# Patient Record
Sex: Female | Born: 1945 | Race: White | Hispanic: No | Smoking: Never smoker
Health system: Southern US, Community
[De-identification: ages and names within clinical notes are randomized; demographics above are authoritative.]

## PROBLEM LIST (undated history)

## (undated) DIAGNOSIS — I340 Nonrheumatic mitral (valve) insufficiency: Secondary | ICD-10-CM

## (undated) DIAGNOSIS — M4800 Spinal stenosis, site unspecified: Secondary | ICD-10-CM

## (undated) DIAGNOSIS — E785 Hyperlipidemia, unspecified: Secondary | ICD-10-CM

## (undated) DIAGNOSIS — I739 Peripheral vascular disease, unspecified: Secondary | ICD-10-CM

## (undated) DIAGNOSIS — L409 Psoriasis, unspecified: Secondary | ICD-10-CM

## (undated) DIAGNOSIS — I10 Essential (primary) hypertension: Secondary | ICD-10-CM

## (undated) DIAGNOSIS — I5189 Other ill-defined heart diseases: Secondary | ICD-10-CM

## (undated) DIAGNOSIS — F419 Anxiety disorder, unspecified: Secondary | ICD-10-CM

## (undated) DIAGNOSIS — I272 Pulmonary hypertension, unspecified: Secondary | ICD-10-CM

## (undated) DIAGNOSIS — S0922XA Traumatic rupture of left ear drum, initial encounter: Secondary | ICD-10-CM

## (undated) DIAGNOSIS — U071 COVID-19: Secondary | ICD-10-CM

## (undated) HISTORY — PX: SHOULDER SURGERY: SHX246

## (undated) HISTORY — DX: Peripheral vascular disease, unspecified: I73.9

## (undated) HISTORY — DX: Pulmonary hypertension, unspecified: I27.20

## (undated) HISTORY — PX: VAGINAL HYSTERECTOMY: SUR661

## (undated) HISTORY — PX: KNEE ARTHROSCOPY: SUR90

## (undated) HISTORY — PX: ROTATOR CUFF REPAIR W/ DISTAL CLAVICLE EXCISION: SHX2365

## (undated) HISTORY — DX: Essential (primary) hypertension: I10

## (undated) HISTORY — DX: Nonrheumatic mitral (valve) insufficiency: I34.0

## (undated) HISTORY — DX: Other ill-defined heart diseases: I51.89

## (undated) HISTORY — DX: COVID-19: U07.1

## (undated) HISTORY — DX: Traumatic rupture of left ear drum, initial encounter: S09.22XA

## (undated) HISTORY — DX: Hyperlipidemia, unspecified: E78.5

## (undated) HISTORY — PX: KNEE SURGERY: SHX244

## (undated) HISTORY — PX: SKIN CANCER EXCISION: SHX779

## (undated) HISTORY — DX: Psoriasis, unspecified: L40.9

## (undated) HISTORY — DX: Anxiety disorder, unspecified: F41.9

## (undated) HISTORY — PX: MOHS SURGERY: SUR867

## (undated) HISTORY — DX: Spinal stenosis, site unspecified: M48.00

## (undated) HISTORY — PX: BUNIONECTOMY: SHX129

## (undated) HISTORY — PX: TOTAL KNEE ARTHROPLASTY: SHX125

## (undated) HISTORY — PX: BASAL CELL CARCINOMA EXCISION: SHX1214

---

## 1978-12-14 HISTORY — PX: ABDOMINAL HYSTERECTOMY: SHX81

## 1990-12-14 HISTORY — PX: BUNIONECTOMY: SHX129

## 2013-09-19 DIAGNOSIS — Z23 Encounter for immunization: Secondary | ICD-10-CM | POA: Diagnosis not present

## 2013-12-18 DIAGNOSIS — D1801 Hemangioma of skin and subcutaneous tissue: Secondary | ICD-10-CM | POA: Diagnosis not present

## 2013-12-18 DIAGNOSIS — Z85828 Personal history of other malignant neoplasm of skin: Secondary | ICD-10-CM | POA: Diagnosis not present

## 2013-12-18 DIAGNOSIS — L708 Other acne: Secondary | ICD-10-CM | POA: Diagnosis not present

## 2013-12-18 DIAGNOSIS — L819 Disorder of pigmentation, unspecified: Secondary | ICD-10-CM | POA: Diagnosis not present

## 2013-12-19 ENCOUNTER — Ambulatory Visit: Payer: Self-pay | Admitting: Internal Medicine

## 2014-01-05 ENCOUNTER — Ambulatory Visit (INDEPENDENT_AMBULATORY_CARE_PROVIDER_SITE_OTHER): Payer: Medicare Other | Admitting: Internal Medicine

## 2014-01-05 ENCOUNTER — Encounter: Payer: Self-pay | Admitting: Internal Medicine

## 2014-01-05 VITALS — BP 128/74 | HR 59 | Temp 97.9°F | Ht 60.5 in | Wt 161.5 lb

## 2014-01-05 DIAGNOSIS — K219 Gastro-esophageal reflux disease without esophagitis: Secondary | ICD-10-CM

## 2014-01-05 DIAGNOSIS — E785 Hyperlipidemia, unspecified: Secondary | ICD-10-CM | POA: Diagnosis not present

## 2014-01-05 DIAGNOSIS — Z23 Encounter for immunization: Secondary | ICD-10-CM

## 2014-01-05 DIAGNOSIS — I10 Essential (primary) hypertension: Secondary | ICD-10-CM | POA: Diagnosis not present

## 2014-01-05 DIAGNOSIS — Z780 Asymptomatic menopausal state: Secondary | ICD-10-CM

## 2014-01-05 DIAGNOSIS — E669 Obesity, unspecified: Secondary | ICD-10-CM | POA: Diagnosis not present

## 2014-01-05 DIAGNOSIS — Z1239 Encounter for other screening for malignant neoplasm of breast: Secondary | ICD-10-CM

## 2014-01-05 LAB — COMPREHENSIVE METABOLIC PANEL
ALT: 19 U/L (ref 0–35)
AST: 21 U/L (ref 0–37)
Albumin: 4.1 g/dL (ref 3.5–5.2)
Alkaline Phosphatase: 61 U/L (ref 39–117)
BUN: 18 mg/dL (ref 6–23)
CALCIUM: 9.2 mg/dL (ref 8.4–10.5)
CHLORIDE: 101 meq/L (ref 96–112)
CO2: 29 meq/L (ref 19–32)
CREATININE: 1 mg/dL (ref 0.4–1.2)
GFR: 57.94 mL/min — AB (ref >60.00)
Glucose, Bld: 89 mg/dL (ref 70–99)
Potassium: 4.5 mEq/L (ref 3.5–5.1)
Sodium: 137 mEq/L (ref 135–145)
Total Bilirubin: 0.6 mg/dL (ref 0.3–1.2)
Total Protein: 7.8 g/dL (ref 6.0–8.3)

## 2014-01-05 LAB — LIPID PANEL
Cholesterol: 212 mg/dL — ABNORMAL HIGH (ref 0–200)
HDL: 46.1 mg/dL (ref >39.00)
TRIGLYCERIDES: 267 mg/dL — AB (ref 0.0–149.0)
Total CHOL/HDL Ratio: 5
VLDL: 53.4 mg/dL — ABNORMAL HIGH (ref 0.0–40.0)

## 2014-01-05 LAB — CBC
HCT: 37.3 % (ref 36.0–46.0)
HEMOGLOBIN: 12.6 g/dL (ref 12.0–15.0)
MCHC: 33.7 g/dL (ref 30.0–36.0)
MCV: 97.5 fl (ref 78.0–100.0)
PLATELETS: 112 10*3/uL — AB (ref 150.0–400.0)
RBC: 3.83 Mil/uL — ABNORMAL LOW (ref 3.87–5.11)
RDW: 12.8 % (ref 11.5–14.6)
WBC: 8.1 10*3/uL (ref 4.5–10.5)

## 2014-01-05 LAB — LDL CHOLESTEROL, DIRECT: Direct LDL: 136.2 mg/dL

## 2014-01-05 NOTE — Assessment & Plan Note (Signed)
Well controlled on current therapy Will check CBC and CMET today 

## 2014-01-05 NOTE — Progress Notes (Signed)
Pre-visit discussion using our clinic review tool. No additional management support is needed unless otherwise documented below in the visit note.  

## 2014-01-05 NOTE — Addendum Note (Signed)
Addended by: Lurlean Nanny on: 01/05/2014 12:34 PM   Modules accepted: Orders

## 2014-01-05 NOTE — Assessment & Plan Note (Signed)
On simvistatin and tolerating it well Will check lipid profile and CMET today Continue to work on diet and exercise

## 2014-01-05 NOTE — Assessment & Plan Note (Signed)
Well controlled on Nexium Medication refilled today

## 2014-01-05 NOTE — Assessment & Plan Note (Signed)
Encouraged her to work on diet and exercise 

## 2014-01-05 NOTE — Progress Notes (Signed)
HPI  Pt presents to the clinic today to establish care. She moved from [place] last march. She is transferring care from [facility], [facility], [place].  Flu: 09/13/2013 Tetanus: unsure of date Pneumovax: 2013 Zostovax: never Pap Smear: 2009-normal Mammogram: 2009- normal Colon Screening: never Bone Density: never Eye Doctor: as needed Dentist: no- dentures  Past Medical History  Diagnosis Date  . Hypertension   . Hyperlipidemia   . Anxiety     Current Outpatient Prescriptions  Medication Sig Dispense Refill  . aspirin 81 MG tablet Take 81 mg by mouth daily.      Marland Kitchen atenolol (TENORMIN) 25 MG tablet Take 25 mg by mouth daily.      Marland Kitchen esomeprazole (NEXIUM) 40 MG capsule Take 40 mg by mouth daily at 12 noon.      Marland Kitchen ibuprofen (ADVIL,MOTRIN) 800 MG tablet Take 800 mg by mouth every 8 (eight) hours as needed.      Marland Kitchen LORazepam (ATIVAN) 0.5 MG tablet Take 0.5 mg by mouth every 8 (eight) hours.      . meloxicam (MOBIC) 7.5 MG tablet Take 7.5 mg by mouth 2 (two) times daily as needed for pain.      . simvastatin (ZOCOR) 20 MG tablet Take 20 mg by mouth daily.      . traMADol (ULTRAM) 50 MG tablet Take 100 mg by mouth every 6 (six) hours as needed.      . triamterene-hydrochlorothiazide (DYAZIDE) 37.5-25 MG per capsule Take 1 capsule by mouth daily.       No current facility-administered medications for this visit.    No Known Allergies  Family History  Problem Relation Age of Onset  . Cancer Mother   . Hypertension Sister     History   Social History  . Marital Status: Widowed    Spouse Name: N/A    Number of Children: N/A  . Years of Education: N/A   Occupational History  . Not on file.   Social History Main Topics  . Smoking status: Never Smoker   . Smokeless tobacco: Never Used  . Alcohol Use: No  . Drug Use: No  . Sexual Activity: Not on file   Other Topics Concern  . Not on file   Social History Narrative  . No narrative on file     ROS:  Constitutional: Denies fever, malaise, fatigue, headache or abrupt weight changes.  HEENT: Denies eye pain, eye redness, ear pain, ringing in the ears, wax buildup, runny nose, nasal congestion, bloody nose, or sore throat. Respiratory: Denies difficulty breathing, shortness of breath, cough or sputum production.   Cardiovascular: Denies chest pain, chest tightness, palpitations or swelling in the hands or feet.  Gastrointestinal: Denies abdominal pain, bloating, constipation, diarrhea or blood in the stool.  GU: Denies frequency, urgency, pain with urination, blood in urine, odor or discharge. Musculoskeletal: Pt reports multiple joint pains. Denies decrease in range of motion, difficulty with gait, muscle pain.  Skin: Denies redness, rashes, lesions or ulcercations.  Neurological: Denies dizziness, difficulty with memory, difficulty with speech or problems with balance and coordination.   No other specific complaints in a complete review of systems (except as listed in HPI above).  PE:  BP 128/74  Pulse 59  Temp(Src) 97.9 F (36.6 C) (Oral)  Ht 5' 0.5" (1.537 m)  Wt 161 lb 8 oz (73.256 kg)  BMI 31.01 kg/m2  SpO2 98% Wt Readings from Last 3 Encounters:  01/05/14 161 lb 8 oz (73.256 kg)  General: Appears her stated age, overweight well developed, well nourished in NAD. Cardiovascular: Normal rate and rhythm. S1,S2 noted.  No murmur, rubs or gallops noted. No JVD or BLE edema. No carotid bruits noted. Pulmonary/Chest: Normal effort and positive vesicular breath sounds. No respiratory distress. No wheezes, rales or ronchi noted.    Assessment and Plan:  Preventative Health Maintenance:  Advised her to work on diet and exercise Td given today Referral place for mammogram and bone density exam She declines setting up her colonoscopy Encouraged her to make an appt with an eye doctor She will call her insurance company to find out about the shingles vaccine  RTC in  6 months or sooner if needed

## 2014-01-05 NOTE — Patient Instructions (Signed)

## 2014-01-08 ENCOUNTER — Telehealth: Payer: Self-pay | Admitting: Internal Medicine

## 2014-01-08 NOTE — Telephone Encounter (Signed)
Relevant patient education assigned to patient using Emmi. ° °

## 2014-01-19 ENCOUNTER — Other Ambulatory Visit: Payer: Self-pay | Admitting: Internal Medicine

## 2014-01-19 ENCOUNTER — Other Ambulatory Visit: Payer: Self-pay

## 2014-01-19 DIAGNOSIS — Z1231 Encounter for screening mammogram for malignant neoplasm of breast: Secondary | ICD-10-CM

## 2014-01-19 NOTE — Telephone Encounter (Signed)
Pt left v/m requesting refill Nexium to [facility]; [name] NP has never prescribed. Pt had gotten from [place] doctor in past. Pt request cb when refilled.

## 2014-01-22 MED ORDER — ESOMEPRAZOLE MAGNESIUM 40 MG PO CPDR: 40.0000 mg | DELAYED_RELEASE_CAPSULE | Freq: Every day | ORAL | Status: DC

## 2014-01-22 NOTE — Telephone Encounter (Signed)
Ok to refill 

## 2014-02-08 ENCOUNTER — Ambulatory Visit: Payer: Medicare Other

## 2014-02-08 ENCOUNTER — Other Ambulatory Visit: Payer: Medicare Other

## 2014-02-28 ENCOUNTER — Ambulatory Visit
Admission: RE | Admit: 2014-02-28 | Discharge: 2014-02-28 | Disposition: A | Discharge status: Home / Self Care | Payer: Medicare Other | Source: Ambulatory Visit | Attending: Internal Medicine | Admitting: Internal Medicine

## 2014-02-28 DIAGNOSIS — Z1231 Encounter for screening mammogram for malignant neoplasm of breast: Secondary | ICD-10-CM | POA: Diagnosis not present

## 2014-02-28 DIAGNOSIS — M899 Disorder of bone, unspecified: Secondary | ICD-10-CM | POA: Diagnosis not present

## 2014-02-28 DIAGNOSIS — Z780 Asymptomatic menopausal state: Secondary | ICD-10-CM

## 2014-03-20 ENCOUNTER — Telehealth: Payer: Self-pay

## 2014-03-20 ENCOUNTER — Other Ambulatory Visit: Payer: Self-pay

## 2014-03-20 MED ORDER — ATENOLOL 25 MG PO TABS: 25.0000 mg | ORAL_TABLET | Freq: Every day | ORAL | Status: DC

## 2014-03-20 MED ORDER — SIMVASTATIN 20 MG PO TABS: 20.0000 mg | ORAL_TABLET | Freq: Every day | ORAL | Status: DC

## 2014-03-20 NOTE — Telephone Encounter (Signed)
Pt called requesting medication refill--you have never prescribed these medications and wanted verification that it is okay to refill--pt had labs and OV with you 12/2013--please advise

## 2014-03-20 NOTE — Telephone Encounter (Signed)
Called to let pt know that her bone density scan showed mild bone loss and she needs to start OTC Vitamin D 2000 units daily and calcium supplement daily

## 2014-04-05 DIAGNOSIS — H2510 Age-related nuclear cataract, unspecified eye: Secondary | ICD-10-CM | POA: Diagnosis not present

## 2014-05-11 ENCOUNTER — Other Ambulatory Visit: Payer: Self-pay | Admitting: Internal Medicine

## 2014-05-14 DIAGNOSIS — M1710 Unilateral primary osteoarthritis, unspecified knee: Secondary | ICD-10-CM | POA: Diagnosis not present

## 2014-05-14 DIAGNOSIS — M179 Osteoarthritis of knee, unspecified: Secondary | ICD-10-CM | POA: Insufficient documentation

## 2014-05-14 DIAGNOSIS — IMO0002 Reserved for concepts with insufficient information to code with codable children: Secondary | ICD-10-CM | POA: Diagnosis not present

## 2014-05-14 DIAGNOSIS — M25569 Pain in unspecified knee: Secondary | ICD-10-CM | POA: Diagnosis not present

## 2014-05-16 DIAGNOSIS — H2510 Age-related nuclear cataract, unspecified eye: Secondary | ICD-10-CM | POA: Diagnosis not present

## 2014-05-16 DIAGNOSIS — Q141 Congenital malformation of retina: Secondary | ICD-10-CM | POA: Diagnosis not present

## 2014-05-16 DIAGNOSIS — H18419 Arcus senilis, unspecified eye: Secondary | ICD-10-CM | POA: Diagnosis not present

## 2014-05-16 DIAGNOSIS — I10 Essential (primary) hypertension: Secondary | ICD-10-CM | POA: Diagnosis not present

## 2014-05-17 ENCOUNTER — Encounter (INDEPENDENT_AMBULATORY_CARE_PROVIDER_SITE_OTHER): Payer: Medicare Other | Admitting: Ophthalmology

## 2014-05-17 DIAGNOSIS — I10 Essential (primary) hypertension: Secondary | ICD-10-CM | POA: Diagnosis not present

## 2014-05-17 DIAGNOSIS — H35039 Hypertensive retinopathy, unspecified eye: Secondary | ICD-10-CM

## 2014-05-17 DIAGNOSIS — H43819 Vitreous degeneration, unspecified eye: Secondary | ICD-10-CM | POA: Diagnosis not present

## 2014-05-17 DIAGNOSIS — H4420 Degenerative myopia, unspecified eye: Secondary | ICD-10-CM

## 2014-05-17 DIAGNOSIS — H2510 Age-related nuclear cataract, unspecified eye: Secondary | ICD-10-CM

## 2014-06-11 DIAGNOSIS — IMO0002 Reserved for concepts with insufficient information to code with codable children: Secondary | ICD-10-CM | POA: Diagnosis not present

## 2014-06-11 DIAGNOSIS — M1710 Unilateral primary osteoarthritis, unspecified knee: Secondary | ICD-10-CM | POA: Diagnosis not present

## 2014-06-13 DIAGNOSIS — H2510 Age-related nuclear cataract, unspecified eye: Secondary | ICD-10-CM | POA: Diagnosis not present

## 2014-06-13 DIAGNOSIS — H269 Unspecified cataract: Secondary | ICD-10-CM | POA: Diagnosis not present

## 2014-06-14 DIAGNOSIS — Z961 Presence of intraocular lens: Secondary | ICD-10-CM | POA: Diagnosis not present

## 2014-06-14 DIAGNOSIS — H25049 Posterior subcapsular polar age-related cataract, unspecified eye: Secondary | ICD-10-CM | POA: Diagnosis not present

## 2014-06-14 DIAGNOSIS — H2510 Age-related nuclear cataract, unspecified eye: Secondary | ICD-10-CM | POA: Diagnosis not present

## 2014-06-14 DIAGNOSIS — H25019 Cortical age-related cataract, unspecified eye: Secondary | ICD-10-CM | POA: Diagnosis not present

## 2014-06-18 DIAGNOSIS — M1710 Unilateral primary osteoarthritis, unspecified knee: Secondary | ICD-10-CM | POA: Diagnosis not present

## 2014-06-18 DIAGNOSIS — IMO0002 Reserved for concepts with insufficient information to code with codable children: Secondary | ICD-10-CM | POA: Diagnosis not present

## 2014-06-20 DIAGNOSIS — H269 Unspecified cataract: Secondary | ICD-10-CM | POA: Diagnosis not present

## 2014-06-20 DIAGNOSIS — H2510 Age-related nuclear cataract, unspecified eye: Secondary | ICD-10-CM | POA: Diagnosis not present

## 2014-06-25 DIAGNOSIS — IMO0002 Reserved for concepts with insufficient information to code with codable children: Secondary | ICD-10-CM | POA: Diagnosis not present

## 2014-06-25 DIAGNOSIS — M1710 Unilateral primary osteoarthritis, unspecified knee: Secondary | ICD-10-CM | POA: Diagnosis not present

## 2014-07-11 DIAGNOSIS — Z23 Encounter for immunization: Secondary | ICD-10-CM | POA: Diagnosis not present

## 2014-08-07 ENCOUNTER — Other Ambulatory Visit: Payer: Self-pay | Admitting: Internal Medicine

## 2014-08-08 NOTE — Telephone Encounter (Signed)
Pt est care with you 12/2013--no upcoming appts as i made a not that pt needs to make appt--you noted to f/u 6 months--this medication has never been prescribed by you please advise

## 2014-08-08 NOTE — Telephone Encounter (Signed)
Agree, needs follow up first

## 2014-08-10 NOTE — Telephone Encounter (Signed)
Pt stated she had surgery on her eyes and other things have come up so she forgot to make appt--pt made f/u appt for 08/13/14--Monday--i told her to make sure she is fasting just in case you wanted blood work as she has been taking the fish oil as you directed

## 2014-08-13 ENCOUNTER — Ambulatory Visit (INDEPENDENT_AMBULATORY_CARE_PROVIDER_SITE_OTHER): Payer: Medicare Other | Admitting: Internal Medicine

## 2014-08-13 ENCOUNTER — Encounter: Payer: Self-pay | Admitting: Internal Medicine

## 2014-08-13 VITALS — BP 140/78 | HR 76 | Temp 98.2°F | Wt 169.0 lb

## 2014-08-13 DIAGNOSIS — K219 Gastro-esophageal reflux disease without esophagitis: Secondary | ICD-10-CM

## 2014-08-13 DIAGNOSIS — F411 Generalized anxiety disorder: Secondary | ICD-10-CM | POA: Diagnosis not present

## 2014-08-13 DIAGNOSIS — Z23 Encounter for immunization: Secondary | ICD-10-CM | POA: Diagnosis not present

## 2014-08-13 DIAGNOSIS — E785 Hyperlipidemia, unspecified: Secondary | ICD-10-CM | POA: Diagnosis not present

## 2014-08-13 DIAGNOSIS — I10 Essential (primary) hypertension: Secondary | ICD-10-CM | POA: Diagnosis not present

## 2014-08-13 DIAGNOSIS — E669 Obesity, unspecified: Secondary | ICD-10-CM

## 2014-08-13 LAB — COMPREHENSIVE METABOLIC PANEL
ALT: 23 U/L (ref 0–35)
AST: 22 U/L (ref 0–37)
Albumin: 3.8 g/dL (ref 3.5–5.2)
Alkaline Phosphatase: 55 U/L (ref 39–117)
BUN: 14 mg/dL (ref 6–23)
CALCIUM: 9.1 mg/dL (ref 8.4–10.5)
CHLORIDE: 100 meq/L (ref 96–112)
CO2: 24 meq/L (ref 19–32)
CREATININE: 0.9 mg/dL (ref 0.4–1.2)
GFR: 66.92 mL/min (ref >60.00)
Glucose, Bld: 90 mg/dL (ref 70–99)
Potassium: 4.4 mEq/L (ref 3.5–5.1)
Sodium: 135 mEq/L (ref 135–145)
Total Bilirubin: 0.5 mg/dL (ref 0.2–1.2)
Total Protein: 7.6 g/dL (ref 6.0–8.3)

## 2014-08-13 LAB — LIPID PANEL
CHOLESTEROL: 199 mg/dL (ref 0–200)
HDL: 43.4 mg/dL (ref >39.00)
LDL Cholesterol: 119 mg/dL — ABNORMAL HIGH (ref 0–99)
NonHDL: 155.6
TRIGLYCERIDES: 184 mg/dL — AB (ref 0.0–149.0)
Total CHOL/HDL Ratio: 5
VLDL: 36.8 mg/dL (ref 0.0–40.0)

## 2014-08-13 LAB — CBC
HEMATOCRIT: 36.2 % (ref 36.0–46.0)
HEMOGLOBIN: 12.2 g/dL (ref 12.0–15.0)
MCHC: 33.7 g/dL (ref 30.0–36.0)
MCV: 98.7 fl (ref 78.0–100.0)
PLATELETS: 191 10*3/uL (ref 150.0–400.0)
RBC: 3.67 Mil/uL — AB (ref 3.87–5.11)
RDW: 12.6 % (ref 11.5–15.5)
WBC: 6.7 10*3/uL (ref 4.0–10.5)

## 2014-08-13 MED ORDER — LORAZEPAM 0.5 MG PO TABS: 0.5000 mg | ORAL_TABLET | Freq: Three times a day (TID) | ORAL | Status: DC

## 2014-08-13 MED ORDER — TRIAMTERENE-HCTZ 37.5-25 MG PO CAPS: 1.0000 | ORAL_CAPSULE | Freq: Every day | ORAL | Status: DC

## 2014-08-13 NOTE — Progress Notes (Signed)
Pre visit review using our clinic review tool, if applicable. No additional management support is needed unless otherwise documented below in the visit note. 

## 2014-08-13 NOTE — Assessment & Plan Note (Signed)
Well controlled on atenolol and dyazide Medications refilled today per request Will repeat CBC and CMET today

## 2014-08-13 NOTE — Assessment & Plan Note (Signed)
Slightly worse but se reports she has started taking the fish oil and been watching her diet Will repeat lipid profile and CMET today If still not at goal, will increase simvastatin to 40 mg daily

## 2014-08-13 NOTE — Assessment & Plan Note (Signed)
Recent increase in weight Encourage her to pay close attention to her diet Also advised her to start exercising at least 3-4 days per week for at least 30 minutes

## 2014-08-13 NOTE — Progress Notes (Signed)
Subjective:    Patient ID: Kristen Powell, female    DOB: 1946/10/17, 68 y.o.   MRN: [id number]  HPI  Pt presents to the clinic today for 6 month followup. She will need a flu shot today.  HTN: Well controlled on atenolol and dyazide. BP slightly elevated today. She reports that she has not taken her blood pressure medication this morning.  HLD: Labs from 12/2013 showed elevated triglycerides and slightly elevated LDL despite simvistatin. She was encouraged to consume a low fat, low cholesterol diet and add fish oil to her regimen.  GERD: No issues on nexium.  Obesity: She has gained 7.5 lbs in the last 7 months.  Anxiety: Takes 1 at night to help her sleeps. May take 1/2 tab during the day if really anxious.  Review of Systems      Past Medical History  Diagnosis Date  . Hypertension   . Hyperlipidemia   . Anxiety     Current Outpatient Prescriptions  Medication Sig Dispense Refill  . aspirin 81 MG tablet Take 81 mg by mouth daily.      Marland Kitchen atenolol (TENORMIN) 25 MG tablet Take 1 tablet (25 mg total) by mouth daily.  30 tablet  5  . ibuprofen (ADVIL,MOTRIN) 800 MG tablet Take 800 mg by mouth every 8 (eight) hours as needed.      Marland Kitchen LORazepam (ATIVAN) 0.5 MG tablet Take 0.5 mg by mouth every 8 (eight) hours.      . meloxicam (MOBIC) 7.5 MG tablet Take 7.5 mg by mouth 2 (two) times daily as needed for pain.      Marland Kitchen NEXIUM 40 MG capsule TAKE 1 CAPSULE (40 MG TOTAL) BY MOUTH DAILY AT 12 NOON.  30 capsule  3  . simvastatin (ZOCOR) 20 MG tablet Take 1 tablet (20 mg total) by mouth daily.  30 tablet  5  . traMADol (ULTRAM) 50 MG tablet Take 100 mg by mouth every 6 (six) hours as needed.      . triamterene-hydrochlorothiazide (DYAZIDE) 37.5-25 MG per capsule Take 1 capsule by mouth daily.       No current facility-administered medications for this visit.    No Known Allergies  Family History  Problem Relation Age of Onset  . Cancer Mother     lung  . Hypertension Sister      History   Social History  . Marital Status: Widowed    Spouse Name: N/A    Number of Children: N/A  . Years of Education: N/A   Occupational History  . Not on file.   Social History Main Topics  . Smoking status: Never Smoker   . Smokeless tobacco: Never Used  . Alcohol Use: No  . Drug Use: No  . Sexual Activity: Not on file   Other Topics Concern  . Not on file   Social History Narrative  . No narrative on file     Constitutional: Denies fever, malaise, fatigue, headache or abrupt weight changes.  Respiratory: Denies difficulty breathing, shortness of breath, cough or sputum production.   Cardiovascular: Denies chest pain, chest tightness, palpitations or swelling in the hands or feet.  Gastrointestinal: Denies abdominal pain, bloating, constipation, diarrhea or blood in the stool.  Neurological: Denies dizziness, difficulty with memory, difficulty with speech or problems with balance and coordination.   No other specific complaints in a complete review of systems (except as listed in HPI above).  Objective:   Physical Exam   BP 140/78  Pulse  76  Temp(Src) 98.2 F (36.8 C) (Oral)  Wt 169 lb (76.658 kg)  SpO2 97% Wt Readings from Last 3 Encounters:  08/13/14 169 lb (76.658 kg)  01/05/14 161 lb 8 oz (73.256 kg)    General: Appears her stated age, well developed, well nourished in NAD. Cardiovascular: Normal rate and rhythm. S1,S2 noted.  No murmur, rubs or gallops noted.  Pulmonary/Chest: Normal effort and positive vesicular breath sounds. No respiratory distress. No wheezes, rales or ronchi noted.  Abdomen: Soft and nontender. Normal bowel sounds, no bruits noted. No distention or masses noted. Liver, spleen and kidneys non palpable. Neurological: Alert and oriented.  Psychiatric: Mood and affect normal. Behavior is normal. Judgment and thought content normal.    BMET    Component Value Date/Time   NA 137 01/05/2014 1030   K 4.5 01/05/2014 1030   CL  101 01/05/2014 1030   CO2 29 01/05/2014 1030   GLUCOSE 89 01/05/2014 1030   BUN 18 01/05/2014 1030   CREATININE 1.0 01/05/2014 1030   CALCIUM 9.2 01/05/2014 1030    Lipid Panel     Component Value Date/Time   CHOL 212* 01/05/2014 1030   TRIG 267.0* 01/05/2014 1030   HDL 46.10 01/05/2014 1030   CHOLHDL 5 01/05/2014 1030   VLDL 53.4* 01/05/2014 1030    CBC    Component Value Date/Time   WBC 8.1 01/05/2014 1030   RBC 3.83* 01/05/2014 1030   HGB 12.6 01/05/2014 1030   HCT 37.3 01/05/2014 1030   PLT 112.0* 01/05/2014 1030   MCV 97.5 01/05/2014 1030   MCHC 33.7 01/05/2014 1030   RDW 12.8 01/05/2014 1030    Hgb A1C No results found for this basename: HGBA1C        Assessment & Plan:   Flu shot today  RTC in 6 months for follow up

## 2014-08-13 NOTE — Assessment & Plan Note (Signed)
Ativan refilled today She will complete UDS today

## 2014-08-13 NOTE — Addendum Note (Signed)
Addended by: Lurlean Nanny on: 08/13/2014 10:03 AM   Modules accepted: Orders

## 2014-08-13 NOTE — Patient Instructions (Signed)
Fat and Cholesterol Control Diet Fat and cholesterol levels in your blood and organs are influenced by your diet. High levels of fat and cholesterol may lead to diseases of the heart, small and large blood vessels, gallbladder, liver, and pancreas. CONTROLLING FAT AND CHOLESTEROL WITH DIET Although exercise and lifestyle factors are important, your diet is key. That is because certain foods are known to raise cholesterol and others to lower it. The goal is to balance foods for their effect on cholesterol and more importantly, to replace saturated and trans fat with other types of fat, such as monounsaturated fat, polyunsaturated fat, and omega-3 fatty acids. On average, a person should consume no more than 15 to 17 g of saturated fat daily. Saturated and trans fats are considered "bad" fats, and they will raise LDL cholesterol. Saturated fats are primarily found in animal products such as meats, butter, and cream. However, that does not mean you need to give up all your favorite foods. Today, there are good tasting, low-fat, low-cholesterol substitutes for most of the things you like to eat. Choose low-fat or nonfat alternatives. Choose round or loin cuts of red meat. These types of cuts are lowest in fat and cholesterol. Chicken (without the skin), fish, veal, and ground turkey breast are great choices. Eliminate fatty meats, such as hot dogs and salami. Even shellfish have little or no saturated fat. Have a 3 oz (85 g) portion when you eat lean meat, poultry, or fish. Trans fats are also called "partially hydrogenated oils." They are oils that have been scientifically manipulated so that they are solid at room temperature resulting in a longer shelf life and improved taste and texture of foods in which they are added. Trans fats are found in stick margarine, some tub margarines, cookies, crackers, and baked goods.  When baking and cooking, oils are a great substitute for butter. The monounsaturated oils are  especially beneficial since it is believed they lower LDL and raise HDL. The oils you should avoid entirely are saturated tropical oils, such as coconut and palm.  Remember to eat a lot from food groups that are naturally free of saturated and trans fat, including fish, fruit, vegetables, beans, grains (barley, rice, couscous, bulgur wheat), and pasta (without cream sauces).  IDENTIFYING FOODS THAT LOWER FAT AND CHOLESTEROL  Soluble fiber may lower your cholesterol. This type of fiber is found in fruits such as apples, vegetables such as broccoli, potatoes, and carrots, legumes such as beans, peas, and lentils, and grains such as barley. Foods fortified with plant sterols (phytosterol) may also lower cholesterol. You should eat at least 2 g per day of these foods for a cholesterol lowering effect.  Read package labels to identify low-saturated fats, trans fat free, and low-fat foods at the supermarket. Select cheeses that have only 2 to 3 g saturated fat per ounce. Use a heart-healthy tub margarine that is free of trans fats or partially hydrogenated oil. When buying baked goods (cookies, crackers), avoid partially hydrogenated oils. Breads and muffins should be made from whole grains (whole-wheat or whole oat flour, instead of "flour" or "enriched flour"). Buy non-creamy canned soups with reduced salt and no added fats.  FOOD PREPARATION TECHNIQUES  Never deep-fry. If you must fry, either stir-fry, which uses very little fat, or use non-stick cooking sprays. When possible, broil, bake, or roast meats, and steam vegetables. Instead of putting butter or margarine on vegetables, use lemon and herbs, applesauce, and cinnamon (for squash and sweet potatoes). Use nonfat   yogurt, salsa, and low-fat dressings for salads.  LOW-SATURATED FAT / LOW-FAT FOOD SUBSTITUTES Meats / Saturated Fat (g)  Avoid: Steak, marbled (3 oz/85 g) / 11 g  Choose: Steak, lean (3 oz/85 g) / 4 g  Avoid: Hamburger (3 oz/85 g) / 7  g  Choose: Hamburger, lean (3 oz/85 g) / 5 g  Avoid: Ham (3 oz/85 g) / 6 g  Choose: Ham, lean cut (3 oz/85 g) / 2.4 g  Avoid: Chicken, with skin, dark meat (3 oz/85 g) / 4 g  Choose: Chicken, skin removed, dark meat (3 oz/85 g) / 2 g  Avoid: Chicken, with skin, light meat (3 oz/85 g) / 2.5 g  Choose: Chicken, skin removed, light meat (3 oz/85 g) / 1 g Dairy / Saturated Fat (g)  Avoid: Whole milk (1 cup) / 5 g  Choose: Low-fat milk, 2% (1 cup) / 3 g  Choose: Low-fat milk, 1% (1 cup) / 1.5 g  Choose: Skim milk (1 cup) / 0.3 g  Avoid: Hard cheese (1 oz/28 g) / 6 g  Choose: Skim milk cheese (1 oz/28 g) / 2 to 3 g  Avoid: Cottage cheese, 4% fat (1 cup) / 6.5 g  Choose: Low-fat cottage cheese, 1% fat (1 cup) / 1.5 g  Avoid: Ice cream (1 cup) / 9 g  Choose: Sherbet (1 cup) / 2.5 g  Choose: Nonfat frozen yogurt (1 cup) / 0.3 g  Choose: Frozen fruit bar / trace  Avoid: Whipped cream (1 tbs) / 3.5 g  Choose: Nondairy whipped topping (1 tbs) / 1 g Condiments / Saturated Fat (g)  Avoid: Mayonnaise (1 tbs) / 2 g  Choose: Low-fat mayonnaise (1 tbs) / 1 g  Avoid: Butter (1 tbs) / 7 g  Choose: Extra light margarine (1 tbs) / 1 g  Avoid: Coconut oil (1 tbs) / 11.8 g  Choose: Olive oil (1 tbs) / 1.8 g  Choose: Corn oil (1 tbs) / 1.7 g  Choose: Safflower oil (1 tbs) / 1.2 g  Choose: Sunflower oil (1 tbs) / 1.4 g  Choose: Soybean oil (1 tbs) / 2.4 g  Choose: Canola oil (1 tbs) / 1 g Document Released: 11/30/2005 Document Revised: 03/27/2013 Document Reviewed: 02/28/2014 Exit[employer] Patient Information 2015 [employer]. This information is not intended to replace advice given to you by your health care provider. Make sure you discuss any questions you have with your health care provider.  

## 2014-08-13 NOTE — Assessment & Plan Note (Signed)
Continue Nexium for now Will check CMET No refills needed

## 2014-09-05 ENCOUNTER — Other Ambulatory Visit: Payer: Self-pay | Admitting: Internal Medicine

## 2014-09-05 NOTE — Telephone Encounter (Signed)
Rx called in to pharmacy. 

## 2014-09-05 NOTE — Telephone Encounter (Signed)
Pt called to see if we could expedite this request b/c she is going to have to leave out of town due to her daughter having emergency c-section and will be out of town for more than a week and will run out of meds. Please advise

## 2014-09-05 NOTE — Telephone Encounter (Signed)
Ok to fill early, this time only

## 2014-09-21 ENCOUNTER — Other Ambulatory Visit: Payer: Self-pay | Admitting: Internal Medicine

## 2014-10-01 ENCOUNTER — Other Ambulatory Visit: Payer: Self-pay | Admitting: *Deleted

## 2014-10-01 MED ORDER — ESOMEPRAZOLE MAGNESIUM 40 MG PO CPDR: DELAYED_RELEASE_CAPSULE | ORAL | Status: DC

## 2014-10-05 ENCOUNTER — Other Ambulatory Visit: Payer: Self-pay | Admitting: Internal Medicine

## 2014-10-05 NOTE — Telephone Encounter (Signed)
Last filled 09/05/14--last OV was 08/13/14 6 mth f/u--please advise if okay to refill

## 2014-10-07 NOTE — Telephone Encounter (Signed)
please call in.  Thanks.   

## 2014-10-08 NOTE — Telephone Encounter (Signed)
Rx called to pharmacy as instructed. 

## 2014-10-09 ENCOUNTER — Telehealth: Payer: Self-pay

## 2014-10-09 DIAGNOSIS — K219 Gastro-esophageal reflux disease without esophagitis: Secondary | ICD-10-CM

## 2014-10-09 NOTE — Telephone Encounter (Signed)
Ok to send in prilosec 20 mg BID # 60 5 refills

## 2014-10-09 NOTE — Telephone Encounter (Signed)
Pt left v/m requesting new 90 day rx for Omeprazole 20 mg taking one cap twice a day to [facility]; pt thinks ins will pay for omeprazole; pt could not afford to pick up Nexium this month; cost to pt was $113.00. Pt is in donut hole.Please advise.

## 2014-10-10 MED ORDER — ESOMEPRAZOLE MAGNESIUM 40 MG PO CPDR
DELAYED_RELEASE_CAPSULE | ORAL | Status: DC
Start: 2014-10-10 — End: 2015-01-22

## 2014-10-10 NOTE — Telephone Encounter (Signed)
Rx sent as instructed.

## 2014-10-10 NOTE — Addendum Note (Signed)
Addended by: Lurlean Nanny on: 10/10/2014 08:12 AM   Modules accepted: Orders

## 2014-10-12 ENCOUNTER — Telehealth: Payer: Self-pay

## 2014-10-12 MED ORDER — OMEPRAZOLE 40 MG PO CPDR: 40.0000 mg | DELAYED_RELEASE_CAPSULE | Freq: Every day | ORAL | Status: DC

## 2014-10-12 NOTE — Addendum Note (Signed)
Addended by: Lurlean Nanny on: 10/12/2014 11:41 AM   Modules accepted: Orders

## 2014-10-12 NOTE — Telephone Encounter (Signed)
Pt states she cannot afford Nexium and wants Prilosec sent instead--please advise if this is okay to send in

## 2014-10-12 NOTE — Telephone Encounter (Signed)
Ok to send in prilosec 40 mg daily

## 2014-10-12 NOTE — Telephone Encounter (Signed)
Rx sent through e-scribe  

## 2014-10-15 MED ORDER — OMEPRAZOLE 20 MG PO CPDR
20.0000 mg | DELAYED_RELEASE_CAPSULE | Freq: Two times a day (BID) | ORAL | Status: DC
Start: 2014-10-15 — End: 2015-01-05

## 2014-10-15 NOTE — Telephone Encounter (Signed)
Ok to send in prilosec 20 mg BID # 60 2 refills

## 2014-10-15 NOTE — Addendum Note (Signed)
Addended by: Lurlean Nanny on: 10/15/2014 11:43 AM   Modules accepted: Orders, Medications

## 2014-10-15 NOTE — Telephone Encounter (Signed)
Pt left v/m; for ins coverage ins is requiring omeprazole 20 mg taking one twice a day to [facility]. Pt request cb when completed.

## 2014-10-17 ENCOUNTER — Telehealth: Payer: Self-pay | Admitting: *Deleted

## 2014-10-17 ENCOUNTER — Other Ambulatory Visit: Payer: Self-pay

## 2014-10-17 MED ORDER — SIMVASTATIN 20 MG PO TABS: 20.0000 mg | ORAL_TABLET | Freq: Every day | ORAL | Status: DC

## 2014-10-17 NOTE — Telephone Encounter (Signed)
Rx has already been filled. 

## 2014-10-17 NOTE — Telephone Encounter (Signed)
Ok to refill zocor

## 2014-10-17 NOTE — Telephone Encounter (Signed)
Left msg on triage pt is needing refill on her simvastatin 20 mg. Pt see [name] forwarding msg to [name]

## 2014-10-31 ENCOUNTER — Other Ambulatory Visit: Payer: Self-pay | Admitting: Family Medicine

## 2014-11-01 NOTE — Telephone Encounter (Signed)
Can we call her and verify how often she is actually having to take it. If she is taking it multiple times daily, she needs an OV to discuss alternative treatment options.

## 2014-11-01 NOTE — Telephone Encounter (Signed)
Last filled #30 1 every 8 hours as needed--please advise

## 2014-11-01 NOTE — Telephone Encounter (Signed)
Pt states she takes 1 pill every night and sometimes she take 1/2 pill during the day--but not everyday--she stated she has some left she just did not know if you were going to be in office to refill for next week--please advise

## 2014-11-02 NOTE — Telephone Encounter (Signed)
Ok to phone in, change directions to 1 every 12 hours prn and give # 45, no refills

## 2014-11-06 ENCOUNTER — Other Ambulatory Visit: Payer: Self-pay | Admitting: Internal Medicine

## 2014-11-06 ENCOUNTER — Telehealth: Payer: Self-pay | Admitting: Internal Medicine

## 2014-11-06 NOTE — Telephone Encounter (Signed)
Pt is calling in ref to her refill for lorazepam. She requests a c/b. Thank youl

## 2014-11-06 NOTE — Telephone Encounter (Signed)
Rx called in to pharmacy. 

## 2014-11-12 NOTE — Telephone Encounter (Signed)
Rx called into pharmacy on 11/06/2014

## 2014-11-28 DIAGNOSIS — M1712 Unilateral primary osteoarthritis, left knee: Secondary | ICD-10-CM | POA: Diagnosis not present

## 2014-12-04 ENCOUNTER — Other Ambulatory Visit: Payer: Self-pay

## 2014-12-04 MED ORDER — LORAZEPAM 0.5 MG PO TABS: 0.5000 mg | ORAL_TABLET | Freq: Two times a day (BID) | ORAL | Status: DC | PRN

## 2014-12-04 NOTE — Telephone Encounter (Signed)
Pt left v/m requesting refill lorazepam to [facility]. Pt request cb when refilled; needs to pick up by 12/06/14.pt last seen 08/13/14.

## 2014-12-04 NOTE — Telephone Encounter (Signed)
ativan

## 2014-12-04 NOTE — Telephone Encounter (Signed)
Rx called in as directed.   

## 2014-12-04 NOTE — Telephone Encounter (Signed)
Ok to phone in xanax, will need CSA and UDS

## 2014-12-04 NOTE — Telephone Encounter (Signed)
Pt is aware and states she will come in tomorrow to sign a controlled substance contract

## 2014-12-04 NOTE — Telephone Encounter (Signed)
Phone in xanax or ativan?

## 2014-12-05 ENCOUNTER — Encounter: Payer: Self-pay | Admitting: *Deleted

## 2014-12-05 DIAGNOSIS — Z79899 Other long term (current) drug therapy: Secondary | ICD-10-CM | POA: Diagnosis not present

## 2014-12-17 DIAGNOSIS — L814 Other melanin hyperpigmentation: Secondary | ICD-10-CM | POA: Diagnosis not present

## 2014-12-17 DIAGNOSIS — Z85828 Personal history of other malignant neoplasm of skin: Secondary | ICD-10-CM | POA: Diagnosis not present

## 2014-12-17 DIAGNOSIS — L821 Other seborrheic keratosis: Secondary | ICD-10-CM | POA: Diagnosis not present

## 2014-12-17 DIAGNOSIS — D1801 Hemangioma of skin and subcutaneous tissue: Secondary | ICD-10-CM | POA: Diagnosis not present

## 2014-12-26 ENCOUNTER — Encounter: Payer: Self-pay | Admitting: Internal Medicine

## 2015-01-03 ENCOUNTER — Other Ambulatory Visit: Payer: Self-pay | Admitting: Internal Medicine

## 2015-01-03 NOTE — Telephone Encounter (Signed)
Last filled 12/04/2014--please advise

## 2015-01-03 NOTE — Telephone Encounter (Signed)
Rx called in to pharmacy. 

## 2015-01-03 NOTE — Telephone Encounter (Signed)
Ok to phone in ativan 

## 2015-01-05 ENCOUNTER — Other Ambulatory Visit: Payer: Self-pay | Admitting: Internal Medicine

## 2015-01-18 ENCOUNTER — Telehealth: Payer: Self-pay | Admitting: Internal Medicine

## 2015-01-18 NOTE — Telephone Encounter (Signed)
Spoke to pt--looks like she saw labs from 08/13/2014--but pt did come in for UDS 11/2014--she was mistaken--pt has 6 mth f/u appt for 01/22/2015

## 2015-01-18 NOTE — Telephone Encounter (Signed)
Pt called stating she received an email saying she needs an appointment  before she could get her med refilled. She stated she had labs done in dec and wanted to know if she still needed to come back for labs

## 2015-01-22 ENCOUNTER — Ambulatory Visit (INDEPENDENT_AMBULATORY_CARE_PROVIDER_SITE_OTHER): Payer: Medicare Other | Admitting: Internal Medicine

## 2015-01-22 ENCOUNTER — Encounter: Payer: Self-pay | Admitting: Internal Medicine

## 2015-01-22 VITALS — BP 148/84 | HR 68 | Temp 97.9°F | Wt 167.0 lb

## 2015-01-22 DIAGNOSIS — M25562 Pain in left knee: Secondary | ICD-10-CM | POA: Diagnosis not present

## 2015-01-22 DIAGNOSIS — F411 Generalized anxiety disorder: Secondary | ICD-10-CM

## 2015-01-22 DIAGNOSIS — M1712 Unilateral primary osteoarthritis, left knee: Secondary | ICD-10-CM

## 2015-01-22 DIAGNOSIS — M129 Arthropathy, unspecified: Secondary | ICD-10-CM

## 2015-01-22 DIAGNOSIS — E785 Hyperlipidemia, unspecified: Secondary | ICD-10-CM

## 2015-01-22 DIAGNOSIS — I10 Essential (primary) hypertension: Secondary | ICD-10-CM

## 2015-01-22 DIAGNOSIS — K219 Gastro-esophageal reflux disease without esophagitis: Secondary | ICD-10-CM

## 2015-01-22 DIAGNOSIS — E669 Obesity, unspecified: Secondary | ICD-10-CM

## 2015-01-22 LAB — COMPREHENSIVE METABOLIC PANEL
ALK PHOS: 62 U/L (ref 39–117)
ALT: 21 U/L (ref 0–35)
AST: 20 U/L (ref 0–37)
Albumin: 4.2 g/dL (ref 3.5–5.2)
BILIRUBIN TOTAL: 0.5 mg/dL (ref 0.2–1.2)
BUN: 15 mg/dL (ref 6–23)
CALCIUM: 9.5 mg/dL (ref 8.4–10.5)
CO2: 28 mEq/L (ref 19–32)
Chloride: 94 mEq/L — ABNORMAL LOW (ref 96–112)
Creatinine, Ser: 0.88 mg/dL (ref 0.40–1.20)
GFR: 67.71 mL/min (ref >60.00)
GLUCOSE: 89 mg/dL (ref 70–99)
POTASSIUM: 4.7 meq/L (ref 3.5–5.1)
Sodium: 128 mEq/L — ABNORMAL LOW (ref 135–145)
Total Protein: 7.4 g/dL (ref 6.0–8.3)

## 2015-01-22 LAB — CBC
HCT: 38 % (ref 36.0–46.0)
Hemoglobin: 13.1 g/dL (ref 12.0–15.0)
MCHC: 34.5 g/dL (ref 30.0–36.0)
MCV: 95.1 fl (ref 78.0–100.0)
PLATELETS: 196 10*3/uL (ref 150.0–400.0)
RBC: 3.99 Mil/uL (ref 3.87–5.11)
RDW: 12.5 % (ref 11.5–15.5)
WBC: 7.6 10*3/uL (ref 4.0–10.5)

## 2015-01-22 LAB — LIPID PANEL
Cholesterol: 190 mg/dL (ref 0–200)
HDL: 51.1 mg/dL (ref >39.00)
LDL CALC: 105 mg/dL — AB (ref 0–99)
NONHDL: 138.9
TRIGLYCERIDES: 170 mg/dL — AB (ref 0.0–149.0)
Total CHOL/HDL Ratio: 4
VLDL: 34 mg/dL (ref 0.0–40.0)

## 2015-01-22 NOTE — Assessment & Plan Note (Signed)
Slightly elevated today but she seems anxious Will continue Trimaterene-HCTZ and Metoprolol at this time Will check CBC and CMET today

## 2015-01-22 NOTE — Progress Notes (Signed)
Subjective:    Patient ID: Kristen Powell, female    DOB: 20-Feb-1946, 69 y.o.   MRN: [id number]  HPI  Pt presents to the clinic today for 6 month follow up of chronic medical conditions.  GAD: Takes a whole at night to help her sleep, but also takes 1/2 tab Ativan a couple of times per week when her nerves get really bad.  GERD: She switched from Nexium to Prilosec BID due to cost of the medication. Symptoms well controlled on Prilosec. Denies breakthrough symptoms.  HLD: LDL 119, Triglycerides 184. Denies myalgias on Zocor. Taking Fish Oil daily as well. Tries to consume a low fat diet.  HTN: BP well controlled on Triamterene-HCTZ and Metoprolol. BP today 148/84. She reports that she did take her blood pressure medication this morning. She reports that she gets very anxious when she comes to the doctor.  Obesity: Has lost 2 lbs in the last 6 months.  Left Knee pain: She reports that she is going to be having a left knee replacement. It has not been scheduled yet. She did have cortisone injection 11/2014 by Dr. Marry Guan.  Review of Systems      Past Medical History  Diagnosis Date  . Hypertension   . Hyperlipidemia   . Anxiety     Current Outpatient Prescriptions  Medication Sig Dispense Refill  . aspirin 81 MG tablet Take 81 mg by mouth daily.    Marland Kitchen atenolol (TENORMIN) 25 MG tablet TAKE 1 TABLET (25 MG TOTAL) BY MOUTH DAILY. 30 tablet 5  . calcium carbonate (OS-CAL) 600 MG TABS tablet Take 600 mg by mouth daily.    . Cholecalciferol (VITAMIN D) 2000 UNITS CAPS Take 1 capsule by mouth daily.    Marland Kitchen CRANBERRY PO Take 1 capsule by mouth daily. 4200mg     . LORazepam (ATIVAN) 0.5 MG tablet TAKE 1 TABLET BY MOUTH TWICE A DAY AS NEEDED 45 tablet 0  . naproxen sodium (ANAPROX) 220 MG tablet Take 220 mg by mouth daily.    . Omega-3 Fatty Acids (FISH OIL) 1000 MG CAPS Take 1 capsule by mouth 2 (two) times daily.    Marland Kitchen omeprazole (PRILOSEC) 20 MG capsule Take 1 capsule (20 mg total) by  mouth 2 (two) times daily before a meal. NEEDS APPOINTMENT FOR ANY FURTHER REFILLS 60 capsule 1  . simvastatin (ZOCOR) 20 MG tablet Take 1 tablet (20 mg total) by mouth daily. 30 tablet 3  . triamterene-hydrochlorothiazide (DYAZIDE) 37.5-25 MG per capsule Take 1 each (1 capsule total) by mouth daily. 90 capsule 1  . vitamin B-12 (CYANOCOBALAMIN) 1000 MCG tablet Take 1,000 mcg by mouth daily.    . vitamin E 400 UNIT capsule Take 400 Units by mouth daily.     No current facility-administered medications for this visit.    Not on File  Family History  Problem Relation Age of Onset  . Cancer Mother     lung  . Hypertension Sister     History   Social History  . Marital Status: Widowed    Spouse Name: N/A    Number of Children: N/A  . Years of Education: N/A   Occupational History  . Not on file.   Social History Main Topics  . Smoking status: Never Smoker   . Smokeless tobacco: Never Used  . Alcohol Use: No  . Drug Use: No  . Sexual Activity: Not on file   Other Topics Concern  . Not on file   Social History  Narrative     Constitutional: Denies fever, malaise, fatigue, headache or abrupt weight changes.  Respiratory: Denies difficulty breathing, shortness of breath, cough or sputum production.   Cardiovascular: Denies chest pain, chest tightness, palpitations or swelling in the hands or feet.  Gastrointestinal: Denies abdominal pain, bloating, constipation, diarrhea or blood in the stool.  Musculoskeletal: Pt reports left knee pain. Denies decrease in range of motion, difficulty with gait, muscle pain.  Skin: Denies redness, rashes, lesions or ulcercations.  Neurological: Denies dizziness, difficulty with memory, difficulty with speech or problems with balance and coordination.  Psych: Pt reports anxiety and stress. Denies depression, SI/HI.  No other specific complaints in a complete review of systems (except as listed in HPI above).  Objective:   Physical  Exam   BP 148/84 mmHg  Pulse 68  Temp(Src) 97.9 F (36.6 C) (Oral)  Wt 167 lb (75.751 kg)  SpO2 99% Wt Readings from Last 3 Encounters:  01/22/15 167 lb (75.751 kg)  08/13/14 169 lb (76.658 kg)  01/05/14 161 lb 8 oz (73.256 kg)    General: Appears her stated age, obese in NAD. Skin: Warm, dry and intact. No rashes, lesions or ulcerations noted. HEENT: Head: normal shape and size; Eyes: sclera white, no icterus, conjunctiva pink, PERRLA and EOMs intact;  Cardiovascular: Normal rate and rhythm. S1,S2 noted.  No murmur, rubs or gallops noted. No JVD or BLE edema. No carotid bruits noted. Pulmonary/Chest: Normal effort and positive vesicular breath sounds. No respiratory distress. No wheezes, rales or ronchi noted.  Abdomen: Soft and nontender. Normal bowel sounds, no bruits noted. No distention or masses noted. Liver, spleen and kidneys non palpable. Musculoskeletal: Decreased flexion of left knee. Normal extension. Crepitus noted with ROM of left knee. No signs of joint swelling. Strength 5/5 BLE. No difficulty with gait.  Neurological: Alert and oriented.  Coordination normal. . Psychiatric: Mood slightly anxious appearing but affect normal. Behavior is normal. Judgment and thought content normal.   EKG:  BMET    Component Value Date/Time   NA 135 08/13/2014 0915   K 4.4 08/13/2014 0915   CL 100 08/13/2014 0915   CO2 24 08/13/2014 0915   GLUCOSE 90 08/13/2014 0915   BUN 14 08/13/2014 0915   CREATININE 0.9 08/13/2014 0915   CALCIUM 9.1 08/13/2014 0915    Lipid Panel     Component Value Date/Time   CHOL 199 08/13/2014 0915   TRIG 184.0* 08/13/2014 0915   HDL 43.40 08/13/2014 0915   CHOLHDL 5 08/13/2014 0915   VLDL 36.8 08/13/2014 0915   LDLCALC 119* 08/13/2014 0915    CBC    Component Value Date/Time   WBC 6.7 08/13/2014 0915   RBC 3.67* 08/13/2014 0915   HGB 12.2 08/13/2014 0915   HCT 36.2 08/13/2014 0915   PLT 191.0 08/13/2014 0915   MCV 98.7 08/13/2014 0915    MCHC 33.7 08/13/2014 0915   RDW 12.6 08/13/2014 0915    Hgb A1C No results found for: HGBA1C      Assessment & Plan:   Left knee pain:  Due to arthritis Continue to follow with Dr. Marry Guan  RTC in 6 months for Medicare Wellness and 6 month follow up

## 2015-01-22 NOTE — Assessment & Plan Note (Signed)
Chronic but stable Advised her to see if there are nights that she could go without the Ativan She will try and update me in a few weeks She does not need a refill of the Ativan today

## 2015-01-22 NOTE — Progress Notes (Signed)
Pre visit review using our clinic review tool, if applicable. No additional management support is needed unless otherwise documented below in the visit note. 

## 2015-01-22 NOTE — Assessment & Plan Note (Signed)
Switched to Prilosec due to cost Continue Prilosec for now Will check CBC and CMET

## 2015-01-22 NOTE — Patient Instructions (Signed)
Fat and Cholesterol Control Diet Fat and cholesterol levels in your blood and organs are influenced by your diet. High levels of fat and cholesterol may lead to diseases of the heart, small and large blood vessels, gallbladder, liver, and pancreas. CONTROLLING FAT AND CHOLESTEROL WITH DIET Although exercise and lifestyle factors are important, your diet is key. That is because certain foods are known to raise cholesterol and others to lower it. The goal is to balance foods for their effect on cholesterol and more importantly, to replace saturated and trans fat with other types of fat, such as monounsaturated fat, polyunsaturated fat, and omega-3 fatty acids. On average, a person should consume no more than 15 to 17 g of saturated fat daily. Saturated and trans fats are considered "bad" fats, and they will raise LDL cholesterol. Saturated fats are primarily found in animal products such as meats, butter, and cream. However, that does not mean you need to give up all your favorite foods. Today, there are good tasting, low-fat, low-cholesterol substitutes for most of the things you like to eat. Choose low-fat or nonfat alternatives. Choose round or loin cuts of red meat. These types of cuts are lowest in fat and cholesterol. Chicken (without the skin), fish, veal, and ground turkey breast are great choices. Eliminate fatty meats, such as hot dogs and salami. Even shellfish have little or no saturated fat. Have a 3 oz (85 g) portion when you eat lean meat, poultry, or fish. Trans fats are also called "partially hydrogenated oils." They are oils that have been scientifically manipulated so that they are solid at room temperature resulting in a longer shelf life and improved taste and texture of foods in which they are added. Trans fats are found in stick margarine, some tub margarines, cookies, crackers, and baked goods.  When baking and cooking, oils are a great substitute for butter. The monounsaturated oils are  especially beneficial since it is believed they lower LDL and raise HDL. The oils you should avoid entirely are saturated tropical oils, such as coconut and palm.  Remember to eat a lot from food groups that are naturally free of saturated and trans fat, including fish, fruit, vegetables, beans, grains (barley, rice, couscous, bulgur wheat), and pasta (without cream sauces).  IDENTIFYING FOODS THAT LOWER FAT AND CHOLESTEROL  Soluble fiber may lower your cholesterol. This type of fiber is found in fruits such as apples, vegetables such as broccoli, potatoes, and carrots, legumes such as beans, peas, and lentils, and grains such as barley. Foods fortified with plant sterols (phytosterol) may also lower cholesterol. You should eat at least 2 g per day of these foods for a cholesterol lowering effect.  Read package labels to identify low-saturated fats, trans fat free, and low-fat foods at the supermarket. Select cheeses that have only 2 to 3 g saturated fat per ounce. Use a heart-healthy tub margarine that is free of trans fats or partially hydrogenated oil. When buying baked goods (cookies, crackers), avoid partially hydrogenated oils. Breads and muffins should be made from whole grains (whole-wheat or whole oat flour, instead of "flour" or "enriched flour"). Buy non-creamy canned soups with reduced salt and no added fats.  FOOD PREPARATION TECHNIQUES  Never deep-fry. If you must fry, either stir-fry, which uses very little fat, or use non-stick cooking sprays. When possible, broil, bake, or roast meats, and steam vegetables. Instead of putting butter or margarine on vegetables, use lemon and herbs, applesauce, and cinnamon (for squash and sweet potatoes). Use nonfat   yogurt, salsa, and low-fat dressings for salads.  LOW-SATURATED FAT / LOW-FAT FOOD SUBSTITUTES Meats / Saturated Fat (g)  Avoid: Steak, marbled (3 oz/85 g) / 11 g  Choose: Steak, lean (3 oz/85 g) / 4 g  Avoid: Hamburger (3 oz/85 g) / 7  g  Choose: Hamburger, lean (3 oz/85 g) / 5 g  Avoid: Ham (3 oz/85 g) / 6 g  Choose: Ham, lean cut (3 oz/85 g) / 2.4 g  Avoid: Chicken, with skin, dark meat (3 oz/85 g) / 4 g  Choose: Chicken, skin removed, dark meat (3 oz/85 g) / 2 g  Avoid: Chicken, with skin, light meat (3 oz/85 g) / 2.5 g  Choose: Chicken, skin removed, light meat (3 oz/85 g) / 1 g Dairy / Saturated Fat (g)  Avoid: Whole milk (1 cup) / 5 g  Choose: Low-fat milk, 2% (1 cup) / 3 g  Choose: Low-fat milk, 1% (1 cup) / 1.5 g  Choose: Skim milk (1 cup) / 0.3 g  Avoid: Hard cheese (1 oz/28 g) / 6 g  Choose: Skim milk cheese (1 oz/28 g) / 2 to 3 g  Avoid: Cottage cheese, 4% fat (1 cup) / 6.5 g  Choose: Low-fat cottage cheese, 1% fat (1 cup) / 1.5 g  Avoid: Ice cream (1 cup) / 9 g  Choose: Sherbet (1 cup) / 2.5 g  Choose: Nonfat frozen yogurt (1 cup) / 0.3 g  Choose: Frozen fruit bar / trace  Avoid: Whipped cream (1 tbs) / 3.5 g  Choose: Nondairy whipped topping (1 tbs) / 1 g Condiments / Saturated Fat (g)  Avoid: Mayonnaise (1 tbs) / 2 g  Choose: Low-fat mayonnaise (1 tbs) / 1 g  Avoid: Butter (1 tbs) / 7 g  Choose: Extra light margarine (1 tbs) / 1 g  Avoid: Coconut oil (1 tbs) / 11.8 g  Choose: Olive oil (1 tbs) / 1.8 g  Choose: Corn oil (1 tbs) / 1.7 g  Choose: Safflower oil (1 tbs) / 1.2 g  Choose: Sunflower oil (1 tbs) / 1.4 g  Choose: Soybean oil (1 tbs) / 2.4 g  Choose: Canola oil (1 tbs) / 1 g Document Released: 11/30/2005 Document Revised: 03/27/2013 Document Reviewed: 02/28/2014 Exit[employer] Patient Information 2015 [employer]. This information is not intended to replace advice given to you by your health care provider. Make sure you discuss any questions you have with your health care provider.  

## 2015-01-22 NOTE — Assessment & Plan Note (Signed)
Congratulated her on a 2 lb weight loss Encouraged her to continue to work on diet and exercise

## 2015-01-22 NOTE — Assessment & Plan Note (Signed)
LDL at goal on Zocor Will check CMET and Lipid profile today Will adjust Zocor if needed and refill per request Handout given on low fat diet

## 2015-02-02 ENCOUNTER — Other Ambulatory Visit: Payer: Self-pay | Admitting: Internal Medicine

## 2015-02-06 ENCOUNTER — Other Ambulatory Visit: Payer: Self-pay | Admitting: Internal Medicine

## 2015-02-06 NOTE — Telephone Encounter (Signed)
Rx called in to pharmacy. 

## 2015-02-06 NOTE — Telephone Encounter (Signed)
Last filled 01/03/2015--please advise

## 2015-02-06 NOTE — Telephone Encounter (Signed)
Ok to phone in ativan 

## 2015-02-07 DIAGNOSIS — M1712 Unilateral primary osteoarthritis, left knee: Secondary | ICD-10-CM | POA: Diagnosis not present

## 2015-02-15 DIAGNOSIS — M25552 Pain in left hip: Secondary | ICD-10-CM | POA: Diagnosis not present

## 2015-02-15 DIAGNOSIS — M1712 Unilateral primary osteoarthritis, left knee: Secondary | ICD-10-CM | POA: Diagnosis not present

## 2015-02-20 ENCOUNTER — Ambulatory Visit: Payer: Self-pay | Admitting: Orthopedic Surgery

## 2015-02-20 DIAGNOSIS — Z01818 Encounter for other preprocedural examination: Secondary | ICD-10-CM | POA: Diagnosis not present

## 2015-02-20 DIAGNOSIS — M1712 Unilateral primary osteoarthritis, left knee: Secondary | ICD-10-CM | POA: Diagnosis not present

## 2015-02-27 ENCOUNTER — Other Ambulatory Visit: Payer: Self-pay

## 2015-02-27 MED ORDER — TRIAMTERENE-HCTZ 37.5-25 MG PO CAPS: 1.0000 | ORAL_CAPSULE | Freq: Every day | ORAL | Status: DC

## 2015-03-11 ENCOUNTER — Other Ambulatory Visit: Payer: Self-pay | Admitting: Internal Medicine

## 2015-03-19 ENCOUNTER — Other Ambulatory Visit: Payer: Self-pay

## 2015-03-19 MED ORDER — ATENOLOL 25 MG PO TABS: 25.0000 mg | ORAL_TABLET | Freq: Every day | ORAL | Status: DC

## 2015-03-20 ENCOUNTER — Other Ambulatory Visit: Payer: Self-pay | Admitting: Internal Medicine

## 2015-03-20 NOTE — Telephone Encounter (Signed)
Ok to phone in ativan 

## 2015-03-20 NOTE — Telephone Encounter (Signed)
Last filled 02/06/15--please advise

## 2015-03-21 NOTE — Telephone Encounter (Signed)
Rx called in to pharmacy. 

## 2015-03-26 ENCOUNTER — Telehealth: Payer: Self-pay

## 2015-03-26 NOTE — Telephone Encounter (Signed)
Pt left v/m; pt scheduled 04/11/15 for Total Knee replacement with [facility]; pt has appt on 03/27/15 and wants 01/22/15 labs. Pt notified lab results at front desk for pick up.

## 2015-03-27 ENCOUNTER — Ambulatory Visit
Admit: 2015-03-27 | Disposition: A | Discharge status: Home / Self Care | Payer: Self-pay | Attending: Orthopedic Surgery | Admitting: Orthopedic Surgery

## 2015-03-27 DIAGNOSIS — I10 Essential (primary) hypertension: Secondary | ICD-10-CM | POA: Diagnosis not present

## 2015-03-27 DIAGNOSIS — M79662 Pain in left lower leg: Secondary | ICD-10-CM | POA: Diagnosis not present

## 2015-03-27 DIAGNOSIS — Z01812 Encounter for preprocedural laboratory examination: Secondary | ICD-10-CM | POA: Diagnosis not present

## 2015-03-27 DIAGNOSIS — Z79899 Other long term (current) drug therapy: Secondary | ICD-10-CM | POA: Diagnosis not present

## 2015-03-27 DIAGNOSIS — Z01810 Encounter for preprocedural cardiovascular examination: Secondary | ICD-10-CM | POA: Diagnosis not present

## 2015-03-27 DIAGNOSIS — M1712 Unilateral primary osteoarthritis, left knee: Secondary | ICD-10-CM | POA: Diagnosis not present

## 2015-03-27 LAB — PROTIME-INR
INR: 1
Prothrombin Time: 13 secs

## 2015-03-27 LAB — URINALYSIS, COMPLETE
BILIRUBIN, UR: NEGATIVE
GLUCOSE, UR: NEGATIVE mg/dL (ref 0–75)
Ketone: NEGATIVE
Nitrite: POSITIVE
Ph: 5 (ref 4.5–8.0)
Protein: NEGATIVE
SQUAMOUS EPITHELIAL: NONE SEEN
Specific Gravity: 1.008 (ref 1.003–1.030)

## 2015-03-27 LAB — CBC
HCT: 37.5 % (ref 35.0–47.0)
HGB: 12.7 g/dL (ref 12.0–16.0)
MCH: 32.8 pg (ref 26.0–34.0)
MCHC: 33.8 g/dL (ref 32.0–36.0)
MCV: 97 fL (ref 80–100)
Platelet: 133 10*3/uL — ABNORMAL LOW (ref 150–440)
RBC: 3.86 10*6/uL (ref 3.80–5.20)
RDW: 12.7 % (ref 11.5–14.5)
WBC: 7.3 10*3/uL (ref 3.6–11.0)

## 2015-03-27 LAB — BASIC METABOLIC PANEL
Anion Gap: 8 (ref 7–16)
BUN: 16 mg/dL
CHLORIDE: 97 mmol/L — AB
CREATININE: 0.82 mg/dL
Calcium, Total: 9.1 mg/dL
Co2: 26 mmol/L
Glucose: 115 mg/dL — ABNORMAL HIGH
Potassium: 3.7 mmol/L
Sodium: 131 mmol/L — ABNORMAL LOW

## 2015-03-27 LAB — MRSA PCR SCREENING

## 2015-03-27 LAB — APTT: ACTIVATED PTT: 32.5 s (ref 23.6–35.9)

## 2015-03-27 LAB — SEDIMENTATION RATE: Erythrocyte Sed Rate: 25 mm/hr (ref 0–30)

## 2015-04-11 ENCOUNTER — Inpatient Hospital Stay
Admit: 2015-04-11 | Discharge: 2015-04-15 | DRG: 470 | Disposition: A | Discharge status: Home / Self Care | Payer: Medicare Other | Source: Ambulatory Visit | Attending: Orthopedic Surgery | Admitting: Orthopedic Surgery

## 2015-04-11 ENCOUNTER — Other Ambulatory Visit: Payer: Medicare Other

## 2015-04-11 DIAGNOSIS — Z9842 Cataract extraction status, left eye: Secondary | ICD-10-CM

## 2015-04-11 DIAGNOSIS — D62 Acute posthemorrhagic anemia: Secondary | ICD-10-CM | POA: Diagnosis not present

## 2015-04-11 DIAGNOSIS — Z90710 Acquired absence of both cervix and uterus: Secondary | ICD-10-CM | POA: Diagnosis not present

## 2015-04-11 DIAGNOSIS — Z961 Presence of intraocular lens: Secondary | ICD-10-CM | POA: Diagnosis present

## 2015-04-11 DIAGNOSIS — Z471 Aftercare following joint replacement surgery: Secondary | ICD-10-CM | POA: Diagnosis not present

## 2015-04-11 DIAGNOSIS — Z8249 Family history of ischemic heart disease and other diseases of the circulatory system: Secondary | ICD-10-CM | POA: Diagnosis not present

## 2015-04-11 DIAGNOSIS — Z7982 Long term (current) use of aspirin: Secondary | ICD-10-CM

## 2015-04-11 DIAGNOSIS — M1712 Unilateral primary osteoarthritis, left knee: Secondary | ICD-10-CM | POA: Diagnosis not present

## 2015-04-11 DIAGNOSIS — Z79899 Other long term (current) drug therapy: Secondary | ICD-10-CM

## 2015-04-11 DIAGNOSIS — I10 Essential (primary) hypertension: Secondary | ICD-10-CM | POA: Diagnosis present

## 2015-04-11 DIAGNOSIS — Z96659 Presence of unspecified artificial knee joint: Secondary | ICD-10-CM

## 2015-04-11 DIAGNOSIS — Z9841 Cataract extraction status, right eye: Secondary | ICD-10-CM

## 2015-04-11 DIAGNOSIS — Z8261 Family history of arthritis: Secondary | ICD-10-CM | POA: Diagnosis not present

## 2015-04-11 DIAGNOSIS — K219 Gastro-esophageal reflux disease without esophagitis: Secondary | ICD-10-CM | POA: Diagnosis present

## 2015-04-11 DIAGNOSIS — F419 Anxiety disorder, unspecified: Secondary | ICD-10-CM | POA: Diagnosis present

## 2015-04-11 DIAGNOSIS — Z96652 Presence of left artificial knee joint: Secondary | ICD-10-CM | POA: Diagnosis not present

## 2015-04-11 DIAGNOSIS — M179 Osteoarthritis of knee, unspecified: Secondary | ICD-10-CM | POA: Diagnosis not present

## 2015-04-11 DIAGNOSIS — Z801 Family history of malignant neoplasm of trachea, bronchus and lung: Secondary | ICD-10-CM | POA: Diagnosis not present

## 2015-04-11 DIAGNOSIS — Z791 Long term (current) use of non-steroidal anti-inflammatories (NSAID): Secondary | ICD-10-CM | POA: Diagnosis not present

## 2015-04-11 DIAGNOSIS — M549 Dorsalgia, unspecified: Secondary | ICD-10-CM | POA: Diagnosis present

## 2015-04-11 DIAGNOSIS — Z85828 Personal history of other malignant neoplasm of skin: Secondary | ICD-10-CM

## 2015-04-11 DIAGNOSIS — Z8619 Personal history of other infectious and parasitic diseases: Secondary | ICD-10-CM

## 2015-04-11 DIAGNOSIS — E785 Hyperlipidemia, unspecified: Secondary | ICD-10-CM | POA: Diagnosis present

## 2015-04-11 HISTORY — PX: REPLACEMENT TOTAL KNEE: SUR1224

## 2015-04-12 LAB — BASIC METABOLIC PANEL
Anion Gap: 7 (ref 7–16)
BUN: 14 mg/dL
CO2: 25 mmol/L
CREATININE: 0.79 mg/dL
Calcium, Total: 8 mg/dL — ABNORMAL LOW
Chloride: 98 mmol/L — ABNORMAL LOW
Glucose: 102 mg/dL — ABNORMAL HIGH
Potassium: 4.3 mmol/L
Sodium: 130 mmol/L — ABNORMAL LOW

## 2015-04-12 LAB — PLATELET COUNT: PLATELETS: 178 10*3/uL (ref 150–440)

## 2015-04-12 LAB — HEMOGLOBIN: HGB: 10.2 g/dL — ABNORMAL LOW (ref 12.0–16.0)

## 2015-04-13 DIAGNOSIS — Z96659 Presence of unspecified artificial knee joint: Secondary | ICD-10-CM

## 2015-04-13 LAB — HEMOGLOBIN: HGB: 9.8 g/dL — AB (ref 12.0–16.0)

## 2015-04-13 MED ORDER — ONDANSETRON 4 MG PO TBDP: 4.0000 mg | ORAL_TABLET | ORAL | Status: DC | PRN

## 2015-04-13 MED ORDER — ALUM & MAG HYDROXIDE-SIMETH 200-200-20 MG/5ML PO SUSP: 30.0000 mL | Freq: Four times a day (QID) | ORAL | Status: DC | PRN

## 2015-04-13 MED ORDER — MAGNESIUM HYDROXIDE 400 MG/5ML PO SUSP: 30.0000 mL | Freq: Two times a day (BID) | ORAL | Status: DC | PRN

## 2015-04-13 MED ORDER — VITAMIN E 180 MG (400 UNIT) PO CAPS
400.0000 [IU] | ORAL_CAPSULE | Freq: Every day | ORAL | Status: DC
  Administered 2015-04-14 – 2015-04-15 (×2): 400 [IU] via ORAL
  Filled 2015-04-13 (×2): qty 1

## 2015-04-13 MED ORDER — ATENOLOL 25 MG PO TABS
25.0000 mg | ORAL_TABLET | Freq: Every day | ORAL | Status: DC
  Filled 2015-04-13: qty 1

## 2015-04-13 MED ORDER — SENNOSIDES-DOCUSATE SODIUM 8.6-50 MG PO TABS
1.0000 | ORAL_TABLET | Freq: Two times a day (BID) | ORAL | Status: DC
  Administered 2015-04-14 – 2015-04-15 (×3): 1 via ORAL
  Filled 2015-04-13 (×3): qty 1

## 2015-04-13 MED ORDER — SIMVASTATIN 20 MG PO TABS
20.0000 mg | ORAL_TABLET | Freq: Every day | ORAL | Status: DC
  Administered 2015-04-14: 20 mg via ORAL
  Filled 2015-04-13: qty 1

## 2015-04-13 MED ORDER — TRIAMTERENE-HCTZ 37.5-25 MG PO TABS
1.0000 | ORAL_TABLET | Freq: Every day | ORAL | Status: DC
  Administered 2015-04-14 – 2015-04-15 (×2): 1 via ORAL
  Filled 2015-04-13 (×2): qty 1

## 2015-04-13 MED ORDER — MORPHINE SULFATE 2 MG/ML IJ SOLN: 2.0000 mg | INTRAMUSCULAR | Status: DC | PRN

## 2015-04-13 MED ORDER — ASPIRIN EC 81 MG PO TBEC
81.0000 mg | DELAYED_RELEASE_TABLET | Freq: Every day | ORAL | Status: DC
  Administered 2015-04-14 – 2015-04-15 (×2): 81 mg via ORAL
  Filled 2015-04-13 (×2): qty 1

## 2015-04-13 MED ORDER — PANTOPRAZOLE SODIUM 40 MG PO TBEC
40.0000 mg | DELAYED_RELEASE_TABLET | Freq: Two times a day (BID) | ORAL | Status: DC
  Administered 2015-04-14 – 2015-04-15 (×3): 40 mg via ORAL
  Filled 2015-04-13 (×3): qty 1

## 2015-04-13 MED ORDER — VITAMIN B-12 1000 MCG PO TABS
1000.0000 ug | ORAL_TABLET | Freq: Every day | ORAL | Status: DC
  Administered 2015-04-14 – 2015-04-15 (×2): 1000 ug via ORAL
  Filled 2015-04-13 (×2): qty 1

## 2015-04-13 MED ORDER — SODIUM CHLORIDE 0.9 % IJ SOLN: 3.0000 mL | INTRAMUSCULAR | Status: DC | PRN

## 2015-04-13 MED ORDER — BISACODYL 10 MG RE SUPP: 10.0000 mg | Freq: Every day | RECTAL | Status: DC | PRN

## 2015-04-13 MED ORDER — ACETAMINOPHEN 500 MG PO TABS: 500.0000 mg | ORAL_TABLET | ORAL | Status: DC | PRN

## 2015-04-13 MED ORDER — ONDANSETRON HCL 4 MG/2ML IJ SOLN: 4.0000 mg | INTRAMUSCULAR | Status: DC | PRN

## 2015-04-13 MED ORDER — OXYCODONE HCL 5 MG PO TABS
5.0000 mg | ORAL_TABLET | ORAL | Status: DC | PRN
  Administered 2015-04-14: 5 mg via ORAL
  Administered 2015-04-14 – 2015-04-15 (×2): 10 mg via ORAL
  Filled 2015-04-13: qty 1
  Filled 2015-04-13 (×2): qty 2

## 2015-04-13 MED ORDER — ENOXAPARIN SODIUM 30 MG/0.3ML ~~LOC~~ SOLN
30.0000 mg | Freq: Two times a day (BID) | SUBCUTANEOUS | Status: DC
  Administered 2015-04-14 – 2015-04-15 (×3): 30 mg via SUBCUTANEOUS
  Filled 2015-04-13 (×3): qty 0.3

## 2015-04-13 MED ORDER — SODIUM CHLORIDE 0.9 % IJ SOLN: 3.0000 mL | Freq: Four times a day (QID) | INTRAMUSCULAR | Status: DC

## 2015-04-13 MED ORDER — VITAMIN D3 25 MCG (1000 UNIT) PO TABS
2000.0000 [IU] | ORAL_TABLET | Freq: Every day | ORAL | Status: DC
  Administered 2015-04-14 – 2015-04-15 (×2): 2000 [IU] via ORAL
  Filled 2015-04-13 (×4): qty 2

## 2015-04-13 MED ORDER — ZOLPIDEM TARTRATE 5 MG PO TABS: 5.0000 mg | ORAL_TABLET | Freq: Every evening | ORAL | Status: DC | PRN

## 2015-04-13 MED ORDER — TRAMADOL HCL 50 MG PO TABS
50.0000 mg | ORAL_TABLET | Freq: Four times a day (QID) | ORAL | Status: DC | PRN
  Administered 2015-04-14: 50 mg via ORAL
  Filled 2015-04-13: qty 1

## 2015-04-13 MED ORDER — LORAZEPAM 0.5 MG PO TABS: 0.5000 mg | ORAL_TABLET | Freq: Two times a day (BID) | ORAL | Status: DC | PRN

## 2015-04-14 NOTE — Progress Notes (Signed)
Pt alert and oriented. Pain relieved with medication administered. No acute distress noted

## 2015-04-14 NOTE — Progress Notes (Addendum)
   Subjective:     Patient reports pain as 6 on 0-10 scale.   Patient is resting well. We will continue with therapy.  Plan is to go Skilled nursing facility after hospital stay. no nausea and no vomiting no cp or sob Objective: Vital signs in last 24 hours: Temp:  [98 F (36.7 C)-98.3 F (36.8 C)] 98 F (36.7 C) (05/01 0725) Pulse Rate:  [59-66] 59 (05/01 0725) Resp:  [18-20] 18 (05/01 0725) BP: (124-150)/(42-70) 134/52 mmHg (05/01 0725) SpO2:  [95 %] 95 % (05/01 0725) well approximated incision; minimal drainage  Intake/Output from previous day:   Intake/Output this shift:     Recent Labs  04/12/15 0501 04/13/15 0444  HGB 10.2* 9.8*    Recent Labs  04/12/15 0501  PLT 178    Recent Labs  04/12/15 0501  CO2 25  BUN 14  CREATININE 0.79  CALCIUM 8.0*   No results for input(s): LABPT, INR in the last 72 hours.  EXAM General - Patient is Alert and Appropriate Extremity - Neurovascular intact Intact pulses distally Dorsiflexion/Plantar flexion intact Incision: scant drainage Dressing - will change prior to d/c Motor Function - intact, moving foot and toes well on exam.    No past medical history on file.  Assessment/Plan:     Active Problems:   S/P total knee replacement  Estimated body mass index is 34.93 kg/(m^2) as calculated from the following:   Height as of this encounter: 5\' 1"  (1.549 m).   Weight as of this encounter: 83.8 kg (184 lb 11.9 oz). Up with therapy but very slow Needs bowel movement Continue current treatment  DVT Prophylaxis - Lovenox, Foot Pumps and TED hose Weight-Bearing as tolerated to right leg D/C O2 and Pulse OX and try on Room [employer] R. [place] 04/14/2015, 7:49 AM

## 2015-04-14 NOTE — Progress Notes (Signed)
Patient states she is  resting better this shift . Pain med  given  by  alternating po narcotics with good results. Sister at bedside  and aids patient  with care. Patient plans for discharge to sister's house when released .

## 2015-04-14 NOTE — Op Note (Signed)
PATIENT NAME:  Kristen Powell, Kristen Powell MR#:  [id number] DATE OF BIRTH:  [birth year]  DATE OF PROCEDURE:  04/11/2015  PREOPERATIVE DIAGNOSIS: Left knee osteoarthritis.   POSTOPERATIVE DIAGNOSIS: Left knee osteoarthritis.   PROCEDURE: Left total knee replacement.   ANESTHESIA: Spinal.   SURGEON: Hessie Knows, MD   ASSISTANT: Rachelle Hora, PA-C   DESCRIPTION OF PROCEDURE: The patient was brought to the Operating Room and after adequate anesthesia was obtained, the left leg was prepped and draped in the usual sterile fashion with a tourniquet applied to the upper thigh. The patient identification and timeout procedures were completed, the tourniquet was raised to 300 mmHg. A midline skin incision was made, followed by a medial parapatellar arthrotomy. Inspection of the joint revealed degenerative changes throughout the knee, worse at the patellofemoral joint, where there is exposed bone, as well as within the weight-bearing surfaces on the femur and tibia. The anterior cruciate ligament and posterior cruciate ligament were excised, along with the fat pad and anterior horns of the menisci. The anterior medial aspect of the tibia was exposed for application of the [place] cutting guide that was applied and proximal tibia cut carried out with resection of the tibia. The distal femur was approached in a similar fashion with distal femoral cut made, followed by the 4-in-1 cutting guide with a 3+ based on preop templating. Anterior, posterior and chamfer cuts were made with no notching. Residual posterior horns of the menisci were excised at this time. A size 3 tibia trial was placed, along with a 3+ femur, 10 mm insert gave full range of motion and good stability. The tibia proximal drill hole was carried out, along with preparation for a short stem and the keel punch. The distal femoral drill holes were made and a notch made for the trochlear groove. These trials were removed. The patella was cut using the patellar  cutting guide after measuring the thickness, resecting 10 mm a size 2 gave good coverage after 3 drill holes had been made. At this point, the tourniquet was let down and the knee was infiltrated with a dilute mixture of Exparel and Sensorcaine with morphine. The tourniquet was then raised after checking that there was no significant bleeding. The bone was thoroughly irrigated and dried. The with components were then cemented into place with the knee held in extension. After the cement had set, the knee was thoroughly irrigated and excess cement removed. The patella tracked well with no touch technique. The arthrotomy was repaired using a heavy quill suture, 2 - 0 Quill subcutaneously and skin staples. Xeroform, 4 x 4's, ABD, web roll and Ace wrap applied.   The patient was sent to the recovery room in stable condition.   ESTIMATED BLOOD LOSS: 25 mL   TOURNIQUET TIME: 75 minutes.   COMPLICATIONS: There were no complications.   SPECIMENS: Cut ends of bone.   IMPLANTS: Medacta GMK sphere size 3+ left, a  size 3 left fixed tibial tray with a 11 x 30 mm stem, a 10 mm flex insert and a size 2 patella. All components cemented.    ____________________________ Laurene Footman, MD mjm:AT D: 04/11/2015 20:42:16 ET T: 04/12/2015 09:21:35 ET JOB#: [id number]  cc: Laurene Footman, MD, <Dictator> Laurene Footman MD ELECTRONICALLY SIGNED 04/12/2015 17:31

## 2015-04-14 NOTE — Progress Notes (Signed)
Physical Therapy Treatment Patient Details Name: Kristen Powell MRN: [id number] DOB: 1946-02-22 Today's Date: 04/14/2015    History of Present Illness 69 yo female with new L TKA and PMHx:  OA,     PT Comments    Pt was instructed in bed mob to increase her independence and has demonstrated control on L knee with slight support on leg.  Has demonstrated ability but continues to have trouble with getting light headed and nauseous but not related to vital signs.  Follow Up Recommendations  Supervision/Assistance - 24 hour;Home health PT     Equipment Recommendations  Rolling walker with 5" wheels    Recommendations for Other Services       Precautions / Restrictions Precautions Precautions: Fall;Knee Precaution Booklet Issued: Yes (comment) Restrictions Weight Bearing Restrictions: Yes LLE Weight Bearing: Weight bearing as tolerated    Mobility  Bed Mobility Overal bed mobility: Needs Assistance Bed Mobility: Rolling;Supine to Sit;Sit to Supine Rolling: Supervision;Min guard   Supine to sit: Min guard;Min assist Sit to supine: Supervision;Min guard   General bed mobility comments: reminders for hand placement and to avoid bedrail use as she is going home tomorrow  Transfers Overall transfer level: Needs assistance Equipment used: Rolling walker (2 wheeled) Transfers: Sit to/from [facility] Sit to Stand: Min guard Stand pivot transfers: Min guard       General transfer comment: hand placement reminders but follows through well, mostly an issue to power up  Ambulation/Gait Ambulation/Gait assistance: Supervision;Min guard Ambulation Distance (Feet): 100 Feet Assistive device: Rolling walker (2 wheeled);1 person hand held assist Gait Pattern/deviations: Step-to pattern;Decreased dorsiflexion - right;Decreased stance time - left;Decreased dorsiflexion - left;Wide base of support;Trunk flexed Gait velocity: reduced Gait velocity interpretation:  Below normal speed for age/gender General Gait Details: Slow pace with slightly improved tendency to step to gait and having some light headedness    Stairs            Wheelchair Mobility    Modified Rankin (Stroke Patients Only)       Balance Overall balance assessment: Needs assistance Sitting-balance support: Feet supported;Bilateral upper extremity supported Sitting balance-Leahy Scale: Fair   Postural control: Posterior lean Standing balance support: Bilateral upper extremity supported Standing balance-Leahy Scale: Poor Standing balance comment: reminders for controlling walker with wbing on RLE                    Cognition Arousal/Alertness: Awake/alert Behavior During Therapy: WFL for tasks assessed/performed Overall Cognitive Status: Within Functional Limits for tasks assessed                      Exercises Total Joint Exercises Ankle Circles/Pumps: AROM;Both;10 reps Quad Sets: AROM;Both;15 reps Gluteal Sets: AROM;Both;15 reps Heel Slides: AROM;AAROM;Both;10 reps Hip ABduction/ADduction: AROM;Both;10 reps Straight Leg Raises: AROM;AAROM;Both;10 reps Goniometric ROM: -10, 85    General Comments General comments (skin integrity, edema, etc.): Pt is demonstrating chronic light headedness and attached to nausea today, spoke with nursing about it to ck her      Pertinent Vitals/Pain Pain Assessment: 0-10 Pain Score: 5  Pain Location: R knee Pain Intervention(s): Limited activity within patient's tolerance;Monitored during session;Premedicated before session;Repositioned;Ice applied    Home Living                      Prior Function            PT Goals (current goals can now be found in the care plan  section) Acute Rehab PT Goals Patient Stated Goal: TO continue to work on going home Progress towards PT goals: Progressing toward goals    Frequency  7X/week    PT Plan Current plan remains appropriate    Co-evaluation              End of Session Equipment Utilized During Treatment: Gait belt Activity Tolerance: Patient tolerated treatment well;Other (comment) (nausea with minor pain complaints) Patient left: in bed;with call bell/phone within reach;with bed alarm set     Time: 1020-1055 PT Time Calculation (min) (ACUTE ONLY): 35 min  Charges:  $Gait Training: 8-22 mins $Therapeutic Exercise: 8-22 mins                    G Codes:      Kristen Powell May 03, 2015, 12:56 PM  Mee Hives, PT MS Acute Rehab Dept. Number: [phone]

## 2015-04-14 NOTE — Progress Notes (Signed)
Physical Therapy Treatment Patient Details Name: Kristen Powell MRN: [id number] DOB: 1946-10-29 Today's Date: 04/14/2015    History of Present Illness 69 yo female with new L TKA and PMHx:  OA,     PT Comments    Pt was seen for PM visit with pt walking more than AM, very motivated and having less pain.  No notable pain complaints with gait or end of range stretching on L knee.  Pt still planning to go home with sister.  Has demonstrated independent bed mobility.    Follow Up Recommendations  Supervision/Assistance - 24 hour;Home health PT     Equipment Recommendations  Rolling walker with 5" wheels    Recommendations for Other Services       Precautions / Restrictions Precautions Precautions: Fall;Knee Precaution Booklet Issued: Yes (comment) Restrictions Weight Bearing Restrictions: Yes LLE Weight Bearing: Weight bearing as tolerated    Mobility  Bed Mobility Overal bed mobility: Needs Assistance Bed Mobility: Rolling;Supine to Sit;Sit to Supine Rolling: Supervision   Supine to sit: Supervision Sit to supine: Supervision   General bed mobility comments: reminders for hand placement and to avoid bedrail use as she is going home tomorrow  Transfers Overall transfer level: Needs assistance Equipment used: Rolling walker (2 wheeled)   Sit to Stand: Min guard Stand pivot transfers: Min guard       General transfer comment: hand placement reminders but follows through well, mostly an issue to power up  Ambulation/Gait Ambulation/Gait assistance: Supervision Ambulation Distance (Feet): 120 Feet Assistive device: Rolling walker (2 wheeled);1 person hand held assist Gait Pattern/deviations: Step-through pattern;Decreased step length - left;Decreased step length - right;Decreased stride length;Wide base of support;Trunk flexed Gait velocity: reduced Gait velocity interpretation: Below normal speed for age/gender General Gait Details: Slower pace but better speed  from AM and not having any feelings of light headedness at all   Stairs            Wheelchair Mobility    Modified Rankin (Stroke Patients Only)       Balance     Sitting balance-Leahy Scale: Fair       Standing balance-Leahy Scale: Fair                      Cognition Arousal/Alertness: Awake/alert Behavior During Therapy: WFL for tasks assessed/performed Overall Cognitive Status: Within Functional Limits for tasks assessed                      Exercises Total Joint Exercises Ankle Circles/Pumps: AROM;Both;10 reps Quad Sets: AROM;Both;15 reps Gluteal Sets: AROM;Both;15 reps Heel Slides: AROM;AAROM;Both;10 reps    General Comments        Pertinent Vitals/Pain      Home Living                      Prior Function            PT Goals (current goals can now be found in the care plan section) Acute Rehab PT Goals Patient Stated Goal: To continue to work on going home Additional Goals Additional Goal #1: Pt will be able to safely perform 10-15 reps of ther ex with CGA in order to improve strength while demonstrating safe technique  Progress towards PT goals: Progressing toward goals    Frequency  7X/week    PT Plan Current plan remains appropriate    Co-evaluation  End of Session Equipment Utilized During Treatment: Gait belt Activity Tolerance: Patient tolerated treatment well;Other (comment) (nausea with minor pain complaints) Patient left: in bed;with call bell/phone within reach;with bed alarm set     Time: 1540-1610 PT Time Calculation (min) (ACUTE ONLY): 30 min  Charges:  $Gait Training: 8-22 mins $Therapeutic Exercise: 8-22 mins                    G Codes:      Ramond Dial Apr 16, 2015, 4:42 PM   Mee Hives, PT MS Acute Rehab Dept. Number: [phone]

## 2015-04-15 MED ORDER — TRAMADOL HCL 50 MG PO TABS: 50.0000 mg | ORAL_TABLET | Freq: Four times a day (QID) | ORAL | Status: DC | PRN

## 2015-04-15 MED ORDER — HYDROCODONE-ACETAMINOPHEN 5-325 MG PO TABS: 1.0000 | ORAL_TABLET | ORAL | Status: DC | PRN

## 2015-04-15 MED ORDER — ENOXAPARIN SODIUM 30 MG/0.3ML ~~LOC~~ SOLN: 40.0000 mg | SUBCUTANEOUS | Status: DC

## 2015-04-15 MED ORDER — ACETAMINOPHEN 500 MG PO TABS: 500.0000 mg | ORAL_TABLET | ORAL | Status: AC | PRN

## 2015-04-15 NOTE — Progress Notes (Signed)
   Subjective:     Patient reports pain as 1 on 0-10 scale.   Patient is well, and has had no acute complaints or problems We will start therapy today.  Plan is to go Home after hospital stay. no nausea and no vomiting no cp or sob Objective: Vital signs in last 24 hours: Temp:  [98.5 F (36.9 C)-99.5 F (37.5 C)] 98.6 F (37 C) (05/02 0749) Pulse Rate:  [62-67] 62 (05/02 0749) Resp:  [18] 18 (05/02 0749) BP: (136-158)/(44-55) 145/48 mmHg (05/02 0749) SpO2:  [93 %-98 %] 93 % (05/02 0749) well approximated incision  Intake/Output from previous day: 05/01 0701 - 05/02 0700 In: 240 [P.O.:240] Out: 200 [Urine:200] Intake/Output this shift: Total I/O In: 120 [P.O.:120] Out: 0    Recent Labs  04/13/15 0444  HGB 9.8*   No results for input(s): WBC, RBC, HCT, PLT in the last 72 hours. No results for input(s): NA, K, CL, CO2, BUN, CREATININE, GLUCOSE, CALCIUM in the last 72 hours. No results for input(s): LABPT, INR in the last 72 hours.  EXAM General - Patient is Alert, Appropriate and Oriented Extremity - Neurologically intact Neurovascular intact Sensation intact distally Intact pulses distally Dorsiflexion/Plantar flexion intact Dressing - dressing C/D/I Motor Function - intact, moving foot and toes well on exam. Good strength with moving of LE  No past medical history on file.  Assessment/Plan:     Active Problems:   S/P total knee replacement  Estimated body mass index is 34.93 kg/(m^2) as calculated from the following:   Height as of this encounter: 5\' 1"  (1.549 m).   Weight as of this encounter: 83.8 kg (184 lb 11.9 oz). Advance diet  DVT Prophylaxis - Lovenox, Foot Pumps and TED hose Weight-Bearing as tolerated to left leg D/C O2 and Pulse OX and try on Room Air  Delshon Blanchfield R. [place] 04/15/2015, 10:57 AM

## 2015-04-15 NOTE — Discharge Instructions (Signed)

## 2015-04-15 NOTE — Discharge Summary (Signed)
Physician Discharge Summary  Patient ID: Kristen Powell MRN: [id number] DOB/AGE: 69-Jul-1947 69 y.o.  Admit date: 04/11/2015 Discharge date: 04/15/2015  Admission Diagnoses:  <principal problem not specified>  Discharge Diagnoses: Patient Active Problem List   Diagnosis Date Noted  . 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   Transfusion: none   Consultants (if any):  none  Discharged Condition: Improved  Hospital Course: Kristen Powell is an 69 y.o. female who was admitted 04/11/2015 with a diagnosis of <principal problem not specified> and went to the operating room on * No surgery found * and underwent the above named procedures.    Surgeries:  on 04/11/2015. Left total knee arthroplasty Patient tolerated the surgery well. Taken to PACU where she was stabilized and then transferred to the orthopedic floor.  Started on Lovenox 30mg  q 12 hrs. Foot pumps applied bilaterally at 80 mm. Heels elevated on bed with rolled towels. No evidence of DVT. Negative Homan. Physical therapy started on day #1 for gait training and transfer. OT started day #1 for ADL and assisted devices.  Patient's IV , foley and hemovac was d/c on day #2.   Implants: Medacta GMK sphere size 3+ left, a size 3 left fixed tibial tray with a 11 x 30 mm stem, a 10 mm flex insert and a size 2 patella  She was given perioperative antibiotics:  Anti-infectives    None    .  She was given sequential compression devices, early ambulation, and Lovenox for DVT prophylaxis.  She benefited maximally from the hospital stay and there were no complications.    Recent vital signs:  Filed Vitals:   04/15/15 0749  BP: 145/48  Pulse: 62  Temp: 98.6 F (37 C)  Resp: 18    Recent laboratory studies:  Lab Results  Component Value Date   HGB 9.8* 04/13/2015   HGB 10.2* 04/12/2015   HGB 12.7 03/27/2015   Lab Results  Component Value Date   WBC 7.3 03/27/2015   PLT 178  04/12/2015   Lab Results  Component Value Date   INR 1.0 03/27/2015   Lab Results  Component Value Date   NA 130* 04/12/2015   K 4.3 04/12/2015   CL 98* 04/12/2015   CO2 25 04/12/2015   BUN 14 04/12/2015   CREATININE 0.79 04/12/2015   GLUCOSE 102* 04/12/2015    Discharge Medications:     Medication List    TAKE these medications        acetaminophen 500 MG tablet  Commonly known as:  TYLENOL  Take 1-2 tablets (500-1,000 mg total) by mouth every 4 (four) hours as needed for fever.     aspirin EC 81 MG tablet  Take 81 mg by mouth daily.     atenolol 25 MG tablet  Commonly known as:  TENORMIN  Take 25 mg by mouth at bedtime.     calcium carbonate 600 MG Tabs tablet  Commonly known as:  OS-CAL  Take 600 mg by mouth 1 day or 1 dose.     CRANBERRY PO  Take 1 capsule by mouth 1 day or 1 dose.     enoxaparin 30 MG/0.3ML injection  Commonly known as:  LOVENOX  Inject 0.4 mLs (40 mg total) into the skin daily.     Fish Oil 1200 MG Caps  Take 1 capsule by mouth 2 (two) times daily.     HYDROcodone-acetaminophen 5-325 MG per tablet  Commonly known  as:  NORCO  Take 1-2 tablets by mouth every 4 (four) hours as needed for moderate pain.     LORazepam 0.5 MG tablet  Commonly known as:  ATIVAN  Take 0.5 mg by mouth 2 (two) times daily as needed for anxiety.     naproxen sodium 220 MG tablet  Commonly known as:  ANAPROX  Take 220 mg by mouth at bedtime as needed.     omeprazole 20 MG capsule  Commonly known as:  PRILOSEC  Take 20 mg by mouth 2 (two) times daily.     simvastatin 20 MG tablet  Commonly known as:  ZOCOR  Take 20 mg by mouth at bedtime.     traMADol 50 MG tablet  Commonly known as:  ULTRAM  Take 1 tablet (50 mg total) by mouth every 6 (six) hours as needed for moderate pain.     TRIAMTERENE-HCTZ PO  Take 25-37.5 capsules by mouth 1 day or 1 dose.     vitamin B-12 1000 MCG tablet  Commonly known as:  CYANOCOBALAMIN  Take 1,000 mcg by mouth daily.      Vitamin D3 2000 UNITS Tabs  Take by mouth daily.     vitamin E 400 UNIT capsule  Take 400 Units by mouth daily.        Diagnostic Studies: Dg Knee 1-2 Views Left  Apr 21, 2015   CLINICAL DATA:  Knee replacement  EXAM: LEFT KNEE - 1-2 VIEW  COMPARISON:  02/20/2015  FINDINGS: Total knee arthroplasty has been performed. Anatomic alignment of the osseous and prostatic structures. Gas in the knee joint. No breakage or loosening of the hardware.  IMPRESSION: Total knee arthroplasty anatomically aligned.   Electronically Signed   By: Marybelle Killings M.D.   On: 21-Apr-2015 11:00    Disposition: To sister's home with Home Health      Discharge Instructions    Diet - low sodium heart healthy    Complete by:  As directed      Increase activity slowly    Complete by:  As directed            Follow-up Information    Follow up with Nyana Haren, MD. Call in 3 days.   Specialty:  Orthopedic Surgery   Why:  If symptoms worsen and for scheduled appt   Contact information:   [address] [place] [address] [phone]        Signed: Watt Climes. 04/15/2015, 11:11 AM

## 2015-04-15 NOTE — Care Management Note (Signed)
Case Management Note  Patient Details  Name: Kristen Powell MRN: [id number] Date of Birth: 06-20-1946  Subjective/Objective:                    Action/Plan:   Expected Discharge Date:                  Expected Discharge Plan:  [place]  In-House Referral:     Discharge planning Services  CM Consult  Post Acute Care Choice:  NA Choice offered to:  Patient  DME Arranged:    DME Agency:    HH Arranged:  PT HH Agency:   [facility]  Status of Service:  In process, will continue to follow  Medicare Important Message Given:  Yes Date Medicare IM Given:  04/12/15 Medicare IM give by:  Marshell Garfinkel Date Additional Medicare IM Given:    Additional Medicare Important Message give by:     If discussed at [place] of Stay Meetings, dates discussed:    Additional Comments: [place]ley assessment  Created by : Marshell Garfinkel Date/Time 2015-04-12 10:45:56.000 Note: Met with patient and her sister Patsy to discuss discharge planning. Patient will be going to her other sister's address in [place] and her name is Kristen Powell. I have notified [facility] health of this address and contact update since patient has picked them for home health. Lovenox 60m #14 called in to [facility] Phone: [phone] She states she has a rolling walker available to use at home. RNCM to continue to follow.  Electronically signed by:  AMarshell Garfinkel 04/15/15 Met with patient again. She has obtained her Lovenox approx. $90. [facility] [employer] last RNCM note (above). [facility].   AMarshell Garfinkel RN 04/15/2015, 10:29 AM

## 2015-04-15 NOTE — Progress Notes (Signed)
Patient plans to return to [facility] past d/c. Mild cough . Afebrile. Pleasant and alert. Up with minimal assist.Tylenol po given on request . Sleeping well

## 2015-04-15 NOTE — Progress Notes (Signed)
Patient resting in bed. Pain controlled. Plan is to d/c home with home health today at sisters.

## 2015-04-15 NOTE — Progress Notes (Signed)
Physical Therapy Treatment Patient Details Name: Kristen Powell MRN: [id number] DOB: 04/22/1946 Today's Date: 04/15/2015    History of Present Illness 69 yo female with new L TKA and PMHx:  OA,     PT Comments    Pt was seen for continuing efforts to increase her ability but is having trouble with dizziness.  MD came in and will address with her meds.  Planning to try session again in awhile and try to get her DC home completed.  Follow Up Recommendations  Home health PT;Supervision/Assistance - 24 hour     Equipment Recommendations  Rolling walker with 5" wheels    Recommendations for Other Services       Precautions / Restrictions Precautions Precautions: Fall;Knee Precaution Booklet Issued: Yes (comment) Restrictions Weight Bearing Restrictions: Yes LLE Weight Bearing: Weight bearing as tolerated    Mobility  Bed Mobility Overal bed mobility: Needs Assistance Bed Mobility: Supine to Sit;Sit to Supine Rolling: Min guard;Min assist   Supine to sit: Min guard;Min assist Sit to supine: Min guard;Min assist   General bed mobility comments: Pt is agitated about her meds and feeling off, limited ability to work with PT  Transfers Overall transfer level: Needs assistance Equipment used: Rolling walker (2 wheeled) Transfers: Sit to/from [employer] to Stand: Min guard Stand pivot transfers: Min guard       General transfer comment: Pt using hands on bed when PT reminds her  Ambulation/Gait Ambulation/Gait assistance: Min guard;Supervision Ambulation Distance (Feet): 150 Feet Assistive device: Rolling walker (2 wheeled);1 person hand held assist Gait Pattern/deviations: Step-to pattern;Decreased step length - right;Decreased stance time - left;Decreased dorsiflexion - left;Decreased dorsiflexion - right;Trunk flexed;Narrow base of support Gait velocity: reduced Gait velocity interpretation: Below normal speed for age/gender General Gait Details:  continuing to have med issues that limit her excursion, spoke with MD who will change her orders.   Stairs            Wheelchair Mobility    Modified Rankin (Stroke Patients Only)       Balance Overall balance assessment: Needs assistance Sitting-balance support: Feet supported Sitting balance-Leahy Scale: Good   Postural control: Posterior lean Standing balance support: Bilateral upper extremity supported Standing balance-Leahy Scale: Fair Standing balance comment: continuing to remind her about using LLE more to wb and control standing balance                    Cognition Arousal/Alertness: Awake/alert Behavior During Therapy: WFL for tasks assessed/performed Overall Cognitive Status: Within Functional Limits for tasks assessed                      Exercises Total Joint Exercises Ankle Circles/Pumps: AROM;Both;10 reps Quad Sets: AROM;Both;10 reps Heel Slides: AAROM;AROM;Both;10 reps Goniometric ROM: -10 and 85    General Comments General comments (skin integrity, edema, etc.): MD is altering orders for meds today to address her limited ability to walk without having symptoms.  Pt is going to be dc'd only if she walks the longer distances and climbs stairs      Pertinent Vitals/Pain Pain Assessment: Faces Pain Score: 6  Faces Pain Scale: Hurts even more Pain Location: L knee Pain Intervention(s): Limited activity within patient's tolerance;Monitored during session;Premedicated before session;Repositioned;Ice applied    Home Living                      Prior Function  PT Goals (current goals can now be found in the care plan section) Acute Rehab PT Goals Patient Stated Goal: To continue to work on going home Progress towards PT goals: Progressing toward goals    Frequency  BID    PT Plan Current plan remains appropriate    Co-evaluation             End of Session Equipment Utilized During Treatment: Gait  belt Activity Tolerance: Other (comment);Patient tolerated treatment well (limited by dizziness) Patient left: in bed;with call bell/phone within reach;with bed alarm set;with family/visitor present;Other (comment) (Refused to sit up in chair)     Time: 9163-8466 PT Time Calculation (min) (ACUTE ONLY): 30 min  Charges:  $Gait Training: 8-22 mins $Therapeutic Exercise: 8-22 mins                    G Codes:      Ramond Dial Apr 17, 2015, 12:27 PM   Mee Hives, PT MS Acute Rehab Dept. Number: [phone]

## 2015-04-15 NOTE — Progress Notes (Signed)
Patient discharged home with home health. Instructions and prescriptions given to pt, verbalized understanding. Transportation via sister.

## 2015-04-15 NOTE — Progress Notes (Signed)
Clinical Social Worker (CSW) received SNF consult. PT is recommending home health. RN Case Manager aware of above. Please reconsult if future social work needs arise. CSW signing off.   Keenon Leitzel Morgan, LCSWA [phone] 

## 2015-04-16 DIAGNOSIS — Z96652 Presence of left artificial knee joint: Secondary | ICD-10-CM | POA: Diagnosis not present

## 2015-04-16 DIAGNOSIS — R269 Unspecified abnormalities of gait and mobility: Secondary | ICD-10-CM | POA: Diagnosis not present

## 2015-04-16 DIAGNOSIS — Z471 Aftercare following joint replacement surgery: Secondary | ICD-10-CM | POA: Diagnosis not present

## 2015-04-17 DIAGNOSIS — Z471 Aftercare following joint replacement surgery: Secondary | ICD-10-CM | POA: Diagnosis not present

## 2015-04-17 DIAGNOSIS — R269 Unspecified abnormalities of gait and mobility: Secondary | ICD-10-CM | POA: Diagnosis not present

## 2015-04-17 DIAGNOSIS — Z96652 Presence of left artificial knee joint: Secondary | ICD-10-CM | POA: Diagnosis not present

## 2015-04-19 DIAGNOSIS — R269 Unspecified abnormalities of gait and mobility: Secondary | ICD-10-CM | POA: Diagnosis not present

## 2015-04-19 DIAGNOSIS — Z96652 Presence of left artificial knee joint: Secondary | ICD-10-CM | POA: Diagnosis not present

## 2015-04-19 DIAGNOSIS — Z471 Aftercare following joint replacement surgery: Secondary | ICD-10-CM | POA: Diagnosis not present

## 2015-04-22 DIAGNOSIS — R269 Unspecified abnormalities of gait and mobility: Secondary | ICD-10-CM | POA: Diagnosis not present

## 2015-04-22 DIAGNOSIS — Z96652 Presence of left artificial knee joint: Secondary | ICD-10-CM | POA: Diagnosis not present

## 2015-04-22 DIAGNOSIS — Z471 Aftercare following joint replacement surgery: Secondary | ICD-10-CM | POA: Diagnosis not present

## 2015-04-23 DIAGNOSIS — Z96652 Presence of left artificial knee joint: Secondary | ICD-10-CM | POA: Diagnosis not present

## 2015-04-23 DIAGNOSIS — Z471 Aftercare following joint replacement surgery: Secondary | ICD-10-CM | POA: Diagnosis not present

## 2015-04-23 DIAGNOSIS — R269 Unspecified abnormalities of gait and mobility: Secondary | ICD-10-CM | POA: Diagnosis not present

## 2015-04-24 DIAGNOSIS — Z96652 Presence of left artificial knee joint: Secondary | ICD-10-CM | POA: Diagnosis not present

## 2015-04-24 DIAGNOSIS — R269 Unspecified abnormalities of gait and mobility: Secondary | ICD-10-CM | POA: Diagnosis not present

## 2015-04-24 DIAGNOSIS — Z471 Aftercare following joint replacement surgery: Secondary | ICD-10-CM | POA: Diagnosis not present

## 2015-04-25 DIAGNOSIS — Z96652 Presence of left artificial knee joint: Secondary | ICD-10-CM | POA: Diagnosis not present

## 2015-04-29 DIAGNOSIS — Z96652 Presence of left artificial knee joint: Secondary | ICD-10-CM | POA: Diagnosis not present

## 2015-05-01 DIAGNOSIS — Z96652 Presence of left artificial knee joint: Secondary | ICD-10-CM | POA: Diagnosis not present

## 2015-05-03 DIAGNOSIS — Z96652 Presence of left artificial knee joint: Secondary | ICD-10-CM | POA: Diagnosis not present

## 2015-05-07 ENCOUNTER — Ambulatory Visit: Payer: Medicare Other | Admitting: Anesthesiology

## 2015-05-07 ENCOUNTER — Encounter
Admission: RE | Disposition: A | Discharge status: Home / Self Care | Payer: Self-pay | Source: Ambulatory Visit | Attending: Orthopedic Surgery

## 2015-05-07 ENCOUNTER — Encounter: Payer: Self-pay | Admitting: Anesthesiology

## 2015-05-07 ENCOUNTER — Ambulatory Visit
Admission: RE | Admit: 2015-05-07 | Discharge: 2015-05-07 | Disposition: A | Discharge status: Home / Self Care | Payer: Medicare Other | Source: Ambulatory Visit | Attending: Orthopedic Surgery | Admitting: Orthopedic Surgery

## 2015-05-07 DIAGNOSIS — F419 Anxiety disorder, unspecified: Secondary | ICD-10-CM | POA: Diagnosis not present

## 2015-05-07 DIAGNOSIS — Z79891 Long term (current) use of opiate analgesic: Secondary | ICD-10-CM | POA: Insufficient documentation

## 2015-05-07 DIAGNOSIS — E785 Hyperlipidemia, unspecified: Secondary | ICD-10-CM | POA: Insufficient documentation

## 2015-05-07 DIAGNOSIS — Z7982 Long term (current) use of aspirin: Secondary | ICD-10-CM | POA: Insufficient documentation

## 2015-05-07 DIAGNOSIS — M25662 Stiffness of left knee, not elsewhere classified: Secondary | ICD-10-CM | POA: Insufficient documentation

## 2015-05-07 DIAGNOSIS — Z79899 Other long term (current) drug therapy: Secondary | ICD-10-CM | POA: Diagnosis not present

## 2015-05-07 DIAGNOSIS — I10 Essential (primary) hypertension: Secondary | ICD-10-CM | POA: Diagnosis not present

## 2015-05-07 DIAGNOSIS — Z96652 Presence of left artificial knee joint: Secondary | ICD-10-CM | POA: Insufficient documentation

## 2015-05-07 DIAGNOSIS — K219 Gastro-esophageal reflux disease without esophagitis: Secondary | ICD-10-CM | POA: Diagnosis not present

## 2015-05-07 DIAGNOSIS — M9689 Other intraoperative and postprocedural complications and disorders of the musculoskeletal system: Secondary | ICD-10-CM | POA: Diagnosis not present

## 2015-05-07 HISTORY — PX: KNEE CLOSED REDUCTION: SHX995

## 2015-05-07 LAB — SURGICAL PATHOLOGY

## 2015-05-07 SURGERY — MANIPULATION, KNEE, CLOSED
Anesthesia: General | Site: Knee | Wound class: Clean

## 2015-05-07 MED ORDER — SODIUM CHLORIDE 0.9 % IV SOLN: INTRAVENOUS | Status: DC

## 2015-05-07 MED ORDER — ONDANSETRON HCL 4 MG PO TABS: 4.0000 mg | ORAL_TABLET | Freq: Four times a day (QID) | ORAL | Status: DC | PRN

## 2015-05-07 MED ORDER — ONDANSETRON HCL 4 MG/2ML IJ SOLN: 4.0000 mg | Freq: Once | INTRAMUSCULAR | Status: DC | PRN

## 2015-05-07 MED ORDER — HYDROCODONE-ACETAMINOPHEN 5-325 MG PO TABS: 1.0000 | ORAL_TABLET | ORAL | Status: DC | PRN

## 2015-05-07 MED ORDER — FENTANYL CITRATE (PF) 100 MCG/2ML IJ SOLN
INTRAMUSCULAR | Status: AC
  Administered 2015-05-07: 25 ug via INTRAVENOUS
  Filled 2015-05-07: qty 2

## 2015-05-07 MED ORDER — FENTANYL CITRATE (PF) 100 MCG/2ML IJ SOLN
25.0000 ug | INTRAMUSCULAR | Status: AC | PRN
  Administered 2015-05-07 (×6): 25 ug via INTRAVENOUS

## 2015-05-07 MED ORDER — LACTATED RINGERS IV SOLN
INTRAVENOUS | Status: DC
Start: 2015-05-07 — End: 2015-05-07
  Administered 2015-05-07 (×2): via INTRAVENOUS

## 2015-05-07 MED ORDER — PROPOFOL 10 MG/ML IV BOLUS
INTRAVENOUS | Status: DC | PRN
  Administered 2015-05-07: 70 mg via INTRAVENOUS

## 2015-05-07 MED ORDER — FENTANYL CITRATE (PF) 100 MCG/2ML IJ SOLN
INTRAMUSCULAR | Status: DC | PRN
  Administered 2015-05-07: 50 ug via INTRAVENOUS

## 2015-05-07 MED ORDER — MIDAZOLAM HCL 2 MG/2ML IJ SOLN
INTRAMUSCULAR | Status: DC | PRN
  Administered 2015-05-07: 2 mg via INTRAVENOUS

## 2015-05-07 MED ORDER — ONDANSETRON HCL 4 MG/2ML IJ SOLN: 4.0000 mg | Freq: Four times a day (QID) | INTRAMUSCULAR | Status: DC | PRN

## 2015-05-07 SURGICAL SUPPLY — 1 items: KIT RM TURNOVER STRD PROC AR (KITS) ×3 IMPLANT

## 2015-05-07 NOTE — H&P (Signed)
Reviewed paper H+P, will be scanned into chart. No changes noted.  

## 2015-05-07 NOTE — Discharge Instructions (Signed)
Work on range of motion is much as possible today keep [place] on to minimize swelling.

## 2015-05-07 NOTE — Anesthesia Preprocedure Evaluation (Addendum)
Anesthesia Evaluation  Patient identified by MRN, date of birth, ID band Patient awake    Reviewed: Allergy & Precautions, NPO status , Patient's Chart, lab work & pertinent test results  Airway Mallampati: III  TM Distance: >3 FB Neck ROM: Full    Dental  (+) Upper Dentures, Lower Dentures   Pulmonary          Cardiovascular hypertension, Pt. on home beta blockers and Pt. on medications     Neuro/Psych    GI/Hepatic GERD-  Medicated and Controlled,  Endo/Other  Hypothyroidism   Renal/GU      Musculoskeletal   Abdominal   Peds  Hematology   Anesthesia Other Findings   Reproductive/Obstetrics                           Anesthesia Physical Anesthesia Plan  ASA: II  Anesthesia Plan: General   Post-op Pain Management:    Induction: Intravenous  Airway Management Planned: LMA  Additional Equipment:   Intra-op Plan:   Post-operative Plan:   Informed Consent: I have reviewed the patients History and Physical, chart, labs and discussed the procedure including the risks, benefits and alternatives for the proposed anesthesia with the patient or authorized representative who has indicated his/her understanding and acceptance.     Plan Discussed with:   Anesthesia Plan Comments:         Anesthesia Quick Evaluation

## 2015-05-07 NOTE — Op Note (Signed)
05/07/2015  10:10 AM  PATIENT:  Kristen Powell  69 y.o. female  PRE-OPERATIVE DIAGNOSIS:  STIFF KNEE POST ARTHROPLASTY  POST-OPERATIVE DIAGNOSIS:  STIFF KNEE POST ARTHROPLASTY  PROCEDURE:  Procedure(s): CLOSED MANIPULATION KNEE  SURGEON: Laurene Footman, MD  ASSISTANTS: None  ANESTHESIA:   MAC  EBL:     BLOOD ADMINISTERED:none  DRAINS: none   LOCAL MEDICATIONS USED:  NONE  SPECIMEN:  No Specimen  DISPOSITION OF SPECIMEN:  N/A  COUNTS:  NO No incision made cement count taken and no equipment required  TOURNIQUET:  * No tourniquets in log *  IMPLANTS: None  DICTATION: .Dragon Dictation patient brought to the operating room and after adequate anesthesia was obtained. Patient education timeout procedure completed. The leg was brought into extension and then passive flexion was noted to be approximately 80. With gentle pressure to the proximal tibia flexion slowly could be obtained to 120. With passive flexion of 110.  There are no consultations patient was sent to recovery in stable condition  PLAN OF CARE: Discharge to home after PACU  PATIENT DISPOSITION:  PACU - hemodynamically stable.

## 2015-05-07 NOTE — Transfer of Care (Signed)
Immediate Anesthesia Transfer of Care Note  Patient: Kristen Powell  Procedure(s) Performed: Procedure(s): CLOSED MANIPULATION KNEE  Patient Location: PACU  Anesthesia Type:MAC  Level of Consciousness: awake, alert  and oriented  Airway & Oxygen Therapy: Patient connected to face mask oxygen  Post-op Assessment: Report given to RN and Post -op Vital signs reviewed and stable  Post vital signs: Reviewed and stable  Last Vitals:  Filed Vitals:   05/07/15 0821  BP: 162/72  Pulse: 80  Temp: 36.7 C  Resp: 16    Complications: No apparent anesthesia complications

## 2015-05-07 NOTE — Anesthesia Procedure Notes (Signed)
Procedure Name: MAC Date/Time: 05/07/2015 9:57 AM Performed by: Nelda Marseille Pre-anesthesia Checklist: Patient identified, Emergency Drugs available, Suction available, Patient being monitored and Timeout performed Patient Re-evaluated:Patient Re-evaluated prior to inductionOxygen Delivery Method: Simple face mask

## 2015-05-07 NOTE — Anesthesia Postprocedure Evaluation (Signed)
  Anesthesia Post-op Note  Patient: Kristen Powell  Procedure(s) Performed: Procedure(s): CLOSED MANIPULATION KNEE  Anesthesia type:General  Patient location: PACU  Post pain: Pain level controlled  Post assessment: Post-op Vital signs reviewed, Patient's Cardiovascular Status Stable, Respiratory Function Stable, Patent Airway and No signs of Nausea or vomiting  Post vital signs: Reviewed and stable  Last Vitals:  Filed Vitals:   05/07/15 1015  BP: 154/51  Pulse: 67  Temp:   Resp: 16    Level of consciousness: awake, alert  and patient cooperative  Complications: No apparent anesthesia complications

## 2015-05-08 ENCOUNTER — Encounter: Payer: Self-pay | Admitting: Orthopedic Surgery

## 2015-05-08 DIAGNOSIS — Z96652 Presence of left artificial knee joint: Secondary | ICD-10-CM | POA: Diagnosis not present

## 2015-05-10 DIAGNOSIS — Z96652 Presence of left artificial knee joint: Secondary | ICD-10-CM | POA: Diagnosis not present

## 2015-05-14 DIAGNOSIS — Z96652 Presence of left artificial knee joint: Secondary | ICD-10-CM | POA: Diagnosis not present

## 2015-05-15 DIAGNOSIS — Z96652 Presence of left artificial knee joint: Secondary | ICD-10-CM | POA: Diagnosis not present

## 2015-05-16 NOTE — Addendum Note (Signed)
Addendum  created 05/16/15 0919 by Gunnar Fusi, MD   Modules edited: Anesthesia Attestations, Anesthesia Events, Narrator   Narrator:  Narrator: Event Log Edited

## 2015-05-17 DIAGNOSIS — Z96652 Presence of left artificial knee joint: Secondary | ICD-10-CM | POA: Diagnosis not present

## 2015-05-21 DIAGNOSIS — Z96652 Presence of left artificial knee joint: Secondary | ICD-10-CM | POA: Diagnosis not present

## 2015-05-22 DIAGNOSIS — Z96652 Presence of left artificial knee joint: Secondary | ICD-10-CM | POA: Diagnosis not present

## 2015-05-24 DIAGNOSIS — Z96652 Presence of left artificial knee joint: Secondary | ICD-10-CM | POA: Diagnosis not present

## 2015-05-27 DIAGNOSIS — Z96652 Presence of left artificial knee joint: Secondary | ICD-10-CM | POA: Diagnosis not present

## 2015-05-29 DIAGNOSIS — Z96652 Presence of left artificial knee joint: Secondary | ICD-10-CM | POA: Diagnosis not present

## 2015-06-11 ENCOUNTER — Other Ambulatory Visit: Payer: Self-pay | Admitting: Internal Medicine

## 2015-07-02 ENCOUNTER — Other Ambulatory Visit: Payer: Self-pay | Admitting: Internal Medicine

## 2015-07-02 NOTE — Telephone Encounter (Signed)
Ok to phone in Ativan 

## 2015-07-02 NOTE — Telephone Encounter (Signed)
Ok to phone in ativan 

## 2015-07-02 NOTE — Telephone Encounter (Signed)
Electronic refill request. Last Filled:    45 tablet 0 RF on 03/20/2015  Please advise.

## 2015-07-03 NOTE — Telephone Encounter (Signed)
Rx called in to pharmacy. 

## 2015-07-17 ENCOUNTER — Other Ambulatory Visit: Payer: Self-pay | Admitting: Internal Medicine

## 2015-07-17 DIAGNOSIS — I10 Essential (primary) hypertension: Secondary | ICD-10-CM

## 2015-07-17 DIAGNOSIS — E785 Hyperlipidemia, unspecified: Secondary | ICD-10-CM

## 2015-07-17 DIAGNOSIS — E559 Vitamin D deficiency, unspecified: Secondary | ICD-10-CM

## 2015-07-17 DIAGNOSIS — Z0000 Encounter for general adult medical examination without abnormal findings: Secondary | ICD-10-CM

## 2015-07-22 ENCOUNTER — Other Ambulatory Visit (INDEPENDENT_AMBULATORY_CARE_PROVIDER_SITE_OTHER): Payer: Medicare Other

## 2015-07-22 DIAGNOSIS — I10 Essential (primary) hypertension: Secondary | ICD-10-CM

## 2015-07-22 DIAGNOSIS — E785 Hyperlipidemia, unspecified: Secondary | ICD-10-CM

## 2015-07-22 DIAGNOSIS — E559 Vitamin D deficiency, unspecified: Secondary | ICD-10-CM | POA: Diagnosis not present

## 2015-07-22 LAB — COMPREHENSIVE METABOLIC PANEL
ALT: 12 U/L (ref 0–35)
AST: 14 U/L (ref 0–37)
Albumin: 3.9 g/dL (ref 3.5–5.2)
Alkaline Phosphatase: 62 U/L (ref 39–117)
BUN: 18 mg/dL (ref 6–23)
CO2: 27 mEq/L (ref 19–32)
CREATININE: 0.81 mg/dL (ref 0.40–1.20)
Calcium: 9.5 mg/dL (ref 8.4–10.5)
Chloride: 101 mEq/L (ref 96–112)
GFR: 74.4 mL/min (ref >60.00)
GLUCOSE: 98 mg/dL (ref 70–99)
Potassium: 4.8 mEq/L (ref 3.5–5.1)
Sodium: 135 mEq/L (ref 135–145)
TOTAL PROTEIN: 7.1 g/dL (ref 6.0–8.3)
Total Bilirubin: 0.3 mg/dL (ref 0.2–1.2)

## 2015-07-22 LAB — LIPID PANEL
CHOL/HDL RATIO: 4
Cholesterol: 205 mg/dL — ABNORMAL HIGH (ref 0–200)
HDL: 46.2 mg/dL (ref >39.00)
LDL Cholesterol: 123 mg/dL — ABNORMAL HIGH (ref 0–99)
NonHDL: 158.63
Triglycerides: 176 mg/dL — ABNORMAL HIGH (ref 0.0–149.0)
VLDL: 35.2 mg/dL (ref 0.0–40.0)

## 2015-07-22 LAB — CBC
HCT: 38 % (ref 36.0–46.0)
Hemoglobin: 12.7 g/dL (ref 12.0–15.0)
MCHC: 33.5 g/dL (ref 30.0–36.0)
MCV: 95 fl (ref 78.0–100.0)
Platelets: 170 10*3/uL (ref 150.0–400.0)
RBC: 4 Mil/uL (ref 3.87–5.11)
RDW: 13.8 % (ref 11.5–15.5)
WBC: 6.1 10*3/uL (ref 4.0–10.5)

## 2015-07-22 LAB — VITAMIN D 25 HYDROXY (VIT D DEFICIENCY, FRACTURES): VITD: 25.44 ng/mL — ABNORMAL LOW (ref 30.00–100.00)

## 2015-07-25 ENCOUNTER — Ambulatory Visit (INDEPENDENT_AMBULATORY_CARE_PROVIDER_SITE_OTHER): Payer: Medicare Other | Admitting: Internal Medicine

## 2015-07-25 ENCOUNTER — Encounter: Payer: Self-pay | Admitting: Internal Medicine

## 2015-07-25 VITALS — BP 140/78 | HR 89 | Temp 98.2°F | Ht 60.5 in | Wt 157.0 lb

## 2015-07-25 DIAGNOSIS — Z23 Encounter for immunization: Secondary | ICD-10-CM | POA: Diagnosis not present

## 2015-07-25 DIAGNOSIS — E669 Obesity, unspecified: Secondary | ICD-10-CM | POA: Diagnosis not present

## 2015-07-25 DIAGNOSIS — K219 Gastro-esophageal reflux disease without esophagitis: Secondary | ICD-10-CM | POA: Diagnosis not present

## 2015-07-25 DIAGNOSIS — I10 Essential (primary) hypertension: Secondary | ICD-10-CM

## 2015-07-25 DIAGNOSIS — E785 Hyperlipidemia, unspecified: Secondary | ICD-10-CM

## 2015-07-25 DIAGNOSIS — Z0000 Encounter for general adult medical examination without abnormal findings: Secondary | ICD-10-CM

## 2015-07-25 DIAGNOSIS — F411 Generalized anxiety disorder: Secondary | ICD-10-CM

## 2015-07-25 DIAGNOSIS — Z96652 Presence of left artificial knee joint: Secondary | ICD-10-CM

## 2015-07-25 NOTE — Patient Instructions (Signed)

## 2015-07-25 NOTE — Assessment & Plan Note (Signed)
Discussed adherence with a low fat, low cholesterol diet Will continue Zocor at current dose at this time Lipid profile and CMET reviewed

## 2015-07-25 NOTE — Assessment & Plan Note (Signed)
No issues at this time Will continue to monitor

## 2015-07-25 NOTE — Assessment & Plan Note (Signed)
Encouraged her to work on diet and exercise 

## 2015-07-25 NOTE — Assessment & Plan Note (Signed)
Controlled with Ativan CSA has been signed UDS reviewed

## 2015-07-25 NOTE — Progress Notes (Signed)
Pre visit review using our clinic review tool, if applicable. No additional management support is needed unless otherwise documented below in the visit note. 

## 2015-07-25 NOTE — Progress Notes (Signed)
HPI:  Pt presents to the clinic today for her Medicare Wellness Exam. She is also due for 6 month follow up of chronic conditions.  GAD: She is now only taking the Ativan as needed during the day and 1 tab at night to help her sleep.  GERD: She switched from Nexium to Prilosec BID due to cost of the medication. Symptoms well controlled on Prilosec. Denies breakthrough symptoms.  HLD: LDL 123, Triglycerides 176. Denies myalgias on Zocor. Taking Fish Oil daily as well. Tries to consume a low fat diet but reports she has not been as vigilant in the last few weeks.  HTN: BP well controlled on Triamterene-HCTZ and Metoprolol. BP today 140/78.   Obesity: Has lost 10 lbs in the last 6 months. BMI is 30.16. She uses a stationary bike and does leg exercises daily.  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 Her pain and mobility is much improved. She takes Norco prn for pain.  Past Medical History  Diagnosis Date  . Hypertension   . Hyperlipidemia   . Anxiety     Current Outpatient Prescriptions  Medication Sig Dispense Refill  . aspirin 81 MG tablet Take 81 mg by mouth daily.    Marland Kitchen atenolol (TENORMIN) 25 MG tablet Take 1 tablet (25 mg total) by mouth daily. 30 tablet 5  . calcium carbonate (OS-CAL) 600 MG TABS tablet Take 600 mg by mouth daily.    . Cholecalciferol (VITAMIN D) 2000 UNITS CAPS Take 1 capsule by mouth daily.    Marland Kitchen CRANBERRY PO Take 1 capsule by mouth daily. 4200mg     . ibuprofen (ADVIL,MOTRIN) 200 MG tablet Take 200 mg by mouth as needed.    Marland Kitchen LORazepam (ATIVAN) 0.5 MG tablet TAKE 1 TABLET BY MOUTH TWICE A DAY AS NEEDED 45 tablet 0  . Omega-3 Fatty Acids (FISH OIL) 1000 MG CAPS Take 1 capsule by mouth 2 (two) times daily.    Marland Kitchen omeprazole (PRILOSEC) 20 MG capsule TAKE 1 CAPSULE TWICE A DAY 60 capsule 11  . simvastatin (ZOCOR) 20 MG tablet Take 1 tablet (20 mg total) by mouth daily at 6 PM. 30 tablet 5  . triamterene-hydrochlorothiazide (DYAZIDE) 37.5-25 MG per capsule  Take 1 each (1 capsule total) by mouth daily. 90 capsule 1  . vitamin B-12 (CYANOCOBALAMIN) 1000 MCG tablet Take 1,000 mcg by mouth daily.    . vitamin E 400 UNIT capsule Take 400 Units by mouth daily.    . naproxen sodium (ANAPROX) 220 MG tablet Take 220 mg by mouth daily.     No current facility-administered medications for this visit.    No Known Allergies  Family History  Problem Relation Age of Onset  . Cancer Mother     lung  . Hypertension Sister     Social History   Social History  . Marital Status: Widowed    Spouse Name: N/A  . Number of Children: N/A  . Years of Education: N/A   Occupational History  . Not on file.   Social History Main Topics  . Smoking status: Never Smoker   . Smokeless tobacco: Never Used  . Alcohol Use: No  . Drug Use: No  . Sexual Activity: Not on file   Other Topics Concern  . Not on file   Social History Narrative    Hospitiliaztions:  03/2015 [place] 3 days for Left Knee TKR  Health Maintenance:    Flu: 07/2014  Tetanus: 12/2013  Pneumovax: never  Prevnar: 06/2014  Zostavax:  06/2014  Bone Density: never  Colonoscopy: never  Eye Doctor: yearly  Dental Exam: as needed, she has dentures  Mammogram: 02/2014  Pap: unsure, she has had a total hysterectomy   Providers:   PCP: [name], NP-C  Orthopedic: Dr. Rudene Christians  Dermatologist: Dr. Delice Lesch  Opthalmologist: Dr. Covert  I have personally reviewed and have noted:  1. The patient's medical and social history 2. Their use of alcohol, tobacco or illicit drugs 3. Their current medications and supplements 4. The patient's functional ability including ADL's, fall risks, home safety  risks and hearing or visual impairment. 5. Diet and physical activities 6. Evidence for depression or mood disorder  Subjective:   Review of Systems:   Constitutional: Denies fever, malaise, fatigue, headache or abrupt weight changes.  HEENT: Denies eye pain, eye redness, ear pain, ringing in the  ears, wax buildup, runny nose, nasal congestion, bloody nose, or sore throat. Respiratory: Denies difficulty breathing, shortness of breath, cough or sputum production.   Cardiovascular: Denies chest pain, chest tightness, palpitations or swelling in the hands or feet.  Gastrointestinal: Denies abdominal pain, bloating, constipation, diarrhea or blood in the stool.  GU: Denies urgency, frequency, pain with urination, burning sensation, blood in urine, odor or discharge. Musculoskeletal: Pt reports occasional left knee pain. Denies decrease in range of motion, difficulty with gait, muscle pain or joint swelling.  Skin: Denies redness, rashes, lesions or ulcercations.  Neurological: Denies dizziness, difficulty with memory, difficulty with speech or problems with balance and coordination.  Psych: Denies anxiety, depression, SI/HI.  No other specific complaints in a complete review of systems (except as listed in HPI above).  Objective:  PE:   BP 140/78 mmHg  Pulse 89  Temp(Src) 98.2 F (36.8 C) (Oral)  Ht 5' 0.5" (1.537 m)  Wt 157 lb (71.215 kg)  BMI 30.15 kg/m2  SpO2 98% Wt Readings from Last 3 Encounters:  07/25/15 157 lb (71.215 kg)  01/22/15 167 lb (75.751 kg)  08/13/14 169 lb (76.658 kg)    General: Appears her stated age, in NAD. Skin: Warm, dry and intact. No rashes, lesions or ulcerations noted. HEENT: Head: normal shape and size; Eyes: sclera white, no icterus, conjunctiva pink, PERRLA and EOMs intact;  Cardiovascular: Normal rate and rhythm. S1,S2 noted.  No murmur, rubs or gallops noted. No JVD or BLE edema. No carotid bruits noted. Pulmonary/Chest: Normal effort and positive vesicular breath sounds. No respiratory distress. No wheezes, rales or ronchi noted.  Musculoskeletal: Normal flexion but decreased extension of the left knee. No signs of joint swelling. No difficulty with gait.  Neurological: Alert and oriented.  Psychiatric: Mood and affect normal. Behavior is  normal. Judgment and thought content normal.     BMET    Component Value Date/Time   NA 135 07/22/2015 0854   K 4.8 07/22/2015 0854   CL 101 07/22/2015 0854   CO2 27 07/22/2015 0854   GLUCOSE 98 07/22/2015 0854   BUN 18 07/22/2015 0854   CREATININE 0.81 07/22/2015 0854   CALCIUM 9.5 07/22/2015 0854    Lipid Panel     Component Value Date/Time   CHOL 205* 07/22/2015 0854   TRIG 176.0* 07/22/2015 0854   HDL 46.20 07/22/2015 0854   CHOLHDL 4 07/22/2015 0854   VLDL 35.2 07/22/2015 0854   LDLCALC 123* 07/22/2015 0854    CBC    Component Value Date/Time   WBC 6.1 07/22/2015 0854   RBC 4.00 07/22/2015 0854   HGB 12.7 07/22/2015 0854  HCT 38.0 07/22/2015 0854   PLT 170.0 07/22/2015 0854   MCV 95.0 07/22/2015 0854   MCHC 33.5 07/22/2015 0854   RDW 13.8 07/22/2015 0854    Hgb A1C No results found for: HGBA1C    Assessment and Plan:   Medicare Annual Wellness Visit:  Diet: She is trying to consume a heart healthy diet but reports she could be better at it. Physical activity: Stationary Bike, Leg lifts, Squats Depression/mood screen: Negative Hearing: Intact to whispered voice Visual acuity: Grossly normal, performs annual eye exam  ADLs: Capable Fall risk: None Home safety: Good Cognitive evaluation: Intact to orientation, naming, recall and repetition EOL planning: Living Will, full code/ I agree  Preventative Medicine: Pneumovax given today. She declines bone density, colonoscopy or IFOB.   Next appointment: 6 months, follow up chronic condtions

## 2015-07-25 NOTE — Assessment & Plan Note (Signed)
No issues on Prilosec CMET reviewed-normal Advised her to avoid foods that trigger her reflux

## 2015-07-25 NOTE — Assessment & Plan Note (Signed)
BP well controlled on Triamterene-HCT and Metoprolol Need to get ECG at her next visit CBC and CMET reviewed- normal

## 2015-07-28 ENCOUNTER — Other Ambulatory Visit: Payer: Self-pay | Admitting: Internal Medicine

## 2015-08-14 ENCOUNTER — Encounter: Payer: Self-pay | Admitting: Internal Medicine

## 2015-08-21 DIAGNOSIS — Z23 Encounter for immunization: Secondary | ICD-10-CM | POA: Diagnosis not present

## 2015-09-07 ENCOUNTER — Other Ambulatory Visit: Payer: Self-pay | Admitting: Internal Medicine

## 2015-09-11 ENCOUNTER — Other Ambulatory Visit: Payer: Self-pay | Admitting: Internal Medicine

## 2015-09-27 ENCOUNTER — Other Ambulatory Visit: Payer: Self-pay | Admitting: Internal Medicine

## 2015-09-27 NOTE — Telephone Encounter (Signed)
Rx called in to pharmacy. 

## 2015-09-27 NOTE — Telephone Encounter (Signed)
Last filled 07/02/2015--please advise

## 2015-09-27 NOTE — Telephone Encounter (Signed)
Ok to phone in Ativan 

## 2015-11-22 ENCOUNTER — Other Ambulatory Visit: Payer: Self-pay | Admitting: Internal Medicine

## 2015-11-22 NOTE — Telephone Encounter (Signed)
Last filled 09/27/2015--please advise 

## 2015-11-22 NOTE — Telephone Encounter (Signed)
Rx called in to pharmacy. 

## 2015-11-22 NOTE — Telephone Encounter (Signed)
Ok to phone in Ativan 

## 2015-12-17 DIAGNOSIS — L814 Other melanin hyperpigmentation: Secondary | ICD-10-CM | POA: Diagnosis not present

## 2015-12-17 DIAGNOSIS — D1801 Hemangioma of skin and subcutaneous tissue: Secondary | ICD-10-CM | POA: Diagnosis not present

## 2015-12-17 DIAGNOSIS — Z85828 Personal history of other malignant neoplasm of skin: Secondary | ICD-10-CM | POA: Diagnosis not present

## 2016-01-20 ENCOUNTER — Other Ambulatory Visit: Payer: Self-pay | Admitting: Internal Medicine

## 2016-01-27 ENCOUNTER — Encounter: Payer: Self-pay | Admitting: Internal Medicine

## 2016-01-27 ENCOUNTER — Ambulatory Visit (INDEPENDENT_AMBULATORY_CARE_PROVIDER_SITE_OTHER): Payer: Medicare Other | Admitting: Internal Medicine

## 2016-01-27 VITALS — BP 132/76 | HR 69 | Temp 98.9°F | Wt 160.0 lb

## 2016-01-27 DIAGNOSIS — F411 Generalized anxiety disorder: Secondary | ICD-10-CM | POA: Diagnosis not present

## 2016-01-27 DIAGNOSIS — K219 Gastro-esophageal reflux disease without esophagitis: Secondary | ICD-10-CM | POA: Diagnosis not present

## 2016-01-27 DIAGNOSIS — E669 Obesity, unspecified: Secondary | ICD-10-CM

## 2016-01-27 DIAGNOSIS — I10 Essential (primary) hypertension: Secondary | ICD-10-CM

## 2016-01-27 DIAGNOSIS — E785 Hyperlipidemia, unspecified: Secondary | ICD-10-CM

## 2016-01-27 DIAGNOSIS — Z96652 Presence of left artificial knee joint: Secondary | ICD-10-CM

## 2016-01-27 LAB — COMPREHENSIVE METABOLIC PANEL
ALT: 16 U/L (ref 0–35)
AST: 18 U/L (ref 0–37)
Albumin: 4.4 g/dL (ref 3.5–5.2)
Alkaline Phosphatase: 61 U/L (ref 39–117)
BILIRUBIN TOTAL: 0.4 mg/dL (ref 0.2–1.2)
BUN: 14 mg/dL (ref 6–23)
CALCIUM: 9.8 mg/dL (ref 8.4–10.5)
CO2: 28 meq/L (ref 19–32)
Chloride: 96 mEq/L (ref 96–112)
Creatinine, Ser: 0.87 mg/dL (ref 0.40–1.20)
GFR: 68.41 mL/min (ref >60.00)
Glucose, Bld: 96 mg/dL (ref 70–99)
POTASSIUM: 4.8 meq/L (ref 3.5–5.1)
Sodium: 131 mEq/L — ABNORMAL LOW (ref 135–145)
Total Protein: 7.8 g/dL (ref 6.0–8.3)

## 2016-01-27 LAB — LIPID PANEL
CHOL/HDL RATIO: 4
Cholesterol: 207 mg/dL — ABNORMAL HIGH (ref 0–200)
HDL: 54.1 mg/dL (ref >39.00)
LDL Cholesterol: 126 mg/dL — ABNORMAL HIGH (ref 0–99)
NonHDL: 153.14
TRIGLYCERIDES: 136 mg/dL (ref 0.0–149.0)
VLDL: 27.2 mg/dL (ref 0.0–40.0)

## 2016-01-27 MED ORDER — OMEPRAZOLE 20 MG PO CPDR: 20.0000 mg | DELAYED_RELEASE_CAPSULE | Freq: Two times a day (BID) | ORAL | Status: DC

## 2016-01-27 MED ORDER — LORAZEPAM 0.5 MG PO TABS: 0.5000 mg | ORAL_TABLET | Freq: Two times a day (BID) | ORAL | Status: DC | PRN

## 2016-01-27 NOTE — Assessment & Plan Note (Signed)
Controlled on Prilosec Discussed trying to cut back to 20 mg daily, but she wants to hold off on this Continue to avoid triggers CMET today

## 2016-01-27 NOTE — Assessment & Plan Note (Signed)
Will check CMET and Lipid Profile today. Advised her to consume a low fat diet Continue Zocor, Fish Oil and baby ASA daily

## 2016-01-27 NOTE — Progress Notes (Signed)
HPI:  Pt presents to the clinic today for 6 month follow up of chronic conditions.  GAD: She is only taking 1/2 tab of Ativan as needed during the day and 1 tab at night to help her sleep.  GERD: Symptoms well controlled on Prilosec. Denies breakthrough symptoms.  HLD: LDL 123, Triglycerides 176. Denies myalgias on Zocor. Taking Fish Oil daily as well. She has been better about consuming a low fat diet.  HTN: BP well controlled on Triamterene-HCTZ and Metoprolol. BP today 132/76. No ECG on file for review.  Obesity: Has gained 3lbs in the last 6 months. BMI is 30.16. She uses a stationary bike and does leg exercises daily.   Past Medical History  Diagnosis Date  . Hypertension   . Hyperlipidemia   . Anxiety     Current Outpatient Prescriptions  Medication Sig Dispense Refill  . acetaminophen (TYLENOL) 500 MG tablet Take 1-2 tablets (500-1,000 mg total) by mouth every 4 (four) hours as needed for fever. 30 tablet 0  . aspirin 81 MG tablet Take 81 mg by mouth daily.    Marland Kitchen atenolol (TENORMIN) 25 MG tablet TAKE 1 TABLET (25 MG TOTAL) BY MOUTH DAILY. 30 tablet 5  . calcium carbonate (OS-CAL) 600 MG TABS tablet Take 600 mg by mouth daily.    . Cholecalciferol (VITAMIN D) 2000 UNITS CAPS Take 1 capsule by mouth daily.    Marland Kitchen CRANBERRY PO Take 1 capsule by mouth daily. 4200mg     . HYDROcodone-acetaminophen (NORCO) 5-325 MG per tablet Take 1-2 tablets by mouth every 4 (four) hours as needed for moderate pain. 60 tablet 0  . ibuprofen (ADVIL,MOTRIN) 200 MG tablet Take 200 mg by mouth as needed.    Marland Kitchen LORazepam (ATIVAN) 0.5 MG tablet TAKE 1 TABLET BY MOUTH TWICE A DAY AS NEEDED 45 tablet 0  . naproxen sodium (ANAPROX) 220 MG tablet Take 220 mg by mouth daily.    . Omega-3 Fatty Acids (FISH OIL) 1200 MG CAPS Take 1 capsule by mouth 2 (two) times daily.    Marland Kitchen omeprazole (PRILOSEC) 20 MG capsule TAKE 1 CAPSULE TWICE A DAY 60 capsule 11  . simvastatin (ZOCOR) 20 MG tablet TAKE 1 TABLET BY MOUTH EVERY  DAY AT 6PM 30 tablet 0  . traMADol (ULTRAM) 50 MG tablet Take 1 tablet (50 mg total) by mouth every 6 (six) hours as needed for moderate pain. 60 tablet 0  . triamterene-hydrochlorothiazide (DYAZIDE) 37.5-25 MG capsule TAKE 1 CAPSULE BY MOUTH DAILY. 90 capsule 2  . vitamin B-12 (CYANOCOBALAMIN) 1000 MCG tablet Take 1,000 mcg by mouth daily.    . vitamin E 400 UNIT capsule Take 400 Units by mouth daily.     No current facility-administered medications for this visit.    Allergies  Allergen Reactions  . Adhesive [Tape] Rash    redness    Family History  Problem Relation Age of Onset  . Cancer Mother     lung  . Hypertension Sister     Social History   Social History  . Marital Status: Widowed    Spouse Name: N/A  . Number of Children: N/A  . Years of Education: N/A   Occupational History  . Not on file.   Social History Main Topics  . Smoking status: Never Smoker   . Smokeless tobacco: Never Used  . Alcohol Use: No  . Drug Use: No  . Sexual Activity: Not on file   Other Topics Concern  . Not on file  Social History Narrative   ** Merged History Encounter **          Subjective:   Review of Systems:   Constitutional: Denies fever, malaise, fatigue, headache or abrupt weight changes.  HEENT: Denies eye pain, eye redness, ear pain, ringing in the ears, wax buildup, runny nose, nasal congestion, bloody nose, or sore throat. Respiratory: Denies difficulty breathing, shortness of breath, cough or sputum production.   Cardiovascular: Denies chest pain, chest tightness, palpitations or swelling in the hands or feet.  Gastrointestinal: Denies abdominal pain, bloating, constipation, diarrhea or blood in the stool.  GU: Denies urgency, frequency, pain with urination, burning sensation, blood in urine, odor or discharge. Musculoskeletal:  Denies decrease in range of motion, difficulty with gait, muscle pain or joint swelling.  Skin: Denies redness, rashes, lesions or  ulcercations.  Neurological: Denies dizziness, difficulty with memory, difficulty with speech or problems with balance and coordination.  Psych: Denies anxiety, depression, SI/HI.  No other specific complaints in a complete review of systems (except as listed in HPI above).  Objective:  PE:   BP 132/76 mmHg  Pulse 69  Temp(Src) 98.9 F (37.2 C) (Oral)  Wt 160 lb (72.576 kg)  SpO2 98%  Wt Readings from Last 3 Encounters:  01/27/16 160 lb (72.576 kg)  07/25/15 157 lb (71.215 kg)  05/07/15 168 lb (76.204 kg)    General: Appears her stated age, in NAD. Skin: Warm, dry and intact. No rashes, lesions or ulcerations noted. Cardiovascular: Normal rate and rhythm. S1,S2 noted.  No murmur, rubs or gallops noted. No JVD or BLE edema. No carotid bruits noted. Pulmonary/Chest: Normal effort and positive vesicular breath sounds. No respiratory distress. No wheezes, rales or ronchi noted.  Abdomen: Soft, nontender. Active bowel sounds. No distention or masses noted. Neurological: Alert and oriented.  Psychiatric: Mood and affect normal. Behavior is normal. Judgment and thought content normal.     BMET    Component Value Date/Time   NA 135 07/22/2015 0854   NA 130* 04/12/2015 0501   K 4.8 07/22/2015 0854   K 4.3 04/12/2015 0501   CL 101 07/22/2015 0854   CL 98* 04/12/2015 0501   CO2 27 07/22/2015 0854   CO2 25 04/12/2015 0501   GLUCOSE 98 07/22/2015 0854   GLUCOSE 102* 04/12/2015 0501   BUN 18 07/22/2015 0854   BUN 14 04/12/2015 0501   CREATININE 0.81 07/22/2015 0854   CREATININE 0.79 04/12/2015 0501   CALCIUM 9.5 07/22/2015 0854   CALCIUM 8.0* 04/12/2015 0501   GFRNONAA >60 04/12/2015 0501   GFRAA >60 04/12/2015 0501    Lipid Panel     Component Value Date/Time   CHOL 205* 07/22/2015 0854   TRIG 176.0* 07/22/2015 0854   HDL 46.20 07/22/2015 0854   CHOLHDL 4 07/22/2015 0854   VLDL 35.2 07/22/2015 0854   LDLCALC 123* 07/22/2015 0854    CBC    Component Value  Date/Time   WBC 6.1 07/22/2015 0854   WBC 7.3 03/27/2015 1437   RBC 4.00 07/22/2015 0854   RBC 3.86 03/27/2015 1437   HGB 12.7 07/22/2015 0854   HGB 9.8* 04/13/2015 0444   HCT 38.0 07/22/2015 0854   HCT 37.5 03/27/2015 1437   PLT 170.0 07/22/2015 0854   PLT 178 04/12/2015 0501   MCV 95.0 07/22/2015 0854   MCV 97 03/27/2015 1437   MCH 32.8 03/27/2015 1437   MCHC 33.5 07/22/2015 0854   MCHC 33.8 03/27/2015 1437   RDW 13.8 07/22/2015 0854  RDW 12.7 03/27/2015 1437    Hgb A1C No results found for: HGBA1C    Assessment and Plan:

## 2016-01-27 NOTE — Progress Notes (Signed)
Pre visit review using our clinic review tool, if applicable. No additional management support is needed unless otherwise documented below in the visit note. 

## 2016-01-27 NOTE — Patient Instructions (Signed)

## 2016-01-27 NOTE — Assessment & Plan Note (Signed)
Improved

## 2016-01-27 NOTE — Assessment & Plan Note (Signed)
Continue Triamterene-HCT and Metoprolol Will get ECG at [facility] in 6 months CMET today

## 2016-01-27 NOTE — Assessment & Plan Note (Signed)
Controlled on daily Ativan She has been on this regimen for years, so will hold off on SSRI therapy Ativan refilled today

## 2016-01-27 NOTE — Assessment & Plan Note (Signed)
Encouraged her to continue to work on diet and exercise 

## 2016-02-17 ENCOUNTER — Other Ambulatory Visit: Payer: Self-pay | Admitting: Internal Medicine

## 2016-02-20 ENCOUNTER — Other Ambulatory Visit: Payer: Self-pay | Admitting: Internal Medicine

## 2016-02-20 NOTE — Telephone Encounter (Signed)
Last filled 01/27/16---please advise if okay to call in on 02/24/2016

## 2016-02-20 NOTE — Telephone Encounter (Signed)
Ok to phone in Ativan 

## 2016-02-24 ENCOUNTER — Other Ambulatory Visit: Payer: Self-pay | Admitting: Internal Medicine

## 2016-02-24 NOTE — Telephone Encounter (Signed)
Rx called in to pharmacy. 

## 2016-03-01 ENCOUNTER — Other Ambulatory Visit: Payer: Self-pay | Admitting: Internal Medicine

## 2016-03-02 NOTE — Telephone Encounter (Signed)
Is pt supposed to continue Atenolol---i looked in last note and did not see this medication mentioned in cont. med list--please advise

## 2016-03-02 NOTE — Telephone Encounter (Signed)
Yes, I accidentally wrote Metoprolol in my note but is should have said Atenolol. Sent electronically.

## 2016-03-12 ENCOUNTER — Encounter: Payer: Self-pay | Admitting: Internal Medicine

## 2016-03-12 ENCOUNTER — Ambulatory Visit (INDEPENDENT_AMBULATORY_CARE_PROVIDER_SITE_OTHER): Payer: Medicare Other | Admitting: Internal Medicine

## 2016-03-12 VITALS — BP 140/80 | HR 66 | Temp 97.9°F | Wt 160.0 lb

## 2016-03-12 DIAGNOSIS — R42 Dizziness and giddiness: Secondary | ICD-10-CM

## 2016-03-12 DIAGNOSIS — R450 Nervousness: Secondary | ICD-10-CM | POA: Diagnosis not present

## 2016-03-12 DIAGNOSIS — E162 Hypoglycemia, unspecified: Secondary | ICD-10-CM

## 2016-03-12 DIAGNOSIS — F411 Generalized anxiety disorder: Secondary | ICD-10-CM

## 2016-03-12 LAB — HEMOGLOBIN A1C: Hgb A1c MFr Bld: 5.8 % (ref 4.6–6.5)

## 2016-03-12 NOTE — Progress Notes (Signed)
Pre visit review using our clinic review tool, if applicable. No additional management support is needed unless otherwise documented below in the visit note. 

## 2016-03-12 NOTE — Progress Notes (Signed)
Subjective:    Patient ID: Kristen Powell, female    DOB: 12-Sep-1946, 70 y.o.   MRN: [id number]  HPI  Pt presents to the clinic today with c/o an episode of dizziness. This occurred yesterday. She did not feel like the room was spinning, but she felt like she needed to sit down or lay down, before she fell down. She did feel jittery, but denies blurred vision or claminess. She denies chest pain, plapitations or shortness of breath. She was on her way to pick up her grandson's fiance, [place] She had not eaten all day. She was feeling very anxious about the situation. She reports a friend gave her glucose tablets and she immediately felt better. She had a similar episode about 1 year ago, also during a very stressful situation. She is not sure if this related to anxiety or an episode of low blood sugar. She has never had an issue with low blood sugar before.  Review of Systems      Past Medical History  Diagnosis Date  . Hypertension   . Hyperlipidemia   . Anxiety     Current Outpatient Prescriptions  Medication Sig Dispense Refill  . acetaminophen (TYLENOL) 500 MG tablet Take 1-2 tablets (500-1,000 mg total) by mouth every 4 (four) hours as needed for fever. 30 tablet 0  . aspirin 81 MG tablet Take 81 mg by mouth daily.    Marland Kitchen atenolol (TENORMIN) 25 MG tablet TAKE 1 TABLET (25 MG TOTAL) BY MOUTH DAILY. 30 tablet 5  . calcium carbonate (OS-CAL) 600 MG TABS tablet Take 600 mg by mouth daily.    . Cholecalciferol (VITAMIN D) 2000 UNITS CAPS Take 1 capsule by mouth daily.    Marland Kitchen CRANBERRY PO Take 1 capsule by mouth daily. 4200mg     . ibuprofen (ADVIL,MOTRIN) 200 MG tablet Take 200 mg by mouth as needed.    Marland Kitchen LORazepam (ATIVAN) 0.5 MG tablet TAKE 1 TABLET BY MOUTH TWICE A DAY AS NEEDED 45 tablet 0  . Omega-3 Fatty Acids (FISH OIL) 1200 MG CAPS Take 1 capsule by mouth 2 (two) times daily.    Marland Kitchen omeprazole (PRILOSEC) 20 MG capsule Take 1 capsule (20 mg  total) by mouth 2 (two) times daily. 60 capsule 5  . simvastatin (ZOCOR) 20 MG tablet TAKE 1 TABLET BY MOUTH EVERY DAY AT 6PM 30 tablet 5  . triamterene-hydrochlorothiazide (DYAZIDE) 37.5-25 MG capsule TAKE 1 CAPSULE BY MOUTH DAILY. 90 capsule 2  . vitamin B-12 (CYANOCOBALAMIN) 1000 MCG tablet Take 1,000 mcg by mouth daily.    . vitamin E 400 UNIT capsule Take 400 Units by mouth daily.     No current facility-administered medications for this visit.    Allergies  Allergen Reactions  . Adhesive [Tape] Rash    redness    Family History  Problem Relation Age of Onset  . Cancer Mother     lung  . Hypertension Sister     Social History   Social History  . Marital Status: Widowed    Spouse Name: N/A  . Number of Children: N/A  . Years of Education: N/A   Occupational History  . Not on file.   Social History Main Topics  . Smoking status: Never Smoker   . Smokeless tobacco: Never Used  . Alcohol Use: No  . Drug Use: No  . Sexual Activity: Not on file   Other Topics Concern  . Not on file  Social History Narrative   ** Merged History Encounter **         Constitutional: Denies fever, malaise, fatigue, headache or abrupt weight changes.  HEENT: Denies eye pain, eye redness, ear pain, ringing in the ears, wax buildup, runny nose, nasal congestion, bloody nose, or sore throat. Respiratory: Denies difficulty breathing, shortness of breath, cough or sputum production.   Cardiovascular: Denies chest pain, chest tightness, palpitations or swelling in the hands or feet.  Neurological: Denies difficulty with memory, difficulty with speech or problems with balance and coordination.  Psych: Pt reports anxiety. Denies depression, SI/HI.  No other specific complaints in a complete review of systems (except as listed in HPI above).  Objective:   Physical Exam  BP 140/80 mmHg  Pulse 66  Temp(Src) 97.9 F (36.6 C) (Oral)  Wt 160 lb (72.576 kg)  SpO2 98% Wt Readings from  Last 3 Encounters:  03/12/16 160 lb (72.576 kg)  01/27/16 160 lb (72.576 kg)  07/25/15 157 lb (71.215 kg)    General: Appears her stated age, well developed, well nourished in NAD. HEENT: Head: normal shape and size; Eyes: sclera white, no icterus, conjunctiva pink, PERRLA and EOMs intact; Cardiovascular: Normal rate and rhythm. S1,S2 noted.  No murmur, rubs or gallops noted.  Pulmonary/Chest: Normal effort and positive vesicular breath sounds. No respiratory distress. No wheezes, rales or ronchi noted.  Neurological: Cranial nerves II-XII grossly intact.    BMET    Component Value Date/Time   NA 131* 01/27/2016 0908   NA 130* 04/12/2015 0501   K 4.8 01/27/2016 0908   K 4.3 04/12/2015 0501   CL 96 01/27/2016 0908   CL 98* 04/12/2015 0501   CO2 28 01/27/2016 0908   CO2 25 04/12/2015 0501   GLUCOSE 96 01/27/2016 0908   GLUCOSE 102* 04/12/2015 0501   BUN 14 01/27/2016 0908   BUN 14 04/12/2015 0501   CREATININE 0.87 01/27/2016 0908   CREATININE 0.79 04/12/2015 0501   CALCIUM 9.8 01/27/2016 0908   CALCIUM 8.0* 04/12/2015 0501   GFRNONAA >60 04/12/2015 0501   GFRAA >60 04/12/2015 0501    Lipid Panel     Component Value Date/Time   CHOL 207* 01/27/2016 0908   TRIG 136.0 01/27/2016 0908   HDL 54.10 01/27/2016 0908   CHOLHDL 4 01/27/2016 0908   VLDL 27.2 01/27/2016 0908   LDLCALC 126* 01/27/2016 0908    CBC    Component Value Date/Time   WBC 6.1 07/22/2015 0854   WBC 7.3 03/27/2015 1437   RBC 4.00 07/22/2015 0854   RBC 3.86 03/27/2015 1437   HGB 12.7 07/22/2015 0854   HGB 9.8* 04/13/2015 0444   HCT 38.0 07/22/2015 0854   HCT 37.5 03/27/2015 1437   PLT 170.0 07/22/2015 0854   PLT 178 04/12/2015 0501   MCV 95.0 07/22/2015 0854   MCV 97 03/27/2015 1437   MCH 32.8 03/27/2015 1437   MCHC 33.5 07/22/2015 0854   MCHC 33.8 03/27/2015 1437   RDW 13.8 07/22/2015 0854   RDW 12.7 03/27/2015 1437    Hgb A1C No results found for: HGBA1C       Assessment & Plan:    Dizziness, jitteriness:  Sounds like it could be related to low blood sugar Advised her to drink plenty of fluids and eat on a regular basis She should also have a snack with her at all times Will check A1C today Anxiety seems controlled with prn Ativan, consider concurrent SSRI if deteriorates  Will follow up after  labs, RTC as needed or if symptoms persist or worsen BAITY, REGINA, NP

## 2016-03-12 NOTE — Patient Instructions (Signed)

## 2016-03-13 ENCOUNTER — Encounter: Payer: Self-pay | Admitting: Internal Medicine

## 2016-03-13 ENCOUNTER — Telehealth: Payer: Self-pay | Admitting: Internal Medicine

## 2016-03-13 NOTE — Telephone Encounter (Signed)
Pt is aware as instructed 

## 2016-03-13 NOTE — Telephone Encounter (Signed)
Result was released via mychart. A1C 5.8 %, she does not have diabetes.

## 2016-03-13 NOTE — Telephone Encounter (Signed)
Patient called to get the results of her lab work.

## 2016-04-16 ENCOUNTER — Other Ambulatory Visit: Payer: Self-pay | Admitting: Internal Medicine

## 2016-04-16 NOTE — Telephone Encounter (Signed)
Last filled 02/20/2016--please advise

## 2016-04-16 NOTE — Telephone Encounter (Signed)
Rx called in to pharmacy. 

## 2016-04-16 NOTE — Telephone Encounter (Signed)
Ok to phone in Ativan 

## 2016-06-03 ENCOUNTER — Other Ambulatory Visit: Payer: Self-pay | Admitting: Internal Medicine

## 2016-06-26 ENCOUNTER — Other Ambulatory Visit: Payer: Self-pay | Admitting: Internal Medicine

## 2016-06-26 NOTE — Telephone Encounter (Signed)
Last filled 04/16/2016--please advise

## 2016-06-29 NOTE — Telephone Encounter (Signed)
Ok to phone in Ativan 

## 2016-06-29 NOTE — Telephone Encounter (Signed)
Rx called in to pharmacy. 

## 2016-07-08 NOTE — Telephone Encounter (Signed)
Pt called to ck on status of lorazepam; spoke with [facility] and will get rx ready for pick up. Pt notified and voiced understanding.

## 2016-07-27 ENCOUNTER — Encounter: Payer: Self-pay | Admitting: Internal Medicine

## 2016-07-27 ENCOUNTER — Ambulatory Visit (INDEPENDENT_AMBULATORY_CARE_PROVIDER_SITE_OTHER): Payer: Medicare Other | Admitting: Internal Medicine

## 2016-07-27 VITALS — BP 144/86 | HR 78 | Temp 98.0°F | Ht 60.75 in | Wt 165.0 lb

## 2016-07-27 DIAGNOSIS — F411 Generalized anxiety disorder: Secondary | ICD-10-CM

## 2016-07-27 DIAGNOSIS — Z0000 Encounter for general adult medical examination without abnormal findings: Secondary | ICD-10-CM

## 2016-07-27 DIAGNOSIS — E559 Vitamin D deficiency, unspecified: Secondary | ICD-10-CM

## 2016-07-27 DIAGNOSIS — E669 Obesity, unspecified: Secondary | ICD-10-CM

## 2016-07-27 DIAGNOSIS — Z1159 Encounter for screening for other viral diseases: Secondary | ICD-10-CM | POA: Diagnosis not present

## 2016-07-27 DIAGNOSIS — K219 Gastro-esophageal reflux disease without esophagitis: Secondary | ICD-10-CM | POA: Diagnosis not present

## 2016-07-27 DIAGNOSIS — I10 Essential (primary) hypertension: Secondary | ICD-10-CM | POA: Diagnosis not present

## 2016-07-27 DIAGNOSIS — E785 Hyperlipidemia, unspecified: Secondary | ICD-10-CM | POA: Diagnosis not present

## 2016-07-27 DIAGNOSIS — Z96652 Presence of left artificial knee joint: Secondary | ICD-10-CM

## 2016-07-27 LAB — COMPREHENSIVE METABOLIC PANEL
ALBUMIN: 4.4 g/dL (ref 3.5–5.2)
ALK PHOS: 58 U/L (ref 39–117)
ALT: 19 U/L (ref 0–35)
AST: 19 U/L (ref 0–37)
BUN: 16 mg/dL (ref 6–23)
CALCIUM: 9.6 mg/dL (ref 8.4–10.5)
CO2: 28 mEq/L (ref 19–32)
Chloride: 99 mEq/L (ref 96–112)
Creatinine, Ser: 0.94 mg/dL (ref 0.40–1.20)
GFR: 62.48 mL/min (ref >60.00)
GLUCOSE: 95 mg/dL (ref 70–99)
POTASSIUM: 4.5 meq/L (ref 3.5–5.1)
Sodium: 135 mEq/L (ref 135–145)
TOTAL PROTEIN: 7.7 g/dL (ref 6.0–8.3)
Total Bilirubin: 0.4 mg/dL (ref 0.2–1.2)

## 2016-07-27 LAB — VITAMIN D 25 HYDROXY (VIT D DEFICIENCY, FRACTURES): VITD: 31.18 ng/mL (ref 30.00–100.00)

## 2016-07-27 LAB — LDL CHOLESTEROL, DIRECT: LDL DIRECT: 152 mg/dL

## 2016-07-27 LAB — CBC
HCT: 36.7 % (ref 36.0–46.0)
HEMOGLOBIN: 12.7 g/dL (ref 12.0–15.0)
MCHC: 34.5 g/dL (ref 30.0–36.0)
MCV: 95.7 fl (ref 78.0–100.0)
PLATELETS: 191 10*3/uL (ref 150.0–400.0)
RBC: 3.84 Mil/uL — AB (ref 3.87–5.11)
RDW: 12.9 % (ref 11.5–15.5)
WBC: 6.4 10*3/uL (ref 4.0–10.5)

## 2016-07-27 LAB — LIPID PANEL
CHOL/HDL RATIO: 5
Cholesterol: 242 mg/dL — ABNORMAL HIGH (ref 0–200)
HDL: 53.1 mg/dL (ref >39.00)
NONHDL: 189.05
TRIGLYCERIDES: 249 mg/dL — AB (ref 0.0–149.0)
VLDL: 49.8 mg/dL — AB (ref 0.0–40.0)

## 2016-07-27 NOTE — Progress Notes (Signed)
Pre visit review using our clinic review tool, if applicable. No additional management support is needed unless otherwise documented below in the visit note.    Flu--09/2015.Marland KitchenMarland KitchenMarland KitchenZostavax--06/2014.Marland KitchenMarland KitchenPrevnar--06/2014.Marland KitchenMarland KitchenPneumo--07/2015.Marland KitchenMarland KitchenTD--12/2013.Marland KitchenMarland KitchenColon--never... Mamm--02/2014 .Marland KitchenMarland KitchenVision--yearly--[facility].Marland KitchenMarland Kitchen

## 2016-07-27 NOTE — Assessment & Plan Note (Signed)
Encouraged her to consume a low fat, low carb diet and work on exercise

## 2016-07-27 NOTE — Assessment & Plan Note (Signed)
Well controlled on Zocor Encouraged her to consume a low fat diet CMET and Lipid Profile today

## 2016-07-27 NOTE — Assessment & Plan Note (Signed)
Will check Vit D level today Continue supplement unless directed otherwise

## 2016-07-27 NOTE — Assessment & Plan Note (Signed)
Controlled on Triamterene HCTZ Encouraged her to take her medication every morning before her appt CMET today

## 2016-07-27 NOTE — Assessment & Plan Note (Signed)
Continue Prilosec BID Encouraged weight loss

## 2016-07-27 NOTE — Progress Notes (Addendum)
HPI:  Pt presents to the clinic today for her Medicare Wellness Exam. She is also due for 6 month follow up of chronic conditions.  GAD: She is taking 1/2 tab of Ativan as needed during the day and 1 tab at night to help her sleep.  GERD: She is taking Prilosec BID.  Symptoms well controlled, denies breakthrough symptoms.  HLD: LDL 126, Triglycerides 136. Denies myalgias on Zocor. Taking Fish Oil BID daily as well. Tries to consume a low fat diet.  HTN: BP well controlled on Triamterene-HCTZ and Atenolol. BP today 144/86.  She reports she did not take her BP medications this morning. There is no ECG on file.   Obesity: Has gained 5 lbs in the last 5 months. BMI is 31.43. She uses a stationary bike and does leg exercises about 4 days a week for 30 minutes.  Vit D deficiency: She takes a Vit D supplement daily. No recent falls.  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 Her mobility is much improved, and she denies pain. She see Dr. Rudene Christians as needed.  Past Medical History:  Diagnosis Date  . Anxiety   . Hyperlipidemia   . Hypertension     Current Outpatient Prescriptions  Medication Sig Dispense Refill  . acetaminophen (TYLENOL) 500 MG tablet Take 1-2 tablets (500-1,000 mg total) by mouth every 4 (four) hours as needed for fever. 30 tablet 0  . aspirin 81 MG tablet Take 81 mg by mouth daily.    Marland Kitchen atenolol (TENORMIN) 25 MG tablet TAKE 1 TABLET (25 MG TOTAL) BY MOUTH DAILY. 30 tablet 5  . calcium carbonate (OS-CAL) 600 MG TABS tablet Take 600 mg by mouth daily.    . Cholecalciferol (VITAMIN D) 2000 UNITS CAPS Take 1 capsule by mouth daily.    Marland Kitchen CRANBERRY PO Take 1 capsule by mouth daily. 4200mg     . ibuprofen (ADVIL,MOTRIN) 200 MG tablet Take 200 mg by mouth as needed.    Marland Kitchen LORazepam (ATIVAN) 0.5 MG tablet TAKE 1 TABLET TWICE A DAY AS NEEDED 45 tablet 0  . Omega-3 Fatty Acids (FISH OIL) 1200 MG CAPS Take 1 capsule by mouth 2 (two) times daily.    Marland Kitchen omeprazole (PRILOSEC) 20 MG  capsule Take 1 capsule (20 mg total) by mouth 2 (two) times daily. 60 capsule 5  . simvastatin (ZOCOR) 20 MG tablet TAKE 1 TABLET BY MOUTH EVERY DAY AT 6PM 30 tablet 5  . triamterene-hydrochlorothiazide (DYAZIDE) 37.5-25 MG capsule TAKE 1 CAPSULE BY MOUTH DAILY. 90 capsule 0  . vitamin B-12 (CYANOCOBALAMIN) 1000 MCG tablet Take 1,000 mcg by mouth daily.    . vitamin E 400 UNIT capsule Take 400 Units by mouth daily.     No current facility-administered medications for this visit.     Allergies  Allergen Reactions  . Adhesive [Tape] Rash    redness    Family History  Problem Relation Age of Onset  . Cancer Mother     lung  . Hypertension Sister     Social History   Social History  . Marital status: Widowed    Spouse name: N/A  . Number of children: N/A  . Years of education: N/A   Occupational History  . Not on file.   Social History Main Topics  . Smoking status: Never Smoker  . Smokeless tobacco: Never Used  . Alcohol use No  . Drug use: No  . Sexual activity: Not on file   Other Topics Concern  .  Not on file   Social History Narrative   ** Merged History Encounter **        Hospitiliaztions:    Health Maintenance:    Flu: 09/2015  Tetanus: 12/2013  Pneumovax: 07/2015  Prevnar: 06/2014  Zostavax:  06/2014  Bone Density: 02/2014  Colonoscopy: never  Eye Doctor: yearly  Dental Exam: as needed, she has dentures  Mammogram: 02/2014  Pap: unsure, she has had a total hysterectomy   Providers:   PCP: [name], NP-C  Orthopedic: Dr. Rudene Christians  Dermatologist: Dr. Delice Lesch  I have personally reviewed and have noted:  1. The patient's medical and social history 2. Their use of alcohol, tobacco or illicit drugs 3. Their current medications and supplements 4. The patient's functional ability including ADL's, fall risks, home safety  risks and hearing or visual impairment. 5. Diet and physical activities 6. Evidence for depression or mood disorder  Subjective:    Review of Systems:   Constitutional: Denies fever, malaise, fatigue, headache or abrupt weight changes.  HEENT: Denies eye pain, eye redness, ear pain, ringing in the ears, wax buildup, runny nose, nasal congestion, bloody nose, or sore throat. Respiratory: Denies difficulty breathing, shortness of breath, cough or sputum production.   Cardiovascular: Denies chest pain, chest tightness, palpitations or swelling in the hands or feet.  Gastrointestinal: Denies abdominal pain, bloating, constipation, diarrhea or blood in the stool.  GU: Denies urgency, frequency, pain with urination, burning sensation, blood in urine, odor or discharge. Musculoskeletal: Denies decrease in range of motion, difficulty with gait, muscle pain or joint pain or swelling.  Skin: Denies redness, rashes, lesions or ulcercations.  Neurological: Denies dizziness, difficulty with memory, difficulty with speech or problems with balance and coordination.  Psych: Pt reports history of anxiety. Denies  depression, SI/HI.  No other specific complaints in a complete review of systems (except as listed in HPI above).  Objective:  PE:   BP (!) 144/86   Pulse 78   Temp 98 F (36.7 C) (Oral)   Ht 5' 0.75" (1.543 m)   Wt 165 lb (74.8 kg)   SpO2 98%   BMI 31.43 kg/m   Wt Readings from Last 3 Encounters:  03/12/16 160 lb (72.6 kg)  01/27/16 160 lb (72.6 kg)  07/25/15 157 lb (71.2 kg)    General: Appears her stated age, in NAD. Skin: Warm, dry and intact. No rashes, lesions or ulcerations noted. HEENT: Head: normal shape and size; Eyes: sclera white, no icterus, conjunctiva pink, PERRLA and EOMs intact;  Cardiovascular: Normal rate and rhythm. S1,S2 noted.  No murmur, rubs or gallops noted. No JVD or BLE edema. No carotid bruits noted. Pulmonary/Chest: Normal effort and positive vesicular breath sounds. No respiratory distress. No wheezes, rales or ronchi noted.  Musculoskeletal: Strength 5/5 BUE/BLE. No signs of  joint swelling. No difficulty with gait.  Neurological: Alert and oriented.  Psychiatric: Mood and affect normal. Behavior is normal. Judgment and thought content normal.     BMET    Component Value Date/Time   NA 131 (L) 01/27/2016 0908   NA 130 (L) 04/12/2015 0501   K 4.8 01/27/2016 0908   K 4.3 04/12/2015 0501   CL 96 01/27/2016 0908   CL 98 (L) 04/12/2015 0501   CO2 28 01/27/2016 0908   CO2 25 04/12/2015 0501   GLUCOSE 96 01/27/2016 0908   GLUCOSE 102 (H) 04/12/2015 0501   BUN 14 01/27/2016 0908   BUN 14 04/12/2015 0501   CREATININE 0.87 01/27/2016 0908  CREATININE 0.79 04/12/2015 0501   CALCIUM 9.8 01/27/2016 0908   CALCIUM 8.0 (L) 04/12/2015 0501   GFRNONAA >60 04/12/2015 0501   GFRAA >60 04/12/2015 0501    Lipid Panel     Component Value Date/Time   CHOL 207 (H) 01/27/2016 0908   TRIG 136.0 01/27/2016 0908   HDL 54.10 01/27/2016 0908   CHOLHDL 4 01/27/2016 0908   VLDL 27.2 01/27/2016 0908   LDLCALC 126 (H) 01/27/2016 0908    CBC    Component Value Date/Time   WBC 6.1 07/22/2015 0854   RBC 4.00 07/22/2015 0854   HGB 12.7 07/22/2015 0854   HGB 9.8 (L) 04/13/2015 0444   HCT 38.0 07/22/2015 0854   HCT 37.5 03/27/2015 1437   PLT 170.0 07/22/2015 0854   PLT 178 04/12/2015 0501   MCV 95.0 07/22/2015 0854   MCV 97 03/27/2015 1437   MCH 32.8 03/27/2015 1437   MCHC 33.5 07/22/2015 0854   RDW 13.8 07/22/2015 0854   RDW 12.7 03/27/2015 1437    Hgb A1C Lab Results  Component Value Date   HGBA1C 5.8 03/12/2016      Assessment and Plan:   Medicare Annual Wellness Visit:  Diet: She is trying to consume a heart healthy, low fat diet.  She drinks coffee and water throughout the day. Physical activity: Stationary Bike, Leg lifts, Squats Depression/mood screen: Negative Hearing: Intact to whispered voice Visual acuity: Grossly normal, performs annual eye exam  ADLs: Capable Fall risk: None Home safety: Good Cognitive evaluation: Intact to  orientation, naming, recall and repetition EOL planning: Living Will, full code/ I agree  Preventative Medicine: Flu, pneumovax, prevnar and tetanus UTD. She declines mammogram, pelvic screening. Bone density UTD. She declines colonoscopy, Cologuard or IFOB. Hep C screening with labs today. Encouraged her to see an eye doctor annually.   Next appointment: 1 year, Medicare Wellness exam and follow up

## 2016-07-27 NOTE — Assessment & Plan Note (Signed)
Continue Ibuprofen prn

## 2016-07-27 NOTE — Assessment & Plan Note (Signed)
-   Continue Ativan as needed 

## 2016-07-27 NOTE — Progress Notes (Signed)
HPI:  Pt presents to the clinic today for her Medicare Wellness Exam. She is also due for 6 month follow up of chronic conditions.  GAD: She is taking 1/2 tab of Ativan as needed during the day and 1 tab at night to help her sleep.  GERD: She is taking Prilosec BID.  Symptoms well controlled, denies breakthrough symptoms.  HLD: LDL 126, Triglycerides 136. Denies myalgias on Zocor. Taking Fish Oil BID daily as well. Tries to consume a low fat diet.  HTN: BP well controlled on Triamterene-HCTZ and Atenolol. BP today 144/86.  She reports she did not take her BP medications this morning. There is no ECG on file.   Obesity: Has gained 5 lbs in the last 5 months. BMI is 31.43. She uses a stationary bike and does leg exercises about 4 days a week for 30 minutes.  Vit D deficiency: She takes a Vit D supplement daily. No recent falls.  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 Her mobility is much improved, and she denies pain. She see Dr. Rudene Christians as needed.  Past Medical History:  Diagnosis Date  . Anxiety   . Hyperlipidemia   . Hypertension     Current Outpatient Prescriptions  Medication Sig Dispense Refill  . acetaminophen (TYLENOL) 500 MG tablet Take 1-2 tablets (500-1,000 mg total) by mouth every 4 (four) hours as needed for fever. 30 tablet 0  . aspirin 81 MG tablet Take 81 mg by mouth daily.    Marland Kitchen atenolol (TENORMIN) 25 MG tablet TAKE 1 TABLET (25 MG TOTAL) BY MOUTH DAILY. 30 tablet 5  . calcium carbonate (OS-CAL) 600 MG TABS tablet Take 600 mg by mouth daily.    . Cholecalciferol (VITAMIN D) 2000 UNITS CAPS Take 1 capsule by mouth daily.    Marland Kitchen CRANBERRY PO Take 1 capsule by mouth daily. 4200mg     . ibuprofen (ADVIL,MOTRIN) 200 MG tablet Take 200 mg by mouth as needed.    Marland Kitchen LORazepam (ATIVAN) 0.5 MG tablet TAKE 1 TABLET TWICE A DAY AS NEEDED 45 tablet 0  . Omega-3 Fatty Acids (FISH OIL) 1200 MG CAPS Take 1 capsule by mouth 2 (two) times daily.    Marland Kitchen omeprazole (PRILOSEC) 20 MG  capsule Take 1 capsule (20 mg total) by mouth 2 (two) times daily. 60 capsule 5  . simvastatin (ZOCOR) 20 MG tablet TAKE 1 TABLET BY MOUTH EVERY DAY AT 6PM 30 tablet 5  . triamterene-hydrochlorothiazide (DYAZIDE) 37.5-25 MG capsule TAKE 1 CAPSULE BY MOUTH DAILY. 90 capsule 0  . vitamin B-12 (CYANOCOBALAMIN) 1000 MCG tablet Take 1,000 mcg by mouth daily.    . vitamin E 400 UNIT capsule Take 400 Units by mouth daily.     No current facility-administered medications for this visit.     Allergies  Allergen Reactions  . Adhesive [Tape] Rash    redness    Family History  Problem Relation Age of Onset  . Cancer Mother     lung  . Hypertension Sister     Social History   Social History  . Marital status: Widowed    Spouse name: N/A  . Number of children: N/A  . Years of education: N/A   Occupational History  . Not on file.   Social History Main Topics  . Smoking status: Never Smoker  . Smokeless tobacco: Never Used  . Alcohol use No  . Drug use: No  . Sexual activity: Not on file   Other Topics Concern  .  Not on file   Social History Narrative   ** Merged History Encounter **        Hospitiliaztions:    Health Maintenance:    Flu: 09/2015  Tetanus: 12/2013  Pneumovax: 07/2015  Prevnar: 06/2014  Zostavax:  06/2014  Bone Density: 02/2014  Colonoscopy: never  Eye Doctor: yearly  Dental Exam: as needed, she has dentures  Mammogram: 02/2014  Pap: unsure, she has had a total hysterectomy   Providers:   PCP: [name], NP-C  Orthopedic: Dr. Rudene Christians  Dermatologist: Dr. Delice Lesch  I have personally reviewed and have noted:  1. The patient's medical and social history 2. Their use of alcohol, tobacco or illicit drugs 3. Their current medications and supplements 4. The patient's functional ability including ADL's, fall risks, home safety  risks and hearing or visual impairment. 5. Diet and physical activities 6. Evidence for depression or mood disorder  Subjective:    Review of Systems:   Constitutional: Denies fever, malaise, fatigue, headache or abrupt weight changes.  HEENT: Denies eye pain, eye redness, ear pain, ringing in the ears, wax buildup, runny nose, nasal congestion, bloody nose, or sore throat. Respiratory: Denies difficulty breathing, shortness of breath, cough or sputum production.   Cardiovascular: Denies chest pain, chest tightness, palpitations or swelling in the hands or feet.  Gastrointestinal: Denies abdominal pain, bloating, constipation, diarrhea or blood in the stool.  GU: Denies urgency, frequency, pain with urination, burning sensation, blood in urine, odor or discharge. Musculoskeletal: Denies decrease in range of motion, difficulty with gait, muscle pain or joint pain or swelling.  Skin: Denies redness, rashes, lesions or ulcercations.  Neurological: Denies dizziness, difficulty with memory, difficulty with speech or problems with balance and coordination.  Psych: Pt reports history of anxiety. Denies  depression, SI/HI.  No other specific complaints in a complete review of systems (except as listed in HPI above).  Objective:  PE:   BP (!) 144/86   Pulse 78   Temp 98 F (36.7 C) (Oral)   Ht 5' 0.75" (1.543 m)   Wt 165 lb (74.8 kg)   SpO2 98%   BMI 31.43 kg/m   Wt Readings from Last 3 Encounters:  03/12/16 160 lb (72.6 kg)  01/27/16 160 lb (72.6 kg)  07/25/15 157 lb (71.2 kg)    General: Appears her stated age, in NAD. Skin: Warm, dry and intact. No rashes, lesions or ulcerations noted. HEENT: Head: normal shape and size; Eyes: sclera white, no icterus, conjunctiva pink, PERRLA and EOMs intact;  Cardiovascular: Normal rate and rhythm. S1,S2 noted.  No murmur, rubs or gallops noted. No JVD or BLE edema. No carotid bruits noted. Pulmonary/Chest: Normal effort and positive vesicular breath sounds. No respiratory distress. No wheezes, rales or ronchi noted.  Musculoskeletal: Strength 5/5 BUE/BLE. No signs of  joint swelling. No difficulty with gait.  Neurological: Alert and oriented.  Psychiatric: Mood and affect normal. Behavior is normal. Judgment and thought content normal.     BMET    Component Value Date/Time   NA 131 (L) 01/27/2016 0908   NA 130 (L) 04/12/2015 0501   K 4.8 01/27/2016 0908   K 4.3 04/12/2015 0501   CL 96 01/27/2016 0908   CL 98 (L) 04/12/2015 0501   CO2 28 01/27/2016 0908   CO2 25 04/12/2015 0501   GLUCOSE 96 01/27/2016 0908   GLUCOSE 102 (H) 04/12/2015 0501   BUN 14 01/27/2016 0908   BUN 14 04/12/2015 0501   CREATININE 0.87 01/27/2016 0908  CREATININE 0.79 04/12/2015 0501   CALCIUM 9.8 01/27/2016 0908   CALCIUM 8.0 (L) 04/12/2015 0501   GFRNONAA >60 04/12/2015 0501   GFRAA >60 04/12/2015 0501    Lipid Panel     Component Value Date/Time   CHOL 207 (H) 01/27/2016 0908   TRIG 136.0 01/27/2016 0908   HDL 54.10 01/27/2016 0908   CHOLHDL 4 01/27/2016 0908   VLDL 27.2 01/27/2016 0908   LDLCALC 126 (H) 01/27/2016 0908    CBC    Component Value Date/Time   WBC 6.1 07/22/2015 0854   RBC 4.00 07/22/2015 0854   HGB 12.7 07/22/2015 0854   HGB 9.8 (L) 04/13/2015 0444   HCT 38.0 07/22/2015 0854   HCT 37.5 03/27/2015 1437   PLT 170.0 07/22/2015 0854   PLT 178 04/12/2015 0501   MCV 95.0 07/22/2015 0854   MCV 97 03/27/2015 1437   MCH 32.8 03/27/2015 1437   MCHC 33.5 07/22/2015 0854   RDW 13.8 07/22/2015 0854   RDW 12.7 03/27/2015 1437    Hgb A1C Lab Results  Component Value Date   HGBA1C 5.8 03/12/2016      Assessment and Plan:   Medicare Annual Wellness Visit:  Diet: She is trying to consume a heart healthy, low fat diet.  She drinks coffee and water throughout the day. Physical activity: Stationary Bike, Leg lifts, Squats Depression/mood screen: Negative Hearing: Intact to whispered voice Visual acuity: Grossly normal, performs annual eye exam  ADLs: Capable Fall risk: None Home safety: Good Cognitive evaluation: Intact to  orientation, naming, recall and repetition EOL planning: Living Will, full code/ I agree  Preventative Medicine: Flu, pneumovax, prevnar and tetanus UTD. She declines mammogram, pelvic screening. Bone density UTD. She declines colonoscopy, Cologuard or IFOB. Hep C screening with labs today. Encouraged her to see an eye doctor annually.   Next appointment: 6 months, follow up chronic condtions

## 2016-07-27 NOTE — Patient Instructions (Signed)

## 2016-07-28 LAB — HEPATITIS C ANTIBODY: HCV AB: NEGATIVE

## 2016-08-03 ENCOUNTER — Telehealth: Payer: Self-pay | Admitting: Internal Medicine

## 2016-08-03 NOTE — Telephone Encounter (Signed)
Pt returned your call, she saw a missed call thanks

## 2016-08-14 ENCOUNTER — Other Ambulatory Visit: Payer: Self-pay | Admitting: Internal Medicine

## 2016-08-21 ENCOUNTER — Other Ambulatory Visit: Payer: Self-pay | Admitting: Internal Medicine

## 2016-09-09 DIAGNOSIS — Z23 Encounter for immunization: Secondary | ICD-10-CM | POA: Diagnosis not present

## 2016-09-10 ENCOUNTER — Other Ambulatory Visit: Payer: Self-pay | Admitting: Internal Medicine

## 2016-09-13 ENCOUNTER — Other Ambulatory Visit: Payer: Self-pay | Admitting: Internal Medicine

## 2016-10-21 DIAGNOSIS — H81399 Other peripheral vertigo, unspecified ear: Secondary | ICD-10-CM | POA: Diagnosis not present

## 2016-10-21 DIAGNOSIS — J019 Acute sinusitis, unspecified: Secondary | ICD-10-CM | POA: Diagnosis not present

## 2016-11-23 ENCOUNTER — Other Ambulatory Visit: Payer: Self-pay | Admitting: Internal Medicine

## 2016-11-23 NOTE — Telephone Encounter (Signed)
Ok to phone in Ativan 

## 2016-11-23 NOTE — Telephone Encounter (Signed)
Last filled 06/29/16--please advise

## 2016-11-23 NOTE — Telephone Encounter (Signed)
Rx called in to pharmacy. 

## 2016-12-21 DIAGNOSIS — L814 Other melanin hyperpigmentation: Secondary | ICD-10-CM | POA: Diagnosis not present

## 2016-12-21 DIAGNOSIS — D1801 Hemangioma of skin and subcutaneous tissue: Secondary | ICD-10-CM | POA: Diagnosis not present

## 2016-12-21 DIAGNOSIS — Z85828 Personal history of other malignant neoplasm of skin: Secondary | ICD-10-CM | POA: Diagnosis not present

## 2017-01-11 ENCOUNTER — Telehealth: Payer: Self-pay | Admitting: Internal Medicine

## 2017-01-11 ENCOUNTER — Encounter: Payer: Self-pay | Admitting: Internal Medicine

## 2017-01-11 NOTE — Telephone Encounter (Signed)
Pt dropped off jury summons for 2/13. She is requesting a waiver based on her anxiety, bp issues and back pain that she deals with. I placed in Rx tower.  Also, she would like a cb concerning her pneumonia vaccinations. She wants to make sure she is up to date.

## 2017-01-11 NOTE — Telephone Encounter (Signed)
She is UTD on her pneumonia vaccines.   Her anxiety and blood pressure have been controlled with medication and she has not mentioned back pain to me. She does not have a condition that would excuse her from jury duty.

## 2017-01-13 NOTE — Telephone Encounter (Signed)
Pt called to check status of jury summons. I read her the response and she understood. She will be coming in to pick up the official summons she dropped off.

## 2017-01-13 NOTE — Telephone Encounter (Signed)
Placed in front office for pickup 

## 2017-02-19 ENCOUNTER — Other Ambulatory Visit: Payer: Self-pay | Admitting: Internal Medicine

## 2017-03-13 IMAGING — CR DG KNEE 1-2V*L*
1 series · 2 of 2 positions shown · non-contrast
Comparison: 02/20/2015

CLINICAL DATA: Knee replacement

EXAM:
LEFT KNEE - 1-2 VIEW

[Series 1: ap · 0.17mm/px · 2 of 2 slices shown]
[im 1/2]
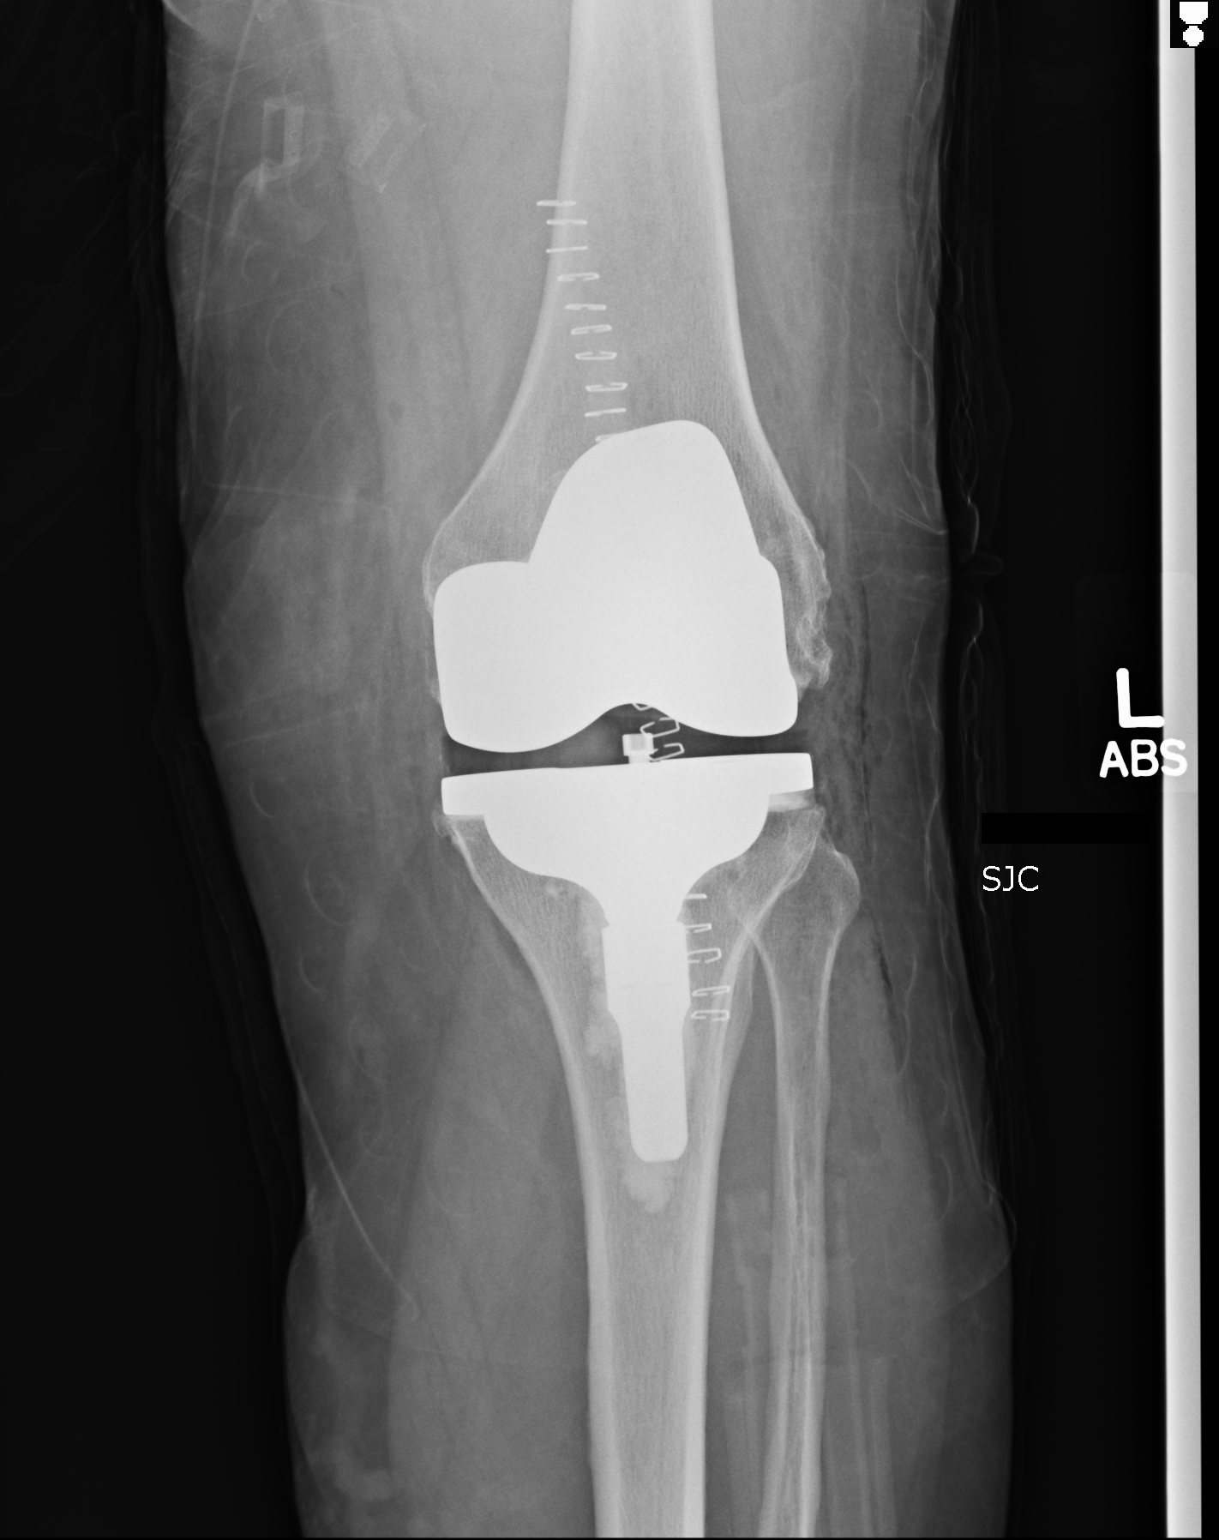
[im 2/2]
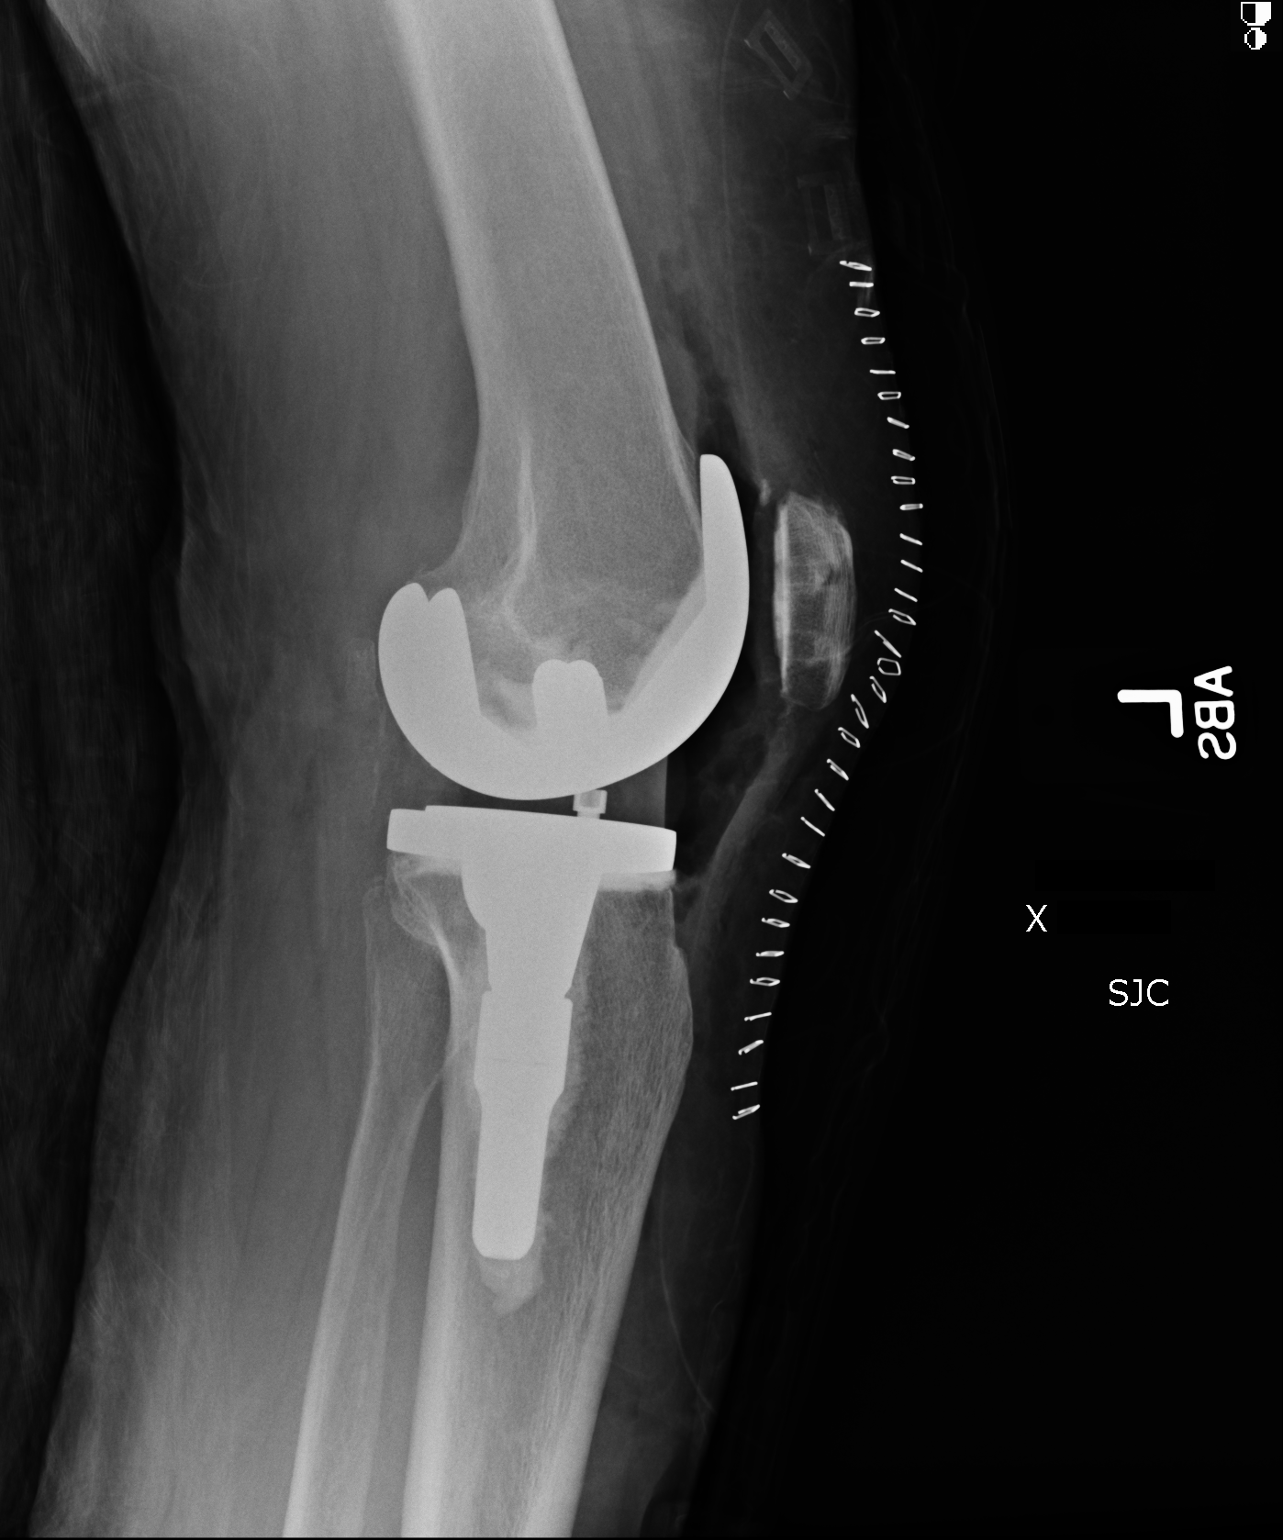

[2 of 2 positions shown; findings below may reference images not displayed]

FINDINGS: Total knee arthroplasty has been performed. Anatomic alignment of
the osseous and prostatic structures. Gas in the knee joint. No
breakage or loosening of the hardware.
IMPRESSION: Total knee arthroplasty anatomically aligned.

## 2017-07-03 ENCOUNTER — Other Ambulatory Visit: Payer: Self-pay | Admitting: Internal Medicine

## 2017-07-29 ENCOUNTER — Encounter: Payer: Self-pay | Admitting: Internal Medicine

## 2017-07-29 ENCOUNTER — Ambulatory Visit (INDEPENDENT_AMBULATORY_CARE_PROVIDER_SITE_OTHER): Payer: Medicare Other | Admitting: Internal Medicine

## 2017-07-29 VITALS — BP 138/84 | HR 88 | Temp 98.0°F | Ht 60.5 in | Wt 165.0 lb

## 2017-07-29 DIAGNOSIS — F411 Generalized anxiety disorder: Secondary | ICD-10-CM

## 2017-07-29 DIAGNOSIS — E7800 Pure hypercholesterolemia, unspecified: Secondary | ICD-10-CM

## 2017-07-29 DIAGNOSIS — K219 Gastro-esophageal reflux disease without esophagitis: Secondary | ICD-10-CM

## 2017-07-29 DIAGNOSIS — E559 Vitamin D deficiency, unspecified: Secondary | ICD-10-CM

## 2017-07-29 DIAGNOSIS — Z1239 Encounter for other screening for malignant neoplasm of breast: Secondary | ICD-10-CM

## 2017-07-29 DIAGNOSIS — Z0000 Encounter for general adult medical examination without abnormal findings: Secondary | ICD-10-CM | POA: Diagnosis not present

## 2017-07-29 DIAGNOSIS — I10 Essential (primary) hypertension: Secondary | ICD-10-CM

## 2017-07-29 LAB — COMPREHENSIVE METABOLIC PANEL
ALK PHOS: 54 U/L (ref 39–117)
ALT: 19 U/L (ref 0–35)
AST: 18 U/L (ref 0–37)
Albumin: 3.9 g/dL (ref 3.5–5.2)
BILIRUBIN TOTAL: 0.5 mg/dL (ref 0.2–1.2)
BUN: 17 mg/dL (ref 6–23)
CO2: 29 meq/L (ref 19–32)
CREATININE: 0.86 mg/dL (ref 0.40–1.20)
Calcium: 9.4 mg/dL (ref 8.4–10.5)
Chloride: 96 mEq/L (ref 96–112)
GFR: 69.03 mL/min (ref >60.00)
GLUCOSE: 96 mg/dL (ref 70–99)
POTASSIUM: 4.5 meq/L (ref 3.5–5.1)
SODIUM: 130 meq/L — AB (ref 135–145)
TOTAL PROTEIN: 6.9 g/dL (ref 6.0–8.3)

## 2017-07-29 LAB — CBC
HEMATOCRIT: 37.9 % (ref 36.0–46.0)
Hemoglobin: 12.9 g/dL (ref 12.0–15.0)
MCHC: 34 g/dL (ref 30.0–36.0)
MCV: 98.9 fl (ref 78.0–100.0)
Platelets: 203 10*3/uL (ref 150.0–400.0)
RBC: 3.83 Mil/uL — ABNORMAL LOW (ref 3.87–5.11)
RDW: 12.6 % (ref 11.5–15.5)
WBC: 5.9 10*3/uL (ref 4.0–10.5)

## 2017-07-29 LAB — LIPID PANEL
CHOL/HDL RATIO: 4
Cholesterol: 186 mg/dL (ref 0–200)
HDL: 50.6 mg/dL (ref >39.00)
LDL Cholesterol: 105 mg/dL — ABNORMAL HIGH (ref 0–99)
NONHDL: 135.13
Triglycerides: 151 mg/dL — ABNORMAL HIGH (ref 0.0–149.0)
VLDL: 30.2 mg/dL (ref 0.0–40.0)

## 2017-07-29 LAB — VITAMIN D 25 HYDROXY (VIT D DEFICIENCY, FRACTURES): VITD: 51.05 ng/mL (ref 30.00–100.00)

## 2017-07-29 MED ORDER — ATENOLOL 25 MG PO TABS: ORAL_TABLET | ORAL | 11 refills | Status: DC

## 2017-07-29 MED ORDER — LORAZEPAM 0.5 MG PO TABS: 0.5000 mg | ORAL_TABLET | Freq: Two times a day (BID) | ORAL | 0 refills | Status: DC | PRN

## 2017-07-29 MED ORDER — TRIAMTERENE-HCTZ 37.5-25 MG PO CAPS: 1.0000 | ORAL_CAPSULE | Freq: Every day | ORAL | 11 refills | Status: DC

## 2017-07-29 MED ORDER — OMEPRAZOLE 20 MG PO CPDR: 20.0000 mg | DELAYED_RELEASE_CAPSULE | Freq: Two times a day (BID) | ORAL | 11 refills | Status: DC

## 2017-07-29 NOTE — Assessment & Plan Note (Signed)
Encouraged her to avoid spicy foods Discussed how weight loss might help improve her reflux Continue Prilosec for now, refilled today CMET today

## 2017-07-29 NOTE — Assessment & Plan Note (Signed)
CMET and Lipid profile today Encouraged her to consume a low fat diet Continue Simvastatin for now May need to increase dose if LDL > 130

## 2017-07-29 NOTE — Patient Instructions (Signed)
Health Maintenance for Postmenopausal Women Menopause is a normal process in which your reproductive ability comes to an end. This process happens gradually over a span of months to years, usually between the ages of 22 and 9. Menopause is complete when you have missed 12 consecutive menstrual periods. It is important to talk with your health care provider about some of the most common conditions that affect postmenopausal women, such as heart disease, cancer, and bone loss (osteoporosis). Adopting a healthy lifestyle and getting preventive care can help to promote your health and wellness. Those actions can also lower your chances of developing some of these common conditions. What should I know about menopause? During menopause, you may experience a number of symptoms, such as:  Moderate-to-severe hot flashes.  Night sweats.  Decrease in sex drive.  Mood swings.  Headaches.  Tiredness.  Irritability.  Memory problems.  Insomnia.  Choosing to treat or not to treat menopausal changes is an individual decision that you make with your health care provider. What should I know about hormone replacement therapy and supplements? Hormone therapy products are effective for treating symptoms that are associated with menopause, such as hot flashes and night sweats. Hormone replacement carries certain risks, especially as you become older. If you are thinking about using estrogen or estrogen with progestin treatments, discuss the benefits and risks with your health care provider. What should I know about heart disease and stroke? Heart disease, heart attack, and stroke become more likely as you age. This may be due, in part, to the hormonal changes that your body experiences during menopause. These can affect how your body processes dietary fats, triglycerides, and cholesterol. Heart attack and stroke are both medical emergencies. There are many things that you can do to help prevent heart disease  and stroke:  Have your blood pressure checked at least every 1-2 years. High blood pressure causes heart disease and increases the risk of stroke.  If you are 53-22 years old, ask your health care provider if you should take aspirin to prevent a heart attack or a stroke.  Do not use any tobacco products, including cigarettes, chewing tobacco, or electronic cigarettes. If you need help quitting, ask your health care provider.  It is important to eat a healthy diet and maintain a healthy weight. ? Be sure to include plenty of vegetables, fruits, low-fat dairy products, and lean protein. ? Avoid eating foods that are high in solid fats, added sugars, or salt (sodium).  Get regular exercise. This is one of the most important things that you can do for your health. ? Try to exercise for at least 150 minutes each week. The type of exercise that you do should increase your heart rate and make you sweat. This is known as moderate-intensity exercise. ? Try to do strengthening exercises at least twice each week. Do these in addition to the moderate-intensity exercise.  Know your numbers.Ask your health care provider to check your cholesterol and your blood glucose. Continue to have your blood tested as directed by your health care provider.  What should I know about cancer screening? There are several types of cancer. Take the following steps to reduce your risk and to catch any cancer development as early as possible. Breast Cancer  Practice breast self-awareness. ? This means understanding how your breasts normally appear and feel. ? It also means doing regular breast self-exams. Let your health care provider know about any changes, no matter how small.  If you are 40  or older, have a clinician do a breast exam (clinical breast exam or CBE) every year. Depending on your age, family history, and medical history, it may be recommended that you also have a yearly breast X-ray (mammogram).  If you  have a family history of breast cancer, talk with your health care provider about genetic screening.  If you are at high risk for breast cancer, talk with your health care provider about having an MRI and a mammogram every year.  Breast cancer (BRCA) gene test is recommended for women who have family members with BRCA-related cancers. Results of the assessment will determine the need for genetic counseling and BRCA1 and for BRCA2 testing. BRCA-related cancers include these types: ? Breast. This occurs in males or females. ? Ovarian. ? Tubal. This may also be called fallopian tube cancer. ? Cancer of the abdominal or pelvic lining (peritoneal cancer). ? Prostate. ? Pancreatic.  Cervical, Uterine, and Ovarian Cancer Your health care provider may recommend that you be screened regularly for cancer of the pelvic organs. These include your ovaries, uterus, and vagina. This screening involves a pelvic exam, which includes checking for microscopic changes to the surface of your cervix (Pap test).  For women ages 21-65, health care providers may recommend a pelvic exam and a Pap test every three years. For women ages 79-65, they may recommend the Pap test and pelvic exam, combined with testing for human papilloma virus (HPV), every five years. Some types of HPV increase your risk of cervical cancer. Testing for HPV may also be done on women of any age who have unclear Pap test results.  Other health care providers may not recommend any screening for nonpregnant women who are considered low risk for pelvic cancer and have no symptoms. Ask your health care provider if a screening pelvic exam is right for you.  If you have had past treatment for cervical cancer or a condition that could lead to cancer, you need Pap tests and screening for cancer for at least 20 years after your treatment. If Pap tests have been discontinued for you, your risk factors (such as having a new sexual partner) need to be  reassessed to determine if you should start having screenings again. Some women have medical problems that increase the chance of getting cervical cancer. In these cases, your health care provider may recommend that you have screening and Pap tests more often.  If you have a family history of uterine cancer or ovarian cancer, talk with your health care provider about genetic screening.  If you have vaginal bleeding after reaching menopause, tell your health care provider.  There are currently no reliable tests available to screen for ovarian cancer.  Lung Cancer Lung cancer screening is recommended for adults 69-62 years old who are at high risk for lung cancer because of a history of smoking. A yearly low-dose CT scan of the lungs is recommended if you:  Currently smoke.  Have a history of at least 30 pack-years of smoking and you currently smoke or have quit within the past 15 years. A pack-year is smoking an average of one pack of cigarettes per day for one year.  Yearly screening should:  Continue until it has been 15 years since you quit.  Stop if you develop a health problem that would prevent you from having lung cancer treatment.  Colorectal Cancer  This type of cancer can be detected and can often be prevented.  Routine colorectal cancer screening usually begins at  age 42 and continues through age 45.  If you have risk factors for colon cancer, your health care provider may recommend that you be screened at an earlier age.  If you have a family history of colorectal cancer, talk with your health care provider about genetic screening.  Your health care provider may also recommend using home test kits to check for hidden blood in your stool.  A small camera at the end of a tube can be used to examine your colon directly (sigmoidoscopy or colonoscopy). This is done to check for the earliest forms of colorectal cancer.  Direct examination of the colon should be repeated every  5-10 years until age 71. However, if early forms of precancerous polyps or small growths are found or if you have a family history or genetic risk for colorectal cancer, you may need to be screened more often.  Skin Cancer  Check your skin from head to toe regularly.  Monitor any moles. Be sure to tell your health care provider: ? About any new moles or changes in moles, especially if there is a change in a mole's shape or color. ? If you have a mole that is larger than the size of a pencil eraser.  If any of your family members has a history of skin cancer, especially at a young age, talk with your health care provider about genetic screening.  Always use sunscreen. Apply sunscreen liberally and repeatedly throughout the day.  Whenever you are outside, protect yourself by wearing long sleeves, pants, a wide-brimmed hat, and sunglasses.  What should I know about osteoporosis? Osteoporosis is a condition in which bone destruction happens more quickly than new bone creation. After menopause, you may be at an increased risk for osteoporosis. To help prevent osteoporosis or the bone fractures that can happen because of osteoporosis, the following is recommended:  If you are 46-71 years old, get at least 1,000 mg of calcium and at least 600 mg of vitamin D per day.  If you are older than age 55 but younger than age 65, get at least 1,200 mg of calcium and at least 600 mg of vitamin D per day.  If you are older than age 54, get at least 1,200 mg of calcium and at least 800 mg of vitamin D per day.  Smoking and excessive alcohol intake increase the risk of osteoporosis. Eat foods that are rich in calcium and vitamin D, and do weight-bearing exercises several times each week as directed by your health care provider. What should I know about how menopause affects my mental health? Depression may occur at any age, but it is more common as you become older. Common symptoms of depression  include:  Low or sad mood.  Changes in sleep patterns.  Changes in appetite or eating patterns.  Feeling an overall lack of motivation or enjoyment of activities that you previously enjoyed.  Frequent crying spells.  Talk with your health care provider if you think that you are experiencing depression. What should I know about immunizations? It is important that you get and maintain your immunizations. These include:  Tetanus, diphtheria, and pertussis (Tdap) booster vaccine.  Influenza every year before the flu season begins.  Pneumonia vaccine.  Shingles vaccine.  Your health care provider may also recommend other immunizations. This information is not intended to replace advice given to you by your health care provider. Make sure you discuss any questions you have with your health care provider. Document Released: 01/22/2006  Document Revised: 06/19/2016 Document Reviewed: 09/03/2015 Elsevier Interactive Patient Education  2018 [employer]  

## 2017-07-29 NOTE — Assessment & Plan Note (Signed)
Will check Vit D today She may need to restart her Calcium/Vit D OTC but will await lab results Encouraged her to get 30 minutes of weight bearing activity daily

## 2017-07-29 NOTE — Assessment & Plan Note (Addendum)
Mildly elevated today but she reports she has not had her medication yet Advised her to continue Triamterene HCT and Atenolol, refilled today Encouraged her to consume a low salt diet and exercise for weight loss CMET today

## 2017-07-29 NOTE — Progress Notes (Signed)
HPI:  Pt presents to the clinic today for her Medicare Wellness Exam. She is also due to follow up chronic conditions.   Anxiety: Chronic but stable on Ativan. She takes 1/2 tab daily prn. She has not needed to take the Ativan at night to help her sleep. She is requesting a refill of Ativan today.  HLD: Her last LDL was 152, 07/2016. She has been taking Simvastatin as prescribed but has not been consuming a low fat diet.  HTN: Her BP today is 138/84.  She is taking Triamterene-HCT and Atenolol as prescribed but she reports she has not taken her Triamterene-HCT this morning. She has also been taking Sudafed daily for the last week. There is no ECG on file.  GERD: Triggered by spicy foods. She denies breakthrough symptoms on Prilosec.  Vit D Deficiency: She reports she recently stopped taking her Calcium and Vit D OTC.  Past Medical History:  Diagnosis Date  . Anxiety   . Hyperlipidemia   . Hypertension     Current Outpatient Prescriptions  Medication Sig Dispense Refill  . acetaminophen (TYLENOL) 500 MG tablet Take 1-2 tablets (500-1,000 mg total) by mouth every 4 (four) hours as needed for fever. 30 tablet 0  . aspirin 81 MG tablet Take 81 mg by mouth daily.    Marland Kitchen atenolol (TENORMIN) 25 MG tablet TAKE 1 TABLET (25 MG TOTAL) BY MOUTH DAILY. 30 tablet 10  . calcium carbonate (OS-CAL) 600 MG TABS tablet Take 600 mg by mouth daily.    . Cholecalciferol (VITAMIN D) 2000 UNITS CAPS Take 1 capsule by mouth daily.    Marland Kitchen CRANBERRY PO Take 1 capsule by mouth daily. 4200mg     . ibuprofen (ADVIL,MOTRIN) 200 MG tablet Take 200 mg by mouth as needed.    Marland Kitchen LORazepam (ATIVAN) 0.5 MG tablet TAKE 1 TABLET BY MOUTH TWICE A DAY AS NEEDED 45 tablet 0  . Omega-3 Fatty Acids (FISH OIL) 1200 MG CAPS Take 1 capsule by mouth 2 (two) times daily.    Marland Kitchen omeprazole (PRILOSEC) 20 MG capsule TAKE 1 CAPSULE (20 MG TOTAL) BY MOUTH 2 (TWO) TIMES DAILY. 60 capsule 11  . simvastatin (ZOCOR) 20 MG tablet TAKE 1 TABLET BY  MOUTH EVERY DAY AT 6PM 30 tablet 0  . triamterene-hydrochlorothiazide (DYAZIDE) 37.5-25 MG capsule TAKE 1 CAPSULE BY MOUTH DAILY. 90 capsule 1  . vitamin B-12 (CYANOCOBALAMIN) 1000 MCG tablet Take 1,000 mcg by mouth daily.    . vitamin E 400 UNIT capsule Take 400 Units by mouth daily.     No current facility-administered medications for this visit.     Allergies  Allergen Reactions  . Adhesive [Tape] Rash    redness    Family History  Problem Relation Age of Onset  . Cancer Mother        lung  . Hypertension Sister     Social History   Social History  . Marital status: Widowed    Spouse name: N/A  . Number of children: N/A  . Years of education: N/A   Occupational History  . Not on file.   Social History Main Topics  . Smoking status: Never Smoker  . Smokeless tobacco: Never Used  . Alcohol use No  . Drug use: No  . Sexual activity: No   Other Topics Concern  . Not on file   Social History Narrative   ** Merged History Encounter **        Hospitiliaztions: None  Health Maintenance:  Flu: 08/2016  Tetanus: 12/2013  Pneumovax: 07/2015  Prevnar: 06/2014  Zostavax: 06/2014  Mammogram: 02/2014  Pap Smear: hysterectomy  Bone Density: 02/2014  Colon Screening: never  Eye Doctor: as needed  Dental Exam: annually   Providers:   PCP: [name], NP-C  Orthopedist: Dr. Rudene Christians   I have personally reviewed and have noted:  1. The patient's medical and social history 2. Their use of alcohol, tobacco or illicit drugs 3. Their current medications and supplements 4. The patient's functional ability including ADL's, fall risks, home safety risks and hearing or visual impairment. 5. Diet and physical activities 6. Evidence for depression or mood disorder  Subjective:   Review of Systems:   Constitutional: Denies fever, malaise, fatigue, headache or abrupt weight changes.  HEENT: Denies eye pain, eye redness, ear pain, ringing in the ears, wax buildup, runny  nose, nasal congestion, bloody nose, or sore throat. Respiratory: Denies difficulty breathing, shortness of breath, cough or sputum production.   Cardiovascular: Denies chest pain, chest tightness, palpitations or swelling in the hands or feet.  Gastrointestinal: Denies abdominal pain, bloating, constipation, diarrhea or blood in the stool.  GU: Denies urgency, frequency, pain with urination, burning sensation, blood in urine, odor or discharge. Musculoskeletal: Denies decrease in range of motion, difficulty with gait, muscle pain or joint pain and swelling.  Skin: Denies redness, rashes, lesions or ulcercations.  Neurological: Denies dizziness, difficulty with memory, difficulty with speech or problems with balance and coordination.  Psych: Pt reports intermittent anxiety. Denies depression, SI/HI.  No other specific complaints in a complete review of systems (except as listed in HPI above).  Objective:  PE:   BP 138/84   Pulse 88   Temp 98 F (36.7 C) (Oral)   Ht 5' 0.5" (1.537 m)   Wt 165 lb (74.8 kg)   SpO2 96%   BMI 31.69 kg/m   Wt Readings from Last 3 Encounters:  07/27/16 165 lb (74.8 kg)  03/12/16 160 lb (72.6 kg)  01/27/16 160 lb (72.6 kg)    General: Appears her stated age, well developed, well nourished in NAD. Skin: Warm, dry and intact. Skin tags noted on neck. Sun damage noted on upper chest and bilateral arms. HEENT: Head: normal shape and size; Eyes: sclera white, no icterus, conjunctiva pink, PERRLA and EOMs intact; Ears: Tm's gray and intact, normal light reflex; Throat/Mouth: Teeth present, mucosa pink and moist, no exudate, lesions or ulcerations noted.  Neck: Neck supple, trachea midline. No masses, lumps or thyromegaly present.  Cardiovascular: Normal rate and rhythm. S1,S2 noted.  No murmur, rubs or gallops noted. No JVD or BLE edema. No carotid bruits noted. Pulmonary/Chest: Normal effort and positive vesicular breath sounds. No respiratory distress. No  wheezes, rales or ronchi noted.  Abdomen: Soft and nontender. Normal bowel sounds. No distention or masses noted. Liver, spleen and kidneys non palpable. Musculoskeletal:Strength 5/5 BUE/BLE. No signs of joint swelling.  Neurological: Alert and oriented. Cranial nerves II-XII grossly intact. Coordination normal.  Psychiatric: Mood and affect normal. Behavior is normal. Judgment and thought content normal.     BMET    Component Value Date/Time   NA 135 07/27/2016 0857   NA 130 (L) 04/12/2015 0501   K 4.5 07/27/2016 0857   K 4.3 04/12/2015 0501   CL 99 07/27/2016 0857   CL 98 (L) 04/12/2015 0501   CO2 28 07/27/2016 0857   CO2 25 04/12/2015 0501   GLUCOSE 95 07/27/2016 0857   GLUCOSE 102 (H) 04/12/2015 0501  BUN 16 07/27/2016 0857   BUN 14 04/12/2015 0501   CREATININE 0.94 07/27/2016 0857   CREATININE 0.79 04/12/2015 0501   CALCIUM 9.6 07/27/2016 0857   CALCIUM 8.0 (L) 04/12/2015 0501   GFRNONAA >60 04/12/2015 0501   GFRAA >60 04/12/2015 0501    Lipid Panel     Component Value Date/Time   CHOL 242 (H) 07/27/2016 0857   TRIG 249.0 (H) 07/27/2016 0857   HDL 53.10 07/27/2016 0857   CHOLHDL 5 07/27/2016 0857   VLDL 49.8 (H) 07/27/2016 0857   LDLCALC 126 (H) 01/27/2016 0908    CBC    Component Value Date/Time   WBC 6.4 07/27/2016 0857   RBC 3.84 (L) 07/27/2016 0857   HGB 12.7 07/27/2016 0857   HGB 9.8 (L) 04/13/2015 0444   HCT 36.7 07/27/2016 0857   HCT 37.5 03/27/2015 1437   PLT 191.0 07/27/2016 0857   PLT 178 04/12/2015 0501   MCV 95.7 07/27/2016 0857   MCV 97 03/27/2015 1437   MCH 32.8 03/27/2015 1437   MCHC 34.5 07/27/2016 0857   RDW 12.9 07/27/2016 0857   RDW 12.7 03/27/2015 1437    Hgb A1C Lab Results  Component Value Date   HGBA1C 5.8 03/12/2016      Assessment and Plan:   Medicare Annual Wellness Visit:  Diet: She does eat meat. She consumes fruits and veggies daily. She is eat some fried foods. She drinks mostly coffee, water, and Ginger  Ale. Physical activity: None Depression/mood screen: Negative Hearing: Intact to whispered voice Visual acuity: Grossly normal, performs annual eye exam  ADLs: Capable Fall risk: None Home safety: Good Cognitive evaluation: Intact to orientation, naming, recall and repetition EOL planning: Adv directives, full code/ I agree  Preventative Medicine: Encouraged her to get a flu shot in the fall. All other immunizations are UTD. Mammogram ordered, she will [facility] to schedule- number provided. Bone density due 2020. She does not need pap smears. She declines colonoscopy or Cologuard. Encouraged her to consume a balanced diet and exercise regimen. Advised her to see an eye doctor and dentist annually. Will check CBC, CMET, Lipid and Vit D today.   Next appointment: 1 year, Medicare Wellness Exam   [name], NP

## 2017-07-29 NOTE — Assessment & Plan Note (Signed)
Rare Ativan use Will refill today

## 2017-07-30 ENCOUNTER — Telehealth: Payer: Self-pay

## 2017-07-30 MED ORDER — LORAZEPAM 0.5 MG PO TABS: 0.5000 mg | ORAL_TABLET | Freq: Two times a day (BID) | ORAL | 0 refills | Status: DC | PRN

## 2017-07-30 NOTE — Telephone Encounter (Signed)
-----   Message from Jearld Fenton, NP sent at 07/29/2017 11:01 AM EDT ----- Please phone in Ativan

## 2017-07-30 NOTE — Telephone Encounter (Signed)
Rx called in to pharmacy. 

## 2017-08-02 ENCOUNTER — Other Ambulatory Visit: Payer: Self-pay | Admitting: Internal Medicine

## 2017-08-19 ENCOUNTER — Other Ambulatory Visit: Payer: Self-pay | Admitting: Internal Medicine

## 2017-08-31 ENCOUNTER — Other Ambulatory Visit: Payer: Self-pay | Admitting: Internal Medicine

## 2017-09-09 ENCOUNTER — Other Ambulatory Visit: Payer: Self-pay | Admitting: Internal Medicine

## 2017-09-09 DIAGNOSIS — Z1231 Encounter for screening mammogram for malignant neoplasm of breast: Secondary | ICD-10-CM

## 2017-09-19 DIAGNOSIS — H66012 Acute suppurative otitis media with spontaneous rupture of ear drum, left ear: Secondary | ICD-10-CM | POA: Diagnosis not present

## 2017-09-30 ENCOUNTER — Ambulatory Visit
Admission: RE | Admit: 2017-09-30 | Discharge: 2017-09-30 | Disposition: A | Discharge status: Home / Self Care | Payer: Medicare Other | Source: Ambulatory Visit | Attending: Internal Medicine | Admitting: Internal Medicine

## 2017-09-30 DIAGNOSIS — Z1231 Encounter for screening mammogram for malignant neoplasm of breast: Secondary | ICD-10-CM | POA: Diagnosis not present

## 2017-10-04 ENCOUNTER — Telehealth: Payer: Self-pay

## 2017-10-04 NOTE — Telephone Encounter (Signed)
Copied from [place] #[id number]. Topic: Quick Communication - See Telephone Encounter >> Oct 04, 2017  3:29 PM Ebony Hail wrote: CRM for notification. See Telephone encounter for:  10/04/17. Pt states she had a msg on myChart that she was confused about.Marland Kitchen Recently had Mammogram.. Was message related to results? Please call pt back.

## 2017-10-04 NOTE — Telephone Encounter (Signed)
Looked in chart and it looks like Mamm was resulted and they printed off letter to mail to pt notifying of results

## 2017-10-08 DIAGNOSIS — H6982 Other specified disorders of Eustachian tube, left ear: Secondary | ICD-10-CM | POA: Diagnosis not present

## 2017-10-08 DIAGNOSIS — H6062 Unspecified chronic otitis externa, left ear: Secondary | ICD-10-CM | POA: Diagnosis not present

## 2017-10-08 DIAGNOSIS — H903 Sensorineural hearing loss, bilateral: Secondary | ICD-10-CM | POA: Diagnosis not present

## 2017-10-20 DIAGNOSIS — Z23 Encounter for immunization: Secondary | ICD-10-CM | POA: Diagnosis not present

## 2017-10-22 DIAGNOSIS — H903 Sensorineural hearing loss, bilateral: Secondary | ICD-10-CM | POA: Diagnosis not present

## 2017-11-12 DIAGNOSIS — H6980 Other specified disorders of Eustachian tube, unspecified ear: Secondary | ICD-10-CM | POA: Diagnosis not present

## 2017-11-12 DIAGNOSIS — H903 Sensorineural hearing loss, bilateral: Secondary | ICD-10-CM | POA: Diagnosis not present

## 2017-12-01 ENCOUNTER — Other Ambulatory Visit: Payer: Self-pay | Admitting: Internal Medicine

## 2017-12-01 NOTE — Telephone Encounter (Signed)
Last filled 07/30/17... Please advise

## 2017-12-02 NOTE — Telephone Encounter (Signed)
Rx called in to pharmacy. 

## 2017-12-02 NOTE — Telephone Encounter (Signed)
Ok to phone in Ativan 

## 2017-12-20 DIAGNOSIS — D1801 Hemangioma of skin and subcutaneous tissue: Secondary | ICD-10-CM | POA: Diagnosis not present

## 2017-12-20 DIAGNOSIS — Z85828 Personal history of other malignant neoplasm of skin: Secondary | ICD-10-CM | POA: Diagnosis not present

## 2017-12-20 DIAGNOSIS — L814 Other melanin hyperpigmentation: Secondary | ICD-10-CM | POA: Diagnosis not present

## 2018-01-26 DIAGNOSIS — Z96652 Presence of left artificial knee joint: Secondary | ICD-10-CM | POA: Diagnosis not present

## 2018-01-26 DIAGNOSIS — M25562 Pain in left knee: Secondary | ICD-10-CM | POA: Diagnosis not present

## 2018-01-26 DIAGNOSIS — M13162 Monoarthritis, not elsewhere classified, left knee: Secondary | ICD-10-CM | POA: Diagnosis not present

## 2018-05-13 ENCOUNTER — Other Ambulatory Visit: Payer: Self-pay

## 2018-05-13 DIAGNOSIS — H903 Sensorineural hearing loss, bilateral: Secondary | ICD-10-CM | POA: Diagnosis not present

## 2018-05-13 DIAGNOSIS — R221 Localized swelling, mass and lump, neck: Secondary | ICD-10-CM | POA: Diagnosis not present

## 2018-05-16 ENCOUNTER — Other Ambulatory Visit: Payer: Self-pay | Admitting: Otolaryngology

## 2018-05-16 DIAGNOSIS — R221 Localized swelling, mass and lump, neck: Secondary | ICD-10-CM

## 2018-05-19 ENCOUNTER — Ambulatory Visit
Admission: RE | Admit: 2018-05-19 | Discharge: 2018-05-19 | Disposition: A | Discharge status: Home / Self Care | Payer: Medicare Other | Source: Ambulatory Visit | Attending: Otolaryngology | Admitting: Otolaryngology

## 2018-05-19 DIAGNOSIS — R221 Localized swelling, mass and lump, neck: Secondary | ICD-10-CM

## 2018-07-31 ENCOUNTER — Other Ambulatory Visit: Payer: Self-pay | Admitting: Internal Medicine

## 2018-07-31 DIAGNOSIS — I10 Essential (primary) hypertension: Secondary | ICD-10-CM

## 2018-07-31 DIAGNOSIS — K219 Gastro-esophageal reflux disease without esophagitis: Secondary | ICD-10-CM

## 2018-08-01 ENCOUNTER — Ambulatory Visit (INDEPENDENT_AMBULATORY_CARE_PROVIDER_SITE_OTHER): Payer: Medicare Other | Admitting: Internal Medicine

## 2018-08-01 ENCOUNTER — Encounter: Payer: Self-pay | Admitting: Internal Medicine

## 2018-08-01 VITALS — BP 128/82 | HR 62 | Temp 98.2°F | Ht 60.5 in | Wt 168.0 lb

## 2018-08-01 DIAGNOSIS — Z0000 Encounter for general adult medical examination without abnormal findings: Secondary | ICD-10-CM | POA: Diagnosis not present

## 2018-08-01 DIAGNOSIS — K219 Gastro-esophageal reflux disease without esophagitis: Secondary | ICD-10-CM

## 2018-08-01 DIAGNOSIS — E559 Vitamin D deficiency, unspecified: Secondary | ICD-10-CM | POA: Diagnosis not present

## 2018-08-01 DIAGNOSIS — F411 Generalized anxiety disorder: Secondary | ICD-10-CM

## 2018-08-01 DIAGNOSIS — E7800 Pure hypercholesterolemia, unspecified: Secondary | ICD-10-CM

## 2018-08-01 DIAGNOSIS — I10 Essential (primary) hypertension: Secondary | ICD-10-CM | POA: Diagnosis not present

## 2018-08-01 LAB — CBC
HEMATOCRIT: 38.2 % (ref 36.0–46.0)
Hemoglobin: 12.9 g/dL (ref 12.0–15.0)
MCHC: 33.8 g/dL (ref 30.0–36.0)
MCV: 95.9 fl (ref 78.0–100.0)
Platelets: 204 10*3/uL (ref 150.0–400.0)
RBC: 3.98 Mil/uL (ref 3.87–5.11)
RDW: 12.6 % (ref 11.5–15.5)
WBC: 6.9 10*3/uL (ref 4.0–10.5)

## 2018-08-01 LAB — COMPREHENSIVE METABOLIC PANEL
ALK PHOS: 65 U/L (ref 39–117)
ALT: 22 U/L (ref 0–35)
AST: 18 U/L (ref 0–37)
Albumin: 4.3 g/dL (ref 3.5–5.2)
BUN: 18 mg/dL (ref 6–23)
CALCIUM: 9.8 mg/dL (ref 8.4–10.5)
CHLORIDE: 98 meq/L (ref 96–112)
CO2: 29 mEq/L (ref 19–32)
Creatinine, Ser: 1.02 mg/dL (ref 0.40–1.20)
GFR: 56.53 mL/min — ABNORMAL LOW (ref >60.00)
Glucose, Bld: 101 mg/dL — ABNORMAL HIGH (ref 70–99)
POTASSIUM: 5 meq/L (ref 3.5–5.1)
SODIUM: 133 meq/L — AB (ref 135–145)
TOTAL PROTEIN: 7.6 g/dL (ref 6.0–8.3)
Total Bilirubin: 0.4 mg/dL (ref 0.2–1.2)

## 2018-08-01 LAB — VITAMIN D 25 HYDROXY (VIT D DEFICIENCY, FRACTURES): VITD: 49.77 ng/mL (ref 30.00–100.00)

## 2018-08-01 LAB — LIPID PANEL
CHOLESTEROL: 179 mg/dL (ref 0–200)
HDL: 50.6 mg/dL (ref >39.00)
LDL CALC: 101 mg/dL — AB (ref 0–99)
NonHDL: 128.39
TRIGLYCERIDES: 135 mg/dL (ref 0.0–149.0)
Total CHOL/HDL Ratio: 4
VLDL: 27 mg/dL (ref 0.0–40.0)

## 2018-08-01 MED ORDER — LORAZEPAM 0.5 MG PO TABS: 0.5000 mg | ORAL_TABLET | Freq: Every day | ORAL | 0 refills | Status: DC | PRN

## 2018-08-01 MED ORDER — SIMVASTATIN 20 MG PO TABS: ORAL_TABLET | ORAL | 3 refills | Status: DC

## 2018-08-01 MED ORDER — ATENOLOL 25 MG PO TABS: ORAL_TABLET | ORAL | 11 refills | Status: DC

## 2018-08-01 MED ORDER — OMEPRAZOLE 20 MG PO CPDR: 20.0000 mg | DELAYED_RELEASE_CAPSULE | Freq: Two times a day (BID) | ORAL | 3 refills | Status: DC

## 2018-08-01 MED ORDER — TRIAMTERENE-HCTZ 37.5-25 MG PO CAPS: 1.0000 | ORAL_CAPSULE | Freq: Every day | ORAL | 3 refills | Status: DC

## 2018-08-01 NOTE — Assessment & Plan Note (Signed)
CMET and Lipid profile today Encouraged her to consume a low fat diet Continue Simvastatin for now, refilled today

## 2018-08-01 NOTE — Assessment & Plan Note (Addendum)
Discussed avoiding foods that trigger reflux Continue Omeprazole, refilled today CBC and CMET today

## 2018-08-01 NOTE — Assessment & Plan Note (Signed)
Chronic but stable on Ativan, refilled today Support offered today

## 2018-08-01 NOTE — Progress Notes (Signed)
HPI:  Pt presents to the clinic today for her Medicare Wellness Exam. She is also due to follow up chronic conditions.  Anxiety: Chronic but stable on Ativan. She takes 1/2 tab daily prn. She would like a refill of Ativan today.  HTN: Her BP today is 128/82. She is taking Triamterene-HCT and Atenolol as prescribed. There is no ECG on file.  HLD: Her last LDL was 105, 07/2017. She denies myalgias on Simvastatin. She does not consume a low fat diet.  GERD: Triggered by spicy foods. She denies breakthrough on Omeprazole. There is no upper GI on file.  Past Medical History:  Diagnosis Date  . Anxiety   . Hyperlipidemia   . Hypertension     Current Outpatient Medications  Medication Sig Dispense Refill  . acetaminophen (TYLENOL) 500 MG tablet Take 1-2 tablets (500-1,000 mg total) by mouth every 4 (four) hours as needed for fever. 30 tablet 0  . aspirin 81 MG tablet Take 81 mg by mouth daily.    Marland Kitchen atenolol (TENORMIN) 25 MG tablet TAKE 1 TABLET (25 MG TOTAL) BY MOUTH DAILY. 30 tablet 11  . calcium carbonate (OS-CAL) 600 MG TABS tablet Take 600 mg by mouth daily.    . Cholecalciferol (VITAMIN D) 2000 UNITS CAPS Take 1 capsule by mouth daily.    Marland Kitchen CRANBERRY PO Take 1 capsule by mouth daily. 4200mg     . ibuprofen (ADVIL,MOTRIN) 200 MG tablet Take 200 mg by mouth as needed.    Marland Kitchen LORazepam (ATIVAN) 0.5 MG tablet Take 1 tablet (0.5 mg total) by mouth daily as needed. 30 tablet 0  . Omega-3 Fatty Acids (FISH OIL) 1200 MG CAPS Take 1 capsule by mouth 2 (two) times daily.    Marland Kitchen omeprazole (PRILOSEC) 20 MG capsule Take 1 capsule (20 mg total) by mouth 2 (two) times daily. 60 capsule 11  . phenylephrine (SUDAFED PE) 10 MG TABS tablet Take 10 mg by mouth 3 times/day as needed-between meals & bedtime.    . simvastatin (ZOCOR) 20 MG tablet TAKE 1 TABLET BY MOUTH EVERY DAY AT 6PM 30 tablet 11  . triamterene-hydrochlorothiazide (DYAZIDE) 37.5-25 MG capsule Take 1 each (1 capsule total) by mouth daily. 30  capsule 11  . triamterene-hydrochlorothiazide (DYAZIDE) 37.5-25 MG capsule TAKE 1 CAPSULE BY MOUTH DAILY. 90 capsule 2  . vitamin B-12 (CYANOCOBALAMIN) 1000 MCG tablet Take 1,000 mcg by mouth daily.    . vitamin E 400 UNIT capsule Take 400 Units by mouth daily.     No current facility-administered medications for this visit.     Allergies  Allergen Reactions  . Adhesive [Tape] Rash    redness    Family History  Problem Relation Age of Onset  . Cancer Mother        lung  . Hypertension Sister   . Breast cancer Paternal Aunt     Social History   Socioeconomic History  . Marital status: Widowed    Spouse name: Not on file  . Number of children: Not on file  . Years of education: Not on file  . Highest education level: Not on file  Occupational History  . Not on file  Social Needs  . Financial resource strain: Not on file  . Food insecurity:    Worry: Not on file    Inability: Not on file  . Transportation needs:    Medical: Not on file    Non-medical: Not on file  Tobacco Use  . Smoking status: Never Smoker  .  Smokeless tobacco: Never Used  Substance and Sexual Activity  . Alcohol use: No  . Drug use: No  . Sexual activity: Never  Lifestyle  . Physical activity:    Days per week: Not on file    Minutes per session: Not on file  . Stress: Not on file  Relationships  . Social connections:    Talks on phone: Not on file    Gets together: Not on file    Attends religious service: Not on file    Active member of club or organization: Not on file    Attends meetings of clubs or organizations: Not on file    Relationship status: Not on file  . Intimate partner violence:    Fear of current or ex partner: Not on file    Emotionally abused: Not on file    Physically abused: Not on file    Forced sexual activity: Not on file  Other Topics Concern  . Not on file  Social History Narrative   ** Merged History Encounter **        Hospitiliaztions: None  Health  Maintenance:    Flu: 08/2017  Tetanus: 12/2013  Pneumovax: 07/2015  Prevnar: 06/2014  Zostavax: 06/2014  Shingrix: 06/2018  Mammogram: 09/2017  Pap Smear: hysterectomy  Bone Density: 02/2014  Colon Screening: never  Eye Doctor: annually  Dental Exam: anually   Providers:   PCP: [name], NP-C  Orthopedics: Dr. Arvella Nigh  I have personally reviewed and have noted:  1. The patient's medical and social history 2. Their use of alcohol, tobacco or illicit drugs 3. Their current medications and supplements 4. The patient's functional ability including ADL's, fall risks, home safety risks and hearing or visual impairment. 5. Diet and physical activities 6. Evidence for depression or mood disorder  Subjective:   Review of Systems:   Constitutional: Denies fever, malaise, fatigue, headache or abrupt weight changes.  HEENT: Denies eye pain, eye redness, ear pain, ringing in the ears, wax buildup, runny nose, nasal congestion, bloody nose, or sore throat. Respiratory: Denies difficulty breathing, shortness of breath, cough or sputum production.   Cardiovascular: Denies chest pain, chest tightness, palpitations or swelling in the hands or feet.  Gastrointestinal: Denies abdominal pain, bloating, constipation, diarrhea or blood in the stool.  GU: Denies urgency, frequency, pain with urination, burning sensation, blood in urine, odor or discharge. Musculoskeletal: Pt reports intermittent left knee pain (hx of TKR), swelling LLE. Denies decrease in range of motion, difficulty with gait, muscle pain.  Skin: Denies redness, rashes, lesions or ulcercations.  Neurological: Denies dizziness, difficulty with memory, difficulty with speech or problems with balance and coordination.  Psych: Pt has a history of anxiety. Denies depression, SI/HI.  No other specific complaints in a complete review of systems (except as listed in HPI above).  Objective:  PE:   BP 128/82   Pulse 62   Temp 98.2 F  (36.8 C) (Oral)   Ht 5' 0.5" (1.537 m)   Wt 168 lb (76.2 kg)   SpO2 98%   BMI 32.27 kg/m   Wt Readings from Last 3 Encounters:  07/29/17 165 lb (74.8 kg)  07/27/16 165 lb (74.8 kg)  03/12/16 160 lb (72.6 kg)    General: Appears her stated age, well developed, well nourished in NAD. Skin: Warm, dry and intact.  HEENT: Head: normal shape and size; Eyes: sclera white, no icterus, conjunctiva pink, PERRLA and EOMs intact; Ears: Tm's gray and intact, normal light reflex; Throat/Mouth: Teeth  present, mucosa pink and moist, no exudate, lesions or ulcerations noted.  Neck: Neck supple, trachea midline. No masses, lumps or thyromegaly present.  Cardiovascular: Normal rate and rhythm. S1,S2 noted.  No murmur, rubs or gallops noted.  No carotid bruits noted. Pulmonary/Chest: Normal effort and positive vesicular breath sounds. No respiratory distress. No wheezes, rales or ronchi noted.  Abdomen: Soft and nontender. Normal bowel sounds. No distention or masses noted. Liver, spleen and kidneys non palpable. Musculoskeletal: Strength 5/5 BUE/BLE. 1+ non pitting edema LLE. No difficulty with gait. Neurological: Alert and oriented. Cranial nerves II-XII grossly intact. Coordination normal.  Psychiatric: Mood and affect normal. Behavior is normal. Judgment and thought content normal.     BMET    Component Value Date/Time   NA 130 (L) 07/29/2017 0906   NA 130 (L) 04/12/2015 0501   K 4.5 07/29/2017 0906   K 4.3 04/12/2015 0501   CL 96 07/29/2017 0906   CL 98 (L) 04/12/2015 0501   CO2 29 07/29/2017 0906   CO2 25 04/12/2015 0501   GLUCOSE 96 07/29/2017 0906   GLUCOSE 102 (H) 04/12/2015 0501   BUN 17 07/29/2017 0906   BUN 14 04/12/2015 0501   CREATININE 0.86 07/29/2017 0906   CREATININE 0.79 04/12/2015 0501   CALCIUM 9.4 07/29/2017 0906   CALCIUM 8.0 (L) 04/12/2015 0501   GFRNONAA >60 04/12/2015 0501   GFRAA >60 04/12/2015 0501    Lipid Panel     Component Value Date/Time   CHOL 186  07/29/2017 0906   TRIG 151.0 (H) 07/29/2017 0906   HDL 50.60 07/29/2017 0906   CHOLHDL 4 07/29/2017 0906   VLDL 30.2 07/29/2017 0906   LDLCALC 105 (H) 07/29/2017 0906    CBC    Component Value Date/Time   WBC 5.9 07/29/2017 0906   RBC 3.83 (L) 07/29/2017 0906   HGB 12.9 07/29/2017 0906   HGB 9.8 (L) 04/13/2015 0444   HCT 37.9 07/29/2017 0906   HCT 37.5 03/27/2015 1437   PLT 203.0 07/29/2017 0906   PLT 178 04/12/2015 0501   MCV 98.9 07/29/2017 0906   MCV 97 03/27/2015 1437   MCH 32.8 03/27/2015 1437   MCHC 34.0 07/29/2017 0906   RDW 12.6 07/29/2017 0906   RDW 12.7 03/27/2015 1437    Hgb A1C Lab Results  Component Value Date   HGBA1C 5.8 03/12/2016      Assessment and Plan:   Medicare Annual Wellness Visit:  Diet: She does eat meat. She consumes fruits and veggies daily. She does eat fried foods. She drinks mostly water and ginger ale. Physical activity: None Depression/mood screen: Negative Hearing: Intact to whispered voice Visual acuity: Grossly normal, performs annual eye exam  ADLs: Capable Fall risk: None Home safety: Good Cognitive evaluation: Intact to orientation, naming, recall and repetition EOL planning: Adv directives, full code/ I agree  Preventative Medicine: Encouraged her to get a flu shot in the fall. Tetanus, pneumovax, prevnar, zostovax and shingrix UTD. Mammogram due 09/2018, she will call to schedule. She no longer needs pap smears. Will repeat bone density in 2020. She will think about colonoscopy vs Cologuard and let us know. Encouraged her to consume a balanced diet and exercise regimen. Advised her to see an eye doctor and dentist annually. Will check CBC, CMET, Lipid and Vit D today.   Next appointment: 1 year, Medicare Wellness Exam   [name], NP

## 2018-08-01 NOTE — Assessment & Plan Note (Addendum)
Controlled on Triamterene HCT and Atenolol, refilled today Reinforced DASH diet and exercise for weight loss CBC and CMET today

## 2018-08-01 NOTE — Assessment & Plan Note (Signed)
Vit D today 

## 2018-08-01 NOTE — Patient Instructions (Signed)
Health Maintenance for Postmenopausal Women Menopause is a normal process in which your reproductive ability comes to an end. This process happens gradually over a span of months to years, usually between the ages of 22 and 9. Menopause is complete when you have missed 12 consecutive menstrual periods. It is important to talk with your health care provider about some of the most common conditions that affect postmenopausal women, such as heart disease, cancer, and bone loss (osteoporosis). Adopting a healthy lifestyle and getting preventive care can help to promote your health and wellness. Those actions can also lower your chances of developing some of these common conditions. What should I know about menopause? During menopause, you may experience a number of symptoms, such as:  Moderate-to-severe hot flashes.  Night sweats.  Decrease in sex drive.  Mood swings.  Headaches.  Tiredness.  Irritability.  Memory problems.  Insomnia.  Choosing to treat or not to treat menopausal changes is an individual decision that you make with your health care provider. What should I know about hormone replacement therapy and supplements? Hormone therapy products are effective for treating symptoms that are associated with menopause, such as hot flashes and night sweats. Hormone replacement carries certain risks, especially as you become older. If you are thinking about using estrogen or estrogen with progestin treatments, discuss the benefits and risks with your health care provider. What should I know about heart disease and stroke? Heart disease, heart attack, and stroke become more likely as you age. This may be due, in part, to the hormonal changes that your body experiences during menopause. These can affect how your body processes dietary fats, triglycerides, and cholesterol. Heart attack and stroke are both medical emergencies. There are many things that you can do to help prevent heart disease  and stroke:  Have your blood pressure checked at least every 1-2 years. High blood pressure causes heart disease and increases the risk of stroke.  If you are 53-22 years old, ask your health care provider if you should take aspirin to prevent a heart attack or a stroke.  Do not use any tobacco products, including cigarettes, chewing tobacco, or electronic cigarettes. If you need help quitting, ask your health care provider.  It is important to eat a healthy diet and maintain a healthy weight. ? Be sure to include plenty of vegetables, fruits, low-fat dairy products, and lean protein. ? Avoid eating foods that are high in solid fats, added sugars, or salt (sodium).  Get regular exercise. This is one of the most important things that you can do for your health. ? Try to exercise for at least 150 minutes each week. The type of exercise that you do should increase your heart rate and make you sweat. This is known as moderate-intensity exercise. ? Try to do strengthening exercises at least twice each week. Do these in addition to the moderate-intensity exercise.  Know your numbers.Ask your health care provider to check your cholesterol and your blood glucose. Continue to have your blood tested as directed by your health care provider.  What should I know about cancer screening? There are several types of cancer. Take the following steps to reduce your risk and to catch any cancer development as early as possible. Breast Cancer  Practice breast self-awareness. ? This means understanding how your breasts normally appear and feel. ? It also means doing regular breast self-exams. Let your health care provider know about any changes, no matter how small.  If you are 40  or older, have a clinician do a breast exam (clinical breast exam or CBE) every year. Depending on your age, family history, and medical history, it may be recommended that you also have a yearly breast X-ray (mammogram).  If you  have a family history of breast cancer, talk with your health care provider about genetic screening.  If you are at high risk for breast cancer, talk with your health care provider about having an MRI and a mammogram every year.  Breast cancer (BRCA) gene test is recommended for women who have family members with BRCA-related cancers. Results of the assessment will determine the need for genetic counseling and BRCA1 and for BRCA2 testing. BRCA-related cancers include these types: ? Breast. This occurs in males or females. ? Ovarian. ? Tubal. This may also be called fallopian tube cancer. ? Cancer of the abdominal or pelvic lining (peritoneal cancer). ? Prostate. ? Pancreatic.  Cervical, Uterine, and Ovarian Cancer Your health care provider may recommend that you be screened regularly for cancer of the pelvic organs. These include your ovaries, uterus, and vagina. This screening involves a pelvic exam, which includes checking for microscopic changes to the surface of your cervix (Pap test).  For women ages 21-65, health care providers may recommend a pelvic exam and a Pap test every three years. For women ages 79-65, they may recommend the Pap test and pelvic exam, combined with testing for human papilloma virus (HPV), every five years. Some types of HPV increase your risk of cervical cancer. Testing for HPV may also be done on women of any age who have unclear Pap test results.  Other health care providers may not recommend any screening for nonpregnant women who are considered low risk for pelvic cancer and have no symptoms. Ask your health care provider if a screening pelvic exam is right for you.  If you have had past treatment for cervical cancer or a condition that could lead to cancer, you need Pap tests and screening for cancer for at least 20 years after your treatment. If Pap tests have been discontinued for you, your risk factors (such as having a new sexual partner) need to be  reassessed to determine if you should start having screenings again. Some women have medical problems that increase the chance of getting cervical cancer. In these cases, your health care provider may recommend that you have screening and Pap tests more often.  If you have a family history of uterine cancer or ovarian cancer, talk with your health care provider about genetic screening.  If you have vaginal bleeding after reaching menopause, tell your health care provider.  There are currently no reliable tests available to screen for ovarian cancer.  Lung Cancer Lung cancer screening is recommended for adults 69-62 years old who are at high risk for lung cancer because of a history of smoking. A yearly low-dose CT scan of the lungs is recommended if you:  Currently smoke.  Have a history of at least 30 pack-years of smoking and you currently smoke or have quit within the past 15 years. A pack-year is smoking an average of one pack of cigarettes per day for one year.  Yearly screening should:  Continue until it has been 15 years since you quit.  Stop if you develop a health problem that would prevent you from having lung cancer treatment.  Colorectal Cancer  This type of cancer can be detected and can often be prevented.  Routine colorectal cancer screening usually begins at  age 42 and continues through age 45.  If you have risk factors for colon cancer, your health care provider may recommend that you be screened at an earlier age.  If you have a family history of colorectal cancer, talk with your health care provider about genetic screening.  Your health care provider may also recommend using home test kits to check for hidden blood in your stool.  A small camera at the end of a tube can be used to examine your colon directly (sigmoidoscopy or colonoscopy). This is done to check for the earliest forms of colorectal cancer.  Direct examination of the colon should be repeated every  5-10 years until age 71. However, if early forms of precancerous polyps or small growths are found or if you have a family history or genetic risk for colorectal cancer, you may need to be screened more often.  Skin Cancer  Check your skin from head to toe regularly.  Monitor any moles. Be sure to tell your health care provider: ? About any new moles or changes in moles, especially if there is a change in a mole's shape or color. ? If you have a mole that is larger than the size of a pencil eraser.  If any of your family members has a history of skin cancer, especially at a young age, talk with your health care provider about genetic screening.  Always use sunscreen. Apply sunscreen liberally and repeatedly throughout the day.  Whenever you are outside, protect yourself by wearing long sleeves, pants, a wide-brimmed hat, and sunglasses.  What should I know about osteoporosis? Osteoporosis is a condition in which bone destruction happens more quickly than new bone creation. After menopause, you may be at an increased risk for osteoporosis. To help prevent osteoporosis or the bone fractures that can happen because of osteoporosis, the following is recommended:  If you are 46-71 years old, get at least 1,000 mg of calcium and at least 600 mg of vitamin D per day.  If you are older than age 55 but younger than age 65, get at least 1,200 mg of calcium and at least 600 mg of vitamin D per day.  If you are older than age 54, get at least 1,200 mg of calcium and at least 800 mg of vitamin D per day.  Smoking and excessive alcohol intake increase the risk of osteoporosis. Eat foods that are rich in calcium and vitamin D, and do weight-bearing exercises several times each week as directed by your health care provider. What should I know about how menopause affects my mental health? Depression may occur at any age, but it is more common as you become older. Common symptoms of depression  include:  Low or sad mood.  Changes in sleep patterns.  Changes in appetite or eating patterns.  Feeling an overall lack of motivation or enjoyment of activities that you previously enjoyed.  Frequent crying spells.  Talk with your health care provider if you think that you are experiencing depression. What should I know about immunizations? It is important that you get and maintain your immunizations. These include:  Tetanus, diphtheria, and pertussis (Tdap) booster vaccine.  Influenza every year before the flu season begins.  Pneumonia vaccine.  Shingles vaccine.  Your health care provider may also recommend other immunizations. This information is not intended to replace advice given to you by your health care provider. Make sure you discuss any questions you have with your health care provider. Document Released: 01/22/2006  Document Revised: 06/19/2016 Document Reviewed: 09/03/2015 Elsevier Interactive Patient Education  2018 [employer]  

## 2018-08-03 ENCOUNTER — Telehealth: Payer: Self-pay | Admitting: Internal Medicine

## 2018-08-03 NOTE — Telephone Encounter (Signed)
Incoming call from patient requesting lab  results.  Provided lab results per [name], NP of 08/01/18.  Patient voiced understanding.

## 2018-10-10 DIAGNOSIS — Z23 Encounter for immunization: Secondary | ICD-10-CM | POA: Diagnosis not present

## 2018-11-17 ENCOUNTER — Other Ambulatory Visit: Payer: Self-pay | Admitting: Internal Medicine

## 2018-11-17 DIAGNOSIS — Z1231 Encounter for screening mammogram for malignant neoplasm of breast: Secondary | ICD-10-CM

## 2018-12-09 ENCOUNTER — Other Ambulatory Visit: Payer: Self-pay | Admitting: Internal Medicine

## 2018-12-09 NOTE — Telephone Encounter (Signed)
Last filled 08/01/18... please advise

## 2018-12-19 DIAGNOSIS — L821 Other seborrheic keratosis: Secondary | ICD-10-CM | POA: Diagnosis not present

## 2018-12-19 DIAGNOSIS — D1801 Hemangioma of skin and subcutaneous tissue: Secondary | ICD-10-CM | POA: Diagnosis not present

## 2018-12-19 DIAGNOSIS — L814 Other melanin hyperpigmentation: Secondary | ICD-10-CM | POA: Diagnosis not present

## 2018-12-19 DIAGNOSIS — L308 Other specified dermatitis: Secondary | ICD-10-CM | POA: Diagnosis not present

## 2018-12-19 DIAGNOSIS — Z85828 Personal history of other malignant neoplasm of skin: Secondary | ICD-10-CM | POA: Diagnosis not present

## 2018-12-26 ENCOUNTER — Ambulatory Visit
Admission: RE | Admit: 2018-12-26 | Discharge: 2018-12-26 | Disposition: A | Discharge status: Home / Self Care | Payer: Medicare Other | Source: Ambulatory Visit | Attending: Internal Medicine | Admitting: Internal Medicine

## 2018-12-26 DIAGNOSIS — Z1231 Encounter for screening mammogram for malignant neoplasm of breast: Secondary | ICD-10-CM | POA: Diagnosis not present

## 2019-08-05 ENCOUNTER — Other Ambulatory Visit: Payer: Self-pay | Admitting: Internal Medicine

## 2019-08-05 DIAGNOSIS — I10 Essential (primary) hypertension: Secondary | ICD-10-CM

## 2019-08-07 ENCOUNTER — Encounter: Payer: Self-pay | Admitting: Internal Medicine

## 2019-08-07 ENCOUNTER — Other Ambulatory Visit: Payer: Self-pay

## 2019-08-07 ENCOUNTER — Ambulatory Visit (INDEPENDENT_AMBULATORY_CARE_PROVIDER_SITE_OTHER): Payer: Medicare Other | Admitting: Internal Medicine

## 2019-08-07 VITALS — BP 144/90 | HR 72 | Temp 97.4°F | Ht 60.5 in | Wt 174.0 lb

## 2019-08-07 DIAGNOSIS — K219 Gastro-esophageal reflux disease without esophagitis: Secondary | ICD-10-CM

## 2019-08-07 DIAGNOSIS — M5431 Sciatica, right side: Secondary | ICD-10-CM

## 2019-08-07 DIAGNOSIS — E7800 Pure hypercholesterolemia, unspecified: Secondary | ICD-10-CM

## 2019-08-07 DIAGNOSIS — E559 Vitamin D deficiency, unspecified: Secondary | ICD-10-CM

## 2019-08-07 DIAGNOSIS — F411 Generalized anxiety disorder: Secondary | ICD-10-CM | POA: Diagnosis not present

## 2019-08-07 DIAGNOSIS — I10 Essential (primary) hypertension: Secondary | ICD-10-CM

## 2019-08-07 DIAGNOSIS — Z780 Asymptomatic menopausal state: Secondary | ICD-10-CM

## 2019-08-07 DIAGNOSIS — Z0000 Encounter for general adult medical examination without abnormal findings: Secondary | ICD-10-CM

## 2019-08-07 LAB — CBC
HCT: 37.2 % (ref 36.0–46.0)
Hemoglobin: 12.7 g/dL (ref 12.0–15.0)
MCHC: 34.2 g/dL (ref 30.0–36.0)
MCV: 97.7 fl (ref 78.0–100.0)
Platelets: 210 10*3/uL (ref 150.0–400.0)
RBC: 3.81 Mil/uL — ABNORMAL LOW (ref 3.87–5.11)
RDW: 12.9 % (ref 11.5–15.5)
WBC: 7.2 10*3/uL (ref 4.0–10.5)

## 2019-08-07 LAB — COMPREHENSIVE METABOLIC PANEL
ALT: 30 U/L (ref 0–35)
AST: 26 U/L (ref 0–37)
Albumin: 4.4 g/dL (ref 3.5–5.2)
Alkaline Phosphatase: 59 U/L (ref 39–117)
BUN: 19 mg/dL (ref 6–23)
CO2: 27 mEq/L (ref 19–32)
Calcium: 9.7 mg/dL (ref 8.4–10.5)
Chloride: 94 mEq/L — ABNORMAL LOW (ref 96–112)
Creatinine, Ser: 1 mg/dL (ref 0.40–1.20)
GFR: 54.26 mL/min — ABNORMAL LOW (ref >60.00)
Glucose, Bld: 96 mg/dL (ref 70–99)
Potassium: 5 mEq/L (ref 3.5–5.1)
Sodium: 130 mEq/L — ABNORMAL LOW (ref 135–145)
Total Bilirubin: 0.4 mg/dL (ref 0.2–1.2)
Total Protein: 7.4 g/dL (ref 6.0–8.3)

## 2019-08-07 LAB — LIPID PANEL
Cholesterol: 196 mg/dL (ref 0–200)
HDL: 48.8 mg/dL (ref >39.00)
LDL Cholesterol: 108 mg/dL — ABNORMAL HIGH (ref 0–99)
NonHDL: 147.42
Total CHOL/HDL Ratio: 4
Triglycerides: 199 mg/dL — ABNORMAL HIGH (ref 0.0–149.0)
VLDL: 39.8 mg/dL (ref 0.0–40.0)

## 2019-08-07 MED ORDER — OMEPRAZOLE 20 MG PO CPDR: 20.0000 mg | DELAYED_RELEASE_CAPSULE | Freq: Two times a day (BID) | ORAL | 3 refills | Status: DC

## 2019-08-07 MED ORDER — LORAZEPAM 0.5 MG PO TABS: 0.5000 mg | ORAL_TABLET | Freq: Every day | ORAL | 0 refills | Status: DC | PRN

## 2019-08-07 MED ORDER — TRIAMTERENE-HCTZ 37.5-25 MG PO CAPS: 1.0000 | ORAL_CAPSULE | Freq: Every day | ORAL | 3 refills | Status: DC

## 2019-08-07 MED ORDER — ATENOLOL 25 MG PO TABS: ORAL_TABLET | ORAL | 3 refills | Status: DC

## 2019-08-07 NOTE — Assessment & Plan Note (Signed)
Vit D level today Encouraged daily weight bearing exercise

## 2019-08-07 NOTE — Assessment & Plan Note (Addendum)
Encouraged her to to avoid spicy foods CBC and CMET today Will try to decrease Omeprazole to daily in am, if you have breakthrough, please increase to BID

## 2019-08-07 NOTE — Assessment & Plan Note (Signed)
Encouraged regular stretching and core strengthening Continue Ibuprofen as needed Continue to follow with Dr. Rudene Christians

## 2019-08-07 NOTE — Patient Instructions (Signed)

## 2019-08-07 NOTE — Assessment & Plan Note (Addendum)
Support offered today Continue Lorazepam prn CSA today. Will not obtain UDS given age and lack of frequent prescriptions

## 2019-08-07 NOTE — Progress Notes (Signed)
HPI:   Pt presents to the clinic today for her Medicare Wellness Exam. She is also due to follow up chronic conditions.  Anxiety: Chronic, stable on Lorazepam. She takes 1/2 tab daily as needed. There is no CSA and UDS on file. Only gets 2 prescription per year.  HLD: Her last LDL was 101, 07/2018. She denies myalgias on Simvastatin. She tries to consume a low fat diet.   HTN: Her BP today is 144/90. She is is taking Atenolol and Triamterene HCTZ as prescribed (but did not take her meds this morning). She does not monitor it at home. There is no ECG on file.  GERD: Triggered by spicy foods. She denies breakthrough on Omeprazole. There is no upper GI on file.   Chronic Sciatica, Right Side: Flares up by excess sitting. She has been taking Ibuprofen as needed with some relief. She follows with Dr. Rudene Christians.   Past Medical History:  Diagnosis Date  . Anxiety   . Hyperlipidemia   . Hypertension     Current Outpatient Medications  Medication Sig Dispense Refill  . acetaminophen (TYLENOL) 500 MG tablet Take 1-2 tablets (500-1,000 mg total) by mouth every 4 (four) hours as needed for fever. 30 tablet 0  . aspirin 81 MG tablet Take 81 mg by mouth daily.    Marland Kitchen atenolol (TENORMIN) 25 MG tablet TAKE 1 TABLET (25 MG TOTAL) BY MOUTH DAILY. 30 tablet 11  . calcium carbonate (OS-CAL) 600 MG TABS tablet Take 600 mg by mouth daily.    . Cholecalciferol (VITAMIN D) 2000 UNITS CAPS Take 1 capsule by mouth daily.    Marland Kitchen CRANBERRY PO Take 1 capsule by mouth daily. 4200mg     . ibuprofen (ADVIL,MOTRIN) 200 MG tablet Take 200 mg by mouth as needed.    Marland Kitchen LORazepam (ATIVAN) 0.5 MG tablet TAKE 1 TABLET (0.5 MG TOTAL) BY MOUTH DAILY AS NEEDED. 30 tablet 0  . Omega-3 Fatty Acids (FISH OIL) 1200 MG CAPS Take 1 capsule by mouth 2 (two) times daily.    Marland Kitchen omeprazole (PRILOSEC) 20 MG capsule Take 1 capsule (20 mg total) by mouth 2 (two) times daily. 180 capsule 3  . phenylephrine (SUDAFED PE) 10 MG TABS tablet Take 10 mg by  mouth 3 times/day as needed-between meals & bedtime.    . simvastatin (ZOCOR) 20 MG tablet TAKE 1 TABLET BY MOUTH EVERY DAY AT 6PM 90 tablet 3  . triamterene-hydrochlorothiazide (DYAZIDE) 37.5-25 MG capsule Take 1 each (1 capsule total) by mouth daily. 90 capsule 3  . vitamin B-12 (CYANOCOBALAMIN) 1000 MCG tablet Take 1,000 mcg by mouth daily.     No current facility-administered medications for this visit.     Allergies  Allergen Reactions  . Adhesive [Tape] Rash    redness    Family History  Problem Relation Age of Onset  . Cancer Mother        lung  . Hypertension Sister   . Breast cancer Paternal Aunt     Social History   Socioeconomic History  . Marital status: Widowed    Spouse name: Not on file  . Number of children: Not on file  . Years of education: Not on file  . Highest education level: Not on file  Occupational History  . Not on file  Social Needs  . Financial resource strain: Not on file  . Food insecurity    Worry: Not on file    Inability: Not on file  . Transportation needs    Medical:  Not on file    Non-medical: Not on file  Tobacco Use  . Smoking status: Never Smoker  . Smokeless tobacco: Never Used  Substance and Sexual Activity  . Alcohol use: No  . Drug use: No  . Sexual activity: Never  Lifestyle  . Physical activity    Days per week: Not on file    Minutes per session: Not on file  . Stress: Not on file  Relationships  . Social Herbalist on phone: Not on file    Gets together: Not on file    Attends religious service: Not on file    Active member of club or organization: Not on file    Attends meetings of clubs or organizations: Not on file    Relationship status: Not on file  . Intimate partner violence    Fear of current or ex partner: Not on file    Emotionally abused: Not on file    Physically abused: Not on file    Forced sexual activity: Not on file  Other Topics Concern  . Not on file  Social History Narrative    ** Merged History Encounter **        Hospitiliaztions: None  Health Maintenance:    Flu: 09/2018  Tetanus: 12/2013  Pneumovax: 07/2015  Prevnar: 06/2014  Zostavax: 06/2014  Shingrix: 06/2018  Mammogram: 12/2018  Pap Smear:  hysterectomy  Bone Density: 02/2014  Colon Screening: never  Eye Doctor: annually  Dental Exam: annually   Providers:   PCP: [name], NP-C  Orthopedist: Rachelle Hora PA-C   I have personally reviewed and have noted:  1. The patient's medical and social history 2. Their use of alcohol, tobacco or illicit drugs 3. Their current medications and supplements 4. The patient's functional ability including ADL's, fall risks, home safety risks and hearing or visual impairment. 5. Diet and physical activities 6. Evidence for depression or mood disorder  Subjective:   Review of Systems:   Constitutional: Pt reports weight gain. Denies fever, malaise, fatigue, headache.  HEENT: Denies eye pain, eye redness, ear pain, ringing in the ears, wax buildup, runny nose, nasal congestion, bloody nose, or sore throat. Respiratory: Denies difficulty breathing, shortness of breath, cough or sputum production.   Cardiovascular: Denies chest pain, chest tightness, palpitations or swelling in the hands or feet.  Gastrointestinal: Denies abdominal pain, bloating, constipation, diarrhea or blood in the stool.  GU: Denies urgency, frequency, pain with urination, burning sensation, blood in urine, odor or discharge. Musculoskeletal: Pt reports low back, right leg pain intermittently. Denies decrease in range of motion, difficulty with gait, muscle pain or joint swelling.  Skin: Denies redness, rashes, lesions or ulcercations.  Neurological: Denies dizziness, difficulty with memory, difficulty with speech or problems with balance and coordination.  Psych: Pt has a history of anxiety. Denies depression, SI/HI.  No other specific complaints in a complete review of systems  (except as listed in HPI above).  Objective:  PE:  BP (!) 144/90   Pulse 72   Temp (!) 97.4 F (36.3 C) (Temporal)   Ht 5' 0.5" (1.537 m)   Wt 174 lb (78.9 kg)   SpO2 97%   BMI 33.42 kg/m   Wt Readings from Last 3 Encounters:  08/01/18 168 lb (76.2 kg)  07/29/17 165 lb (74.8 kg)  07/27/16 165 lb (74.8 kg)    General: Appears her stated age, obese, in NAD. Skin: Warm, dry and intact. No rashes noted. HEENT: Head: normal  shape and size; Eyes: sclera white, no icterus, conjunctiva pink, PERRLA and EOMs intact; Ears: Tm's gray and intact, normal light reflex; Neck: Neck supple, trachea midline. No masses, lumps or thyromegaly present.  Cardiovascular: Normal rate and rhythm. S1,S2 noted.  No murmur, rubs or gallops noted. No JVD or BLE edema. No carotid bruits noted. Pulmonary/Chest: Normal effort and positive vesicular breath sounds. No respiratory distress. No wheezes, rales or ronchi noted.  Abdomen: Soft and nontender. Normal bowel sounds. No distention or masses noted. Liver, spleen and kidneys non palpable. Musculoskeletal: Tender to palpation over the lumbar spine. Strength 5/5 BUE/BLE. No signs of joint swelling.  Neurological: Alert and oriented. Cranial nerves II-XII grossly intact. Coordination normal.  Psychiatric: Mood and affect normal. Behavior is normal. Judgment and thought content normal.     BMET    Component Value Date/Time   NA 133 (L) 08/01/2018 0849   NA 130 (L) 04/12/2015 0501   K 5.0 08/01/2018 0849   K 4.3 04/12/2015 0501   CL 98 08/01/2018 0849   CL 98 (L) 04/12/2015 0501   CO2 29 08/01/2018 0849   CO2 25 04/12/2015 0501   GLUCOSE 101 (H) 08/01/2018 0849   GLUCOSE 102 (H) 04/12/2015 0501   BUN 18 08/01/2018 0849   BUN 14 04/12/2015 0501   CREATININE 1.02 08/01/2018 0849   CREATININE 0.79 04/12/2015 0501   CALCIUM 9.8 08/01/2018 0849   CALCIUM 8.0 (L) 04/12/2015 0501   GFRNONAA >60 04/12/2015 0501   GFRAA >60 04/12/2015 0501    Lipid Panel      Component Value Date/Time   CHOL 179 08/01/2018 0849   TRIG 135.0 08/01/2018 0849   HDL 50.60 08/01/2018 0849   CHOLHDL 4 08/01/2018 0849   VLDL 27.0 08/01/2018 0849   LDLCALC 101 (H) 08/01/2018 0849    CBC    Component Value Date/Time   WBC 6.9 08/01/2018 0849   RBC 3.98 08/01/2018 0849   HGB 12.9 08/01/2018 0849   HGB 9.8 (L) 04/13/2015 0444   HCT 38.2 08/01/2018 0849   HCT 37.5 03/27/2015 1437   PLT 204.0 08/01/2018 0849   PLT 178 04/12/2015 0501   MCV 95.9 08/01/2018 0849   MCV 97 03/27/2015 1437   MCH 32.8 03/27/2015 1437   MCHC 33.8 08/01/2018 0849   RDW 12.6 08/01/2018 0849   RDW 12.7 03/27/2015 1437    Hgb A1C Lab Results  Component Value Date   HGBA1C 5.8 03/12/2016      Assessment and Plan:   Medicare Annual Wellness Visit:  Diet: She does eat meat. She consumes fruits and veggies daily. She tries to avoid fried foods but does eat some. She drinks mostly water and Ginger Ale Physical activity: Sedentary Depression/mood screen: Negative, PHQ 9 score of 0 Hearing: Intact to whispered voice Visual acuity: Grossly normal, performs annual eye exam  ADLs: Capable Fall risk: None Home safety: Good Cognitive evaluation: Intact to orientation, naming, recall and repetition EOL planning: Adv directives, full code/ I agree  Preventative Medicine: Encouraged her to get a flu shot in the fall. Tetanus, pneumovax, prevnar, zostovax and shingrix UTD. Mammogram due 20121. Will order bone density to have done at same time as mammogram. She no longer needs pap smears. She is going to think about the Cologuard vs colonoscopy but has not decided at this time- information provided on both. Encouraged her to consume a balanced diet and exercise regimen. Advised her to see an eye doctor and dentist annually. Will check CBC, CMET,  Lipid and Vit D today. Due dates for screening exams given to pt as part of her AVS.   Next appointment: 1 year, Medicare Wellness  Exam   [name], NP

## 2019-08-07 NOTE — Assessment & Plan Note (Signed)
CMET and Lipid profile today Encouraged her to consume a low fat diet Continue Simvastatin- will adjust if needed based on labs

## 2019-08-07 NOTE — Assessment & Plan Note (Addendum)
Continue Atenolol and Triamterene HCT Will have her schedule nurse visit at least 2 hours after she has taken her medication, for a BP check CBC and CMET today Reinforced DASH diet and exercise for weight loss

## 2019-08-08 ENCOUNTER — Telehealth: Payer: Self-pay | Admitting: Internal Medicine

## 2019-08-08 ENCOUNTER — Other Ambulatory Visit: Payer: Self-pay | Admitting: Internal Medicine

## 2019-08-08 DIAGNOSIS — Z1231 Encounter for screening mammogram for malignant neoplasm of breast: Secondary | ICD-10-CM

## 2019-08-08 LAB — VITAMIN D 25 HYDROXY (VIT D DEFICIENCY, FRACTURES): VITD: 50.41 ng/mL (ref 30.00–100.00)

## 2019-08-08 NOTE — Telephone Encounter (Signed)
LMOM for pt to c/b to schedule DEXA

## 2019-08-08 NOTE — Telephone Encounter (Signed)
Patient returned your call in regards to her bone Density C/B #  [id number]

## 2019-08-09 MED ORDER — LOSARTAN POTASSIUM-HCTZ 50-12.5 MG PO TABS: 0.5000 | ORAL_TABLET | Freq: Every day | ORAL | 0 refills | Status: DC

## 2019-08-09 NOTE — Addendum Note (Signed)
Addended by: Lurlean Nanny on: 08/09/2019 05:03 PM   Modules accepted: Orders

## 2019-08-11 ENCOUNTER — Ambulatory Visit: Payer: Medicare Other

## 2019-08-24 ENCOUNTER — Other Ambulatory Visit: Payer: Self-pay

## 2019-08-24 ENCOUNTER — Ambulatory Visit (INDEPENDENT_AMBULATORY_CARE_PROVIDER_SITE_OTHER): Payer: Medicare Other | Admitting: Internal Medicine

## 2019-08-24 ENCOUNTER — Encounter: Payer: Self-pay | Admitting: Internal Medicine

## 2019-08-24 VITALS — BP 136/84 | HR 79 | Temp 98.2°F | Wt 173.0 lb

## 2019-08-24 DIAGNOSIS — R944 Abnormal results of kidney function studies: Secondary | ICD-10-CM | POA: Diagnosis not present

## 2019-08-24 DIAGNOSIS — I10 Essential (primary) hypertension: Secondary | ICD-10-CM | POA: Diagnosis not present

## 2019-08-24 LAB — BASIC METABOLIC PANEL
BUN: 15 mg/dL (ref 6–23)
CO2: 28 mEq/L (ref 19–32)
Calcium: 9.4 mg/dL (ref 8.4–10.5)
Chloride: 98 mEq/L (ref 96–112)
Creatinine, Ser: 0.88 mg/dL (ref 0.40–1.20)
GFR: 62.88 mL/min (ref >60.00)
Glucose, Bld: 92 mg/dL (ref 70–99)
Potassium: 4.6 mEq/L (ref 3.5–5.1)
Sodium: 132 mEq/L — ABNORMAL LOW (ref 135–145)

## 2019-08-24 MED ORDER — TERBINAFINE HCL 250 MG PO TABS: 250.0000 mg | ORAL_TABLET | Freq: Every day | ORAL | 0 refills | Status: DC

## 2019-08-24 MED ORDER — LOSARTAN POTASSIUM-HCTZ 50-12.5 MG PO TABS: 0.5000 | ORAL_TABLET | Freq: Every day | ORAL | 3 refills | Status: DC

## 2019-08-24 NOTE — Patient Instructions (Signed)
Fungal Nail Infection A fungal nail infection is a common infection of the toenails or fingernails. This condition affects toenails more often than fingernails. It often affects the great, or big, toes. More than one nail may be infected. The condition can be passed from person to person (is contagious). What are the causes? This condition is caused by a fungus. Several types of fungi can cause the infection. These fungi are common in moist and warm areas. If your hands or feet come into contact with the fungus, it may get into a crack in your fingernail or toenail and cause the infection. What increases the risk? The following factors may make you more likely to develop this condition:  Being female.  Being of older age.  Living with someone who has the fungus.  Walking barefoot in areas where the fungus thrives, such as showers or locker rooms.  Wearing shoes and socks that cause your feet to sweat.  Having a nail injury or a recent nail surgery.  Having certain medical conditions, such as: ? Athlete's foot. ? Diabetes. ? Psoriasis. ? Poor circulation. ? A weak body defense system (immune system). What are the signs or symptoms? Symptoms of this condition include:  A pale spot on the nail.  Thickening of the nail.  A nail that becomes yellow or brown.  A brittle or ragged nail edge.  A crumbling nail.  A nail that has lifted away from the nail bed. How is this diagnosed? This condition is diagnosed with a physical exam. Your health care provider may take a scraping or clipping from your nail to test for the fungus. How is this treated? Treatment is not needed for mild infections. If you have significant nail changes, treatment may include:  Antifungal medicines taken by mouth (orally). You may need to take the medicine for several weeks or several months, and you may not see the results for a long time. These medicines can cause side effects. Ask your health care provider  what problems to watch for.  Antifungal nail polish or nail cream. These may be used along with oral antifungal medicines.  Laser treatment of the nail.  Surgery to remove the nail. This may be needed for the most severe infections. It can take a long time, usually up to a year, for the infection to go away. The infection may also come back. Follow these instructions at home: Medicines  Take or apply over-the-counter and prescription medicines only as told by your health care provider.  Ask your health care provider about using over-the-counter mentholated ointment on your nails. Nail care  Trim your nails often.  Wash and dry your hands and feet every day.  Keep your feet dry: ? Wear absorbent socks, and change your socks frequently. ? Wear shoes that allow air to circulate, such as sandals or canvas tennis shoes. Throw out old shoes.  Do not use artificial nails.  If you go to a nail salon, make sure you choose one that uses clean instruments.  Use antifungal foot powder on your feet and in your shoes. General instructions  Do not share personal items, such as towels or nail clippers.  Do not walk barefoot in shower rooms or locker rooms.  Wear rubber gloves if you are working with your hands in wet areas.  Keep all follow-up visits as told by your health care provider. This is important. Contact a health care provider if: Your infection is not getting better or it is getting worse   after several months. Summary  A fungal nail infection is a common infection of the toenails or fingernails.  Treatment is not needed for mild infections. If you have significant nail changes, treatment may include taking medicine orally and applying medicine to your nails.  It can take a long time, usually up to a year, for the infection to go away. The infection may also come back.  Take or apply over-the-counter and prescription medicines only as told by your health care provider.   Follow instructions for taking care of your nails to help prevent infection from coming back or spreading. This information is not intended to replace advice given to you by your health care provider. Make sure you discuss any questions you have with your health care provider. Document Released: 11/27/2000 Document Revised: 03/23/2019 Document Reviewed: 05/06/2018 Elsevier Patient Education  2020 Elsevier Inc[employer]  

## 2019-08-24 NOTE — Progress Notes (Signed)
Subjective:    Patient ID: Kristen Powell, female    DOB: 1945/12/27, 73 y.o.   MRN: [id number]  HPI  Pt presents to the clinic today for 2 week follow up of HTN. At her last visit, she was switched from Triamterene HCT to Losartan HCT due to decreased GFR. She has been taking the medication as prescribed. Her BP today is 136/84. She is taking Atenolol as well. She denies headaches, dizziness, visual changes, chest pain, shortness of breath or increased edema.  She also reports toenail fungus. She is interested in Terbinafine. She has been trying topical medication without any relief.  Review of Systems    Past Medical History:  Diagnosis Date  . Anxiety   . Hyperlipidemia   . Hypertension     Current Outpatient Medications  Medication Sig Dispense Refill  . acetaminophen (TYLENOL) 500 MG tablet Take 1-2 tablets (500-1,000 mg total) by mouth every 4 (four) hours as needed for fever. 30 tablet 0  . aspirin 81 MG tablet Take 81 mg by mouth daily.    Marland Kitchen atenolol (TENORMIN) 25 MG tablet TAKE 1 TABLET (25 MG TOTAL) BY MOUTH DAILY. 90 tablet 3  . calcium carbonate (OS-CAL) 600 MG TABS tablet Take 600 mg by mouth daily.    . Cholecalciferol (VITAMIN D) 2000 UNITS CAPS Take 1 capsule by mouth daily.    Marland Kitchen CRANBERRY PO Take 1 capsule by mouth daily. 4200mg     . ibuprofen (ADVIL,MOTRIN) 200 MG tablet Take 200 mg by mouth as needed.    Marland Kitchen LORazepam (ATIVAN) 0.5 MG tablet Take 1 tablet (0.5 mg total) by mouth daily as needed. 30 tablet 0  . losartan-hydrochlorothiazide (HYZAAR) 50-12.5 MG tablet Take 0.5 tablets by mouth daily. 15 tablet 0  . Omega-3 Fatty Acids (FISH OIL) 1200 MG CAPS Take 1 capsule by mouth 2 (two) times daily.    Marland Kitchen omeprazole (PRILOSEC) 20 MG capsule Take 1 capsule (20 mg total) by mouth 2 (two) times daily. 180 capsule 3  . phenylephrine (SUDAFED PE) 10 MG TABS tablet Take 10 mg by mouth 3 times/day as needed-between meals & bedtime.    . simvastatin (ZOCOR) 20 MG tablet  TAKE 1 TABLET BY MOUTH EVERY DAY AT 6PM 90 tablet 3  . vitamin B-12 (CYANOCOBALAMIN) 1000 MCG tablet Take 1,000 mcg by mouth daily.     No current facility-administered medications for this visit.     Allergies  Allergen Reactions  . Adhesive [Tape] Rash    redness    Family History  Problem Relation Age of Onset  . Cancer Mother        lung  . Hypertension Sister   . Breast cancer Paternal Aunt     Social History   Socioeconomic History  . Marital status: Widowed    Spouse name: Not on file  . Number of children: Not on file  . Years of education: Not on file  . Highest education level: Not on file  Occupational History  . Not on file  Social Needs  . Financial resource strain: Not on file  . Food insecurity    Worry: Not on file    Inability: Not on file  . Transportation needs    Medical: Not on file    Non-medical: Not on file  Tobacco Use  . Smoking status: Never Smoker  . Smokeless tobacco: Never Used  Substance and Sexual Activity  . Alcohol use: No  . Drug use: No  . Sexual activity:  Never  Lifestyle  . Physical activity    Days per week: Not on file    Minutes per session: Not on file  . Stress: Not on file  Relationships  . Social Herbalist on phone: Not on file    Gets together: Not on file    Attends religious service: Not on file    Active member of club or organization: Not on file    Attends meetings of clubs or organizations: Not on file    Relationship status: Not on file  . Intimate partner violence    Fear of current or ex partner: Not on file    Emotionally abused: Not on file    Physically abused: Not on file    Forced sexual activity: Not on file  Other Topics Concern  . Not on file  Social History Narrative   ** Merged History Encounter **         Constitutional: Denies fever, malaise, fatigue, headache or abrupt weight changes.  Respiratory: Denies difficulty breathing, shortness of breath, cough or sputum  production.   Cardiovascular: Denies chest pain, chest tightness, palpitations or swelling in the hands or feet.  Skin: Pt reports bilateral toenail fungus. Denies redness, rashes, lesions or ulcercations.  Neurological: Denies dizziness, difficulty with memory, difficulty with speech or problems with balance and coordination.    No other specific complaints in a complete review of systems (except as listed in HPI above).     Objective:   Physical Exam   BP 136/84   Pulse 79   Temp 98.2 F (36.8 C) (Temporal)   Wt 173 lb (78.5 kg)   SpO2 99%   BMI 33.23 kg/m  Wt Readings from Last 3 Encounters:  08/24/19 173 lb (78.5 kg)  08/07/19 174 lb (78.9 kg)  08/01/18 168 lb (76.2 kg)    General: Appears her stated age, obese, in NAD. Skin: Warm, dry and intact. Toenail fungus noted bilaterally. Cardiovascular: Normal rate and rhythm. S1,S2 noted.  No murmur, rubs or gallops noted. Trace nonpitting  BLE edema.  Pulmonary/Chest: Normal effort and positive vesicular breath sounds. No respiratory distress. No wheezes, rales or ronchi noted.  Neurological: Alert and oriented.   BMET    Component Value Date/Time   NA 130 (L) 08/07/2019 0901   NA 130 (L) 04/12/2015 0501   K 5.0 08/07/2019 0901   K 4.3 04/12/2015 0501   CL 94 (L) 08/07/2019 0901   CL 98 (L) 04/12/2015 0501   CO2 27 08/07/2019 0901   CO2 25 04/12/2015 0501   GLUCOSE 96 08/07/2019 0901   GLUCOSE 102 (H) 04/12/2015 0501   BUN 19 08/07/2019 0901   BUN 14 04/12/2015 0501   CREATININE 1.00 08/07/2019 0901   CREATININE 0.79 04/12/2015 0501   CALCIUM 9.7 08/07/2019 0901   CALCIUM 8.0 (L) 04/12/2015 0501   GFRNONAA >60 04/12/2015 0501   GFRAA >60 04/12/2015 0501    Lipid Panel     Component Value Date/Time   CHOL 196 08/07/2019 0901   TRIG 199.0 (H) 08/07/2019 0901   HDL 48.80 08/07/2019 0901   CHOLHDL 4 08/07/2019 0901   VLDL 39.8 08/07/2019 0901   LDLCALC 108 (H) 08/07/2019 0901    CBC    Component Value  Date/Time   WBC 7.2 08/07/2019 0901   RBC 3.81 (L) 08/07/2019 0901   HGB 12.7 08/07/2019 0901   HGB 9.8 (L) 04/13/2015 0444   HCT 37.2 08/07/2019 0901   HCT 37.5  03/27/2015 1437   PLT 210.0 08/07/2019 0901   PLT 178 04/12/2015 0501   MCV 97.7 08/07/2019 0901   MCV 97 03/27/2015 1437   MCH 32.8 03/27/2015 1437   MCHC 34.2 08/07/2019 0901   RDW 12.9 08/07/2019 0901   RDW 12.7 03/27/2015 1437    Hgb A1C Lab Results  Component Value Date   HGBA1C 5.8 03/12/2016           Assessment & Plan:   Toenail Fungus:  CMET reviewed RX for Terbinafine 250 mg PO daily x 4 weeks Advised her to monitor for yellowing of the eyes or fingernails, abdominal pain, nausea or vomiting Repeat CMET in 4 weeks lab only  Return precautions discussed [name], NP

## 2019-08-24 NOTE — Assessment & Plan Note (Signed)
Reasonable control on Losartan HCT BMET today Reinforced DASH diet and exercise for weight loss.

## 2019-09-15 ENCOUNTER — Telehealth: Payer: Self-pay | Admitting: Internal Medicine

## 2019-09-15 MED ORDER — SIMVASTATIN 20 MG PO TABS: ORAL_TABLET | ORAL | 2 refills | Status: DC

## 2019-09-15 NOTE — Telephone Encounter (Signed)
Rx sent through e-scribe  

## 2019-09-15 NOTE — Telephone Encounter (Signed)
Patient said she tried to get a refill for Simvastatin 20 mg at [facility] and they told her she needs a new prescription.  Patient's out of her medication.

## 2019-09-18 ENCOUNTER — Other Ambulatory Visit: Payer: Self-pay | Admitting: Internal Medicine

## 2019-09-18 DIAGNOSIS — B351 Tinea unguium: Secondary | ICD-10-CM

## 2019-09-18 DIAGNOSIS — Z5181 Encounter for therapeutic drug level monitoring: Secondary | ICD-10-CM

## 2019-09-20 ENCOUNTER — Other Ambulatory Visit: Payer: Self-pay | Admitting: Internal Medicine

## 2019-09-25 ENCOUNTER — Other Ambulatory Visit: Payer: Self-pay | Admitting: Internal Medicine

## 2019-09-25 ENCOUNTER — Other Ambulatory Visit (INDEPENDENT_AMBULATORY_CARE_PROVIDER_SITE_OTHER): Payer: Medicare Other

## 2019-09-25 DIAGNOSIS — B351 Tinea unguium: Secondary | ICD-10-CM

## 2019-09-25 DIAGNOSIS — Z5181 Encounter for therapeutic drug level monitoring: Secondary | ICD-10-CM | POA: Diagnosis not present

## 2019-09-25 LAB — COMPREHENSIVE METABOLIC PANEL
ALT: 35 U/L (ref 0–35)
AST: 29 U/L (ref 0–37)
Albumin: 4.1 g/dL (ref 3.5–5.2)
Alkaline Phosphatase: 63 U/L (ref 39–117)
BUN: 17 mg/dL (ref 6–23)
CO2: 28 mEq/L (ref 19–32)
Calcium: 9.4 mg/dL (ref 8.4–10.5)
Chloride: 98 mEq/L (ref 96–112)
Creatinine, Ser: 0.97 mg/dL (ref 0.40–1.20)
GFR: 56.19 mL/min — ABNORMAL LOW (ref >60.00)
Glucose, Bld: 86 mg/dL (ref 70–99)
Potassium: 4.9 mEq/L (ref 3.5–5.1)
Sodium: 133 mEq/L — ABNORMAL LOW (ref 135–145)
Total Bilirubin: 0.4 mg/dL (ref 0.2–1.2)
Total Protein: 7.2 g/dL (ref 6.0–8.3)

## 2019-09-28 NOTE — Telephone Encounter (Signed)
Last filled 08/24/2019, CMP in chart from 09/25/2019... please advise

## 2019-10-02 DIAGNOSIS — Z23 Encounter for immunization: Secondary | ICD-10-CM | POA: Diagnosis not present

## 2019-10-18 ENCOUNTER — Ambulatory Visit
Admission: RE | Admit: 2019-10-18 | Discharge: 2019-10-18 | Disposition: A | Discharge status: Home / Self Care | Payer: Medicare Other | Source: Ambulatory Visit | Attending: Internal Medicine | Admitting: Internal Medicine

## 2019-10-18 ENCOUNTER — Other Ambulatory Visit: Payer: Self-pay

## 2019-10-18 ENCOUNTER — Other Ambulatory Visit: Payer: Medicare Other

## 2019-10-18 DIAGNOSIS — Z780 Asymptomatic menopausal state: Secondary | ICD-10-CM

## 2019-10-18 DIAGNOSIS — M85851 Other specified disorders of bone density and structure, right thigh: Secondary | ICD-10-CM | POA: Diagnosis not present

## 2019-11-01 ENCOUNTER — Other Ambulatory Visit: Payer: Self-pay | Admitting: Internal Medicine

## 2019-11-01 MED ORDER — LORAZEPAM 0.5 MG PO TABS: 0.5000 mg | ORAL_TABLET | Freq: Every day | ORAL | 0 refills | Status: DC | PRN

## 2019-11-01 NOTE — Telephone Encounter (Signed)
Patient is requesting a refill  Lorazepam 0.5 mg   3 tablets left   Patient stated she takes this as needed and right now with things going on at home she is really needing this medication    [facility] [place]

## 2019-11-01 NOTE — Telephone Encounter (Signed)
Last filled 08/07/2019.Marland KitchenMarland KitchenMarland Kitchen please advise

## 2019-11-22 ENCOUNTER — Other Ambulatory Visit: Payer: Self-pay | Admitting: Internal Medicine

## 2019-11-28 ENCOUNTER — Encounter: Payer: Self-pay | Admitting: Internal Medicine

## 2019-11-28 NOTE — Telephone Encounter (Signed)
msg sent via mychart requesting pt to give [place] an update on if she noticed any improvement with taking the Lamisil and also she will need to schedule a lab only appointment to recheck liver enzymes before it can be refilled as well.

## 2019-12-21 DIAGNOSIS — L821 Other seborrheic keratosis: Secondary | ICD-10-CM | POA: Diagnosis not present

## 2019-12-21 DIAGNOSIS — Z85828 Personal history of other malignant neoplasm of skin: Secondary | ICD-10-CM | POA: Diagnosis not present

## 2019-12-21 DIAGNOSIS — L905 Scar conditions and fibrosis of skin: Secondary | ICD-10-CM | POA: Diagnosis not present

## 2019-12-21 DIAGNOSIS — D1801 Hemangioma of skin and subcutaneous tissue: Secondary | ICD-10-CM | POA: Diagnosis not present

## 2019-12-21 DIAGNOSIS — L814 Other melanin hyperpigmentation: Secondary | ICD-10-CM | POA: Diagnosis not present

## 2019-12-28 ENCOUNTER — Ambulatory Visit
Admission: RE | Admit: 2019-12-28 | Discharge: 2019-12-28 | Disposition: A | Discharge status: Home / Self Care | Payer: Medicare Other | Source: Ambulatory Visit | Attending: Internal Medicine | Admitting: Internal Medicine

## 2019-12-28 ENCOUNTER — Other Ambulatory Visit: Payer: Self-pay

## 2019-12-28 DIAGNOSIS — Z1231 Encounter for screening mammogram for malignant neoplasm of breast: Secondary | ICD-10-CM | POA: Diagnosis not present

## 2020-05-30 ENCOUNTER — Other Ambulatory Visit: Payer: Self-pay | Admitting: Internal Medicine

## 2020-07-22 ENCOUNTER — Other Ambulatory Visit: Payer: Self-pay | Admitting: Internal Medicine

## 2020-07-22 DIAGNOSIS — I10 Essential (primary) hypertension: Secondary | ICD-10-CM

## 2020-07-29 ENCOUNTER — Other Ambulatory Visit: Payer: Self-pay | Admitting: Internal Medicine

## 2020-07-29 DIAGNOSIS — K219 Gastro-esophageal reflux disease without esophagitis: Secondary | ICD-10-CM

## 2020-08-01 DIAGNOSIS — Z961 Presence of intraocular lens: Secondary | ICD-10-CM | POA: Diagnosis not present

## 2020-08-08 ENCOUNTER — Ambulatory Visit (INDEPENDENT_AMBULATORY_CARE_PROVIDER_SITE_OTHER): Payer: Medicare Other | Admitting: Internal Medicine

## 2020-08-08 ENCOUNTER — Encounter: Payer: Self-pay | Admitting: Internal Medicine

## 2020-08-08 ENCOUNTER — Other Ambulatory Visit: Payer: Self-pay

## 2020-08-08 VITALS — BP 184/96 | HR 70 | Ht 60.5 in | Wt 171.0 lb

## 2020-08-08 DIAGNOSIS — Z1211 Encounter for screening for malignant neoplasm of colon: Secondary | ICD-10-CM | POA: Diagnosis not present

## 2020-08-08 DIAGNOSIS — M5431 Sciatica, right side: Secondary | ICD-10-CM | POA: Diagnosis not present

## 2020-08-08 DIAGNOSIS — K219 Gastro-esophageal reflux disease without esophagitis: Secondary | ICD-10-CM | POA: Diagnosis not present

## 2020-08-08 DIAGNOSIS — B351 Tinea unguium: Secondary | ICD-10-CM | POA: Diagnosis not present

## 2020-08-08 DIAGNOSIS — E7800 Pure hypercholesterolemia, unspecified: Secondary | ICD-10-CM

## 2020-08-08 DIAGNOSIS — F411 Generalized anxiety disorder: Secondary | ICD-10-CM

## 2020-08-08 DIAGNOSIS — M170 Bilateral primary osteoarthritis of knee: Secondary | ICD-10-CM

## 2020-08-08 DIAGNOSIS — Z0000 Encounter for general adult medical examination without abnormal findings: Secondary | ICD-10-CM | POA: Diagnosis not present

## 2020-08-08 DIAGNOSIS — E559 Vitamin D deficiency, unspecified: Secondary | ICD-10-CM | POA: Diagnosis not present

## 2020-08-08 DIAGNOSIS — I10 Essential (primary) hypertension: Secondary | ICD-10-CM

## 2020-08-08 LAB — CBC
HCT: 35.8 % — ABNORMAL LOW (ref 36.0–46.0)
Hemoglobin: 12.3 g/dL (ref 12.0–15.0)
MCHC: 34.2 g/dL (ref 30.0–36.0)
MCV: 97.2 fl (ref 78.0–100.0)
Platelets: 204 10*3/uL (ref 150.0–400.0)
RBC: 3.68 Mil/uL — ABNORMAL LOW (ref 3.87–5.11)
RDW: 12.7 % (ref 11.5–15.5)
WBC: 6.9 10*3/uL (ref 4.0–10.5)

## 2020-08-08 LAB — COMPREHENSIVE METABOLIC PANEL
ALT: 22 U/L (ref 0–35)
AST: 21 U/L (ref 0–37)
Albumin: 4.1 g/dL (ref 3.5–5.2)
Alkaline Phosphatase: 67 U/L (ref 39–117)
BUN: 14 mg/dL (ref 6–23)
CO2: 28 mEq/L (ref 19–32)
Calcium: 9.3 mg/dL (ref 8.4–10.5)
Chloride: 97 mEq/L (ref 96–112)
Creatinine, Ser: 0.91 mg/dL (ref 0.40–1.20)
GFR: 60.34 mL/min (ref >60.00)
Glucose, Bld: 91 mg/dL (ref 70–99)
Potassium: 4.9 mEq/L (ref 3.5–5.1)
Sodium: 131 mEq/L — ABNORMAL LOW (ref 135–145)
Total Bilirubin: 0.3 mg/dL (ref 0.2–1.2)
Total Protein: 7.4 g/dL (ref 6.0–8.3)

## 2020-08-08 LAB — LIPID PANEL
Cholesterol: 167 mg/dL (ref 0–200)
HDL: 47.7 mg/dL (ref >39.00)
LDL Cholesterol: 88 mg/dL (ref 0–99)
NonHDL: 119.53
Total CHOL/HDL Ratio: 4
Triglycerides: 156 mg/dL — ABNORMAL HIGH (ref 0.0–149.0)
VLDL: 31.2 mg/dL (ref 0.0–40.0)

## 2020-08-08 LAB — VITAMIN D 25 HYDROXY (VIT D DEFICIENCY, FRACTURES): VITD: 47.41 ng/mL (ref 30.00–100.00)

## 2020-08-08 MED ORDER — LORAZEPAM 0.5 MG PO TABS: 0.5000 mg | ORAL_TABLET | Freq: Every day | ORAL | 0 refills | Status: DC | PRN

## 2020-08-08 MED ORDER — LOSARTAN POTASSIUM-HCTZ 50-12.5 MG PO TABS: 0.5000 | ORAL_TABLET | Freq: Every day | ORAL | 3 refills | Status: DC

## 2020-08-08 NOTE — Assessment & Plan Note (Signed)
Currently not an issue Will monitor 

## 2020-08-08 NOTE — Assessment & Plan Note (Signed)
Vit D today 

## 2020-08-08 NOTE — Progress Notes (Signed)
HPI:  Pt presents to the clinic today for her annual Medicare Wellness Exam. She is also due to follow up chronic conditions.   HTN: Her BP today is 180/100. She reports she only took her Losartan HCT about 1 hour ago and she has not taken her Atenolol today. She reports recent increase in stress- her sister in law died 2 weeks ago, she planned the funeral. There is no ECG on file.   HLD: Her last LDL was 108, triglycerides 199, 07/2019. She denies myalgias on Simvastatin. She tries to consume a low fat diet.  GERD: She denies breakthrough on Omeprazole. There is no upper GI on file.  OA: Generalized. She takes Ibuprofen as needed with good relief of symptoms.  Anxiety: Intermittent. She uses Ativan as needed- has not had in 3 weeks. She would like a refill of this today to keep on hand. She is not currently seeing a therapist. She denies depression, SI/HI.   Toenail Fungus: She is going to make an appt with a podiatrist for toenail removal. She is not currently taking Terbinafine.  Chronic Sciatica Right Side: Currently not an issue.   Past Medical History:  Diagnosis Date  . Anxiety   . Hyperlipidemia   . Hypertension     Current Outpatient Medications  Medication Sig Dispense Refill  . acetaminophen (TYLENOL) 500 MG tablet Take 1-2 tablets (500-1,000 mg total) by mouth every 4 (four) hours as needed for fever. 30 tablet 0  . aspirin 81 MG tablet Take 81 mg by mouth daily.    Marland Kitchen atenolol (TENORMIN) 25 MG tablet TAKE 1 TABLET BY MOUTH EVERY DAY 90 tablet 0  . calcium carbonate (OS-CAL) 600 MG TABS tablet Take 600 mg by mouth daily.    . Cholecalciferol (VITAMIN D) 2000 UNITS CAPS Take 1 capsule by mouth daily.    Marland Kitchen CRANBERRY PO Take 1 capsule by mouth daily. 4200mg     . ibuprofen (ADVIL,MOTRIN) 200 MG tablet Take 200 mg by mouth as needed.    Marland Kitchen LORazepam (ATIVAN) 0.5 MG tablet Take 1 tablet (0.5 mg total) by mouth daily as needed. 30 tablet 0  . losartan-hydrochlorothiazide  (HYZAAR) 50-12.5 MG tablet Take 0.5 tablets by mouth daily. 45 tablet 3  . Omega-3 Fatty Acids (FISH OIL) 1200 MG CAPS Take 1 capsule by mouth 2 (two) times daily.    Marland Kitchen omeprazole (PRILOSEC) 20 MG capsule TAKE 1 CAPSULE BY MOUTH TWICE A DAY 180 capsule 0  . phenylephrine (SUDAFED PE) 10 MG TABS tablet Take 10 mg by mouth 3 times/day as needed-between meals & bedtime.    . simvastatin (ZOCOR) 20 MG tablet TAKE 1 TABLET BY MOUTH EVERY DAY AT 6PM 90 tablet 0  . terbinafine (LAMISIL) 250 MG tablet TAKE 1 TABLET BY MOUTH EVERY DAY 30 tablet 1  . vitamin B-12 (CYANOCOBALAMIN) 1000 MCG tablet Take 1,000 mcg by mouth daily.     No current facility-administered medications for this visit.    Allergies  Allergen Reactions  . Adhesive [Tape] Rash    redness    Family History  Problem Relation Age of Onset  . Cancer Mother        lung  . Hypertension Sister   . Breast cancer Paternal Aunt     Social History   Socioeconomic History  . Marital status: Widowed    Spouse name: Not on file  . Number of children: Not on file  . Years of education: Not on file  . Highest education level:  Not on file  Occupational History  . Not on file  Tobacco Use  . Smoking status: Never Smoker  . Smokeless tobacco: Never Used  Substance and Sexual Activity  . Alcohol use: No  . Drug use: No  . Sexual activity: Never  Other Topics Concern  . Not on file  Social History Narrative   ** Merged History Encounter **       Social Determinants of Health   Financial Resource Strain:   . Difficulty of Paying Living Expenses: Not on file  Food Insecurity:   . Worried About Charity fundraiser in the Last Year: Not on file  . Ran Out of Food in the Last Year: Not on file  Transportation Needs:   . Lack of Transportation (Medical): Not on file  . Lack of Transportation (Non-Medical): Not on file  Physical Activity:   . Days of Exercise per Week: Not on file  . Minutes of Exercise per Session: Not on  file  Stress:   . Feeling of Stress : Not on file  Social Connections:   . Frequency of Communication with Friends and Family: Not on file  . Frequency of Social Gatherings with Friends and Family: Not on file  . Attends Religious Services: Not on file  . Active Member of Clubs or Organizations: Not on file  . Attends Archivist Meetings: Not on file  . Marital Status: Not on file  Intimate Partner Violence:   . Fear of Current or Ex-Partner: Not on file  . Emotionally Abused: Not on file  . Physically Abused: Not on file  . Sexually Abused: Not on file    Hospitiliaztions: None  Health Maintenance:    Flu: 09/2019  Tetanus: 12/2013  Pneumovax: 07/2015  Prevnar: 06/2014  Zostavax:06/2014  Shingrix: 2019  Covid: Pfizer  Mammogram: 12/2019  Pap Smear: no longer screening  Bone Density: 10/2019  Colon Screening: never  Eye Doctor: annually  Dental Exam: biannually   Providers:   PCP: [name], NP     I have personally reviewed and have noted:  1. The patient's medical and social history 2. Their use of alcohol, tobacco or illicit drugs 3. Their current medications and supplements 4. The patient's functional ability including ADL's, fall risks, home safety risks and hearing or visual impairment. 5. Diet and physical activities 6. Evidence for depression or mood disorder  Subjective:   Review of Systems:   Constitutional: Denies fever, malaise, fatigue, headache or abrupt weight changes.  HEENT: Denies eye pain, eye redness, ear pain, ringing in the ears, wax buildup, runny nose, nasal congestion, bloody nose, or sore throat. Respiratory: Denies difficulty breathing, shortness of breath, cough or sputum production.   Cardiovascular: Denies chest pain, chest tightness, palpitations or swelling in the hands or feet.  Gastrointestinal: Denies abdominal pain, bloating, constipation, diarrhea or blood in the stool.  GU: Denies urgency, frequency, pain with  urination, burning sensation, blood in urine, odor or discharge. Musculoskeletal: Pt reports intermittent joint pain. Denies decrease in range of motion, difficulty with gait, muscle pain or joint swelling.  Skin: Denies redness, rashes, lesions or ulcercations.  Neurological: Denies dizziness, difficulty with memory, difficulty with speech or problems with balance and coordination.  Psych: Pt reports anxiety. Denies depression, SI/HI.  No other specific complaints in a complete review of systems (except as listed in HPI above).  Objective:  PE:   BP (!) 180/100   Pulse 70   Ht 5' 0.5" (1.537 m)  Wt 171 lb (77.6 kg)   SpO2 99%   BMI 32.85 kg/m  Wt Readings from Last 3 Encounters:  08/08/20 171 lb (77.6 kg)  08/24/19 173 lb (78.5 kg)  08/07/19 174 lb (78.9 kg)    General: Appears her stated age, obese, in NAD. Skin: Warm, dry and intact. No rashes, lesions or ulcerations noted. HEENT: Head: normal shape and size; Eyes: sclera white, no icterus, conjunctiva pink, PERRLA and EOMs intact;  Neck: Neck supple, trachea midline. No masses, lumps or thyromegaly present.  Cardiovascular: Normal rate and rhythm. S1,S2 noted.  No murmur, rubs or gallops noted. No JVD or BLE edema. No carotid bruits noted. Pulmonary/Chest: Normal effort and positive vesicular breath sounds. No respiratory distress. No wheezes, rales or ronchi noted.  Abdomen: Soft and nontender. Normal bowel sounds. No distention or masses noted. Liver, spleen and kidneys non palpable. Musculoskeletal:  Strength 5/5 BUE/BLE. No signs of joint swelling.  Neurological: Alert and oriented. Cranial nerves II-XII grossly intact. Coordination normal.  Psychiatric: Mood and affect normal. Behavior is normal. Judgment and thought content normal.   BMET    Component Value Date/Time   NA 133 (L) 09/25/2019 0849   NA 130 (L) 04/12/2015 0501   K 4.9 09/25/2019 0849   K 4.3 04/12/2015 0501   CL 98 09/25/2019 0849   CL 98 (L)  04/12/2015 0501   CO2 28 09/25/2019 0849   CO2 25 04/12/2015 0501   GLUCOSE 86 09/25/2019 0849   GLUCOSE 102 (H) 04/12/2015 0501   BUN 17 09/25/2019 0849   BUN 14 04/12/2015 0501   CREATININE 0.97 09/25/2019 0849   CREATININE 0.79 04/12/2015 0501   CALCIUM 9.4 09/25/2019 0849   CALCIUM 8.0 (L) 04/12/2015 0501   GFRNONAA >60 04/12/2015 0501   GFRAA >60 04/12/2015 0501    Lipid Panel     Component Value Date/Time   CHOL 196 08/07/2019 0901   TRIG 199.0 (H) 08/07/2019 0901   HDL 48.80 08/07/2019 0901   CHOLHDL 4 08/07/2019 0901   VLDL 39.8 08/07/2019 0901   LDLCALC 108 (H) 08/07/2019 0901    CBC    Component Value Date/Time   WBC 7.2 08/07/2019 0901   RBC 3.81 (L) 08/07/2019 0901   HGB 12.7 08/07/2019 0901   HGB 9.8 (L) 04/13/2015 0444   HCT 37.2 08/07/2019 0901   HCT 37.5 03/27/2015 1437   PLT 210.0 08/07/2019 0901   PLT 178 04/12/2015 0501   MCV 97.7 08/07/2019 0901   MCV 97 03/27/2015 1437   MCH 32.8 03/27/2015 1437   MCHC 34.2 08/07/2019 0901   RDW 12.9 08/07/2019 0901   RDW 12.7 03/27/2015 1437    Hgb A1C Lab Results  Component Value Date   HGBA1C 5.8 03/12/2016      Assessment and Plan:   Medicare Annual Wellness Visit:  Diet: She does eat meat. She eats more veggies than fruits . She tries to avoid fried foods. She drinsk mostly coffee, water, Sprite. Physical activity: None Depression/mood screen: Negative, PHQ 9 score of 0 Hearing: Intact to whispered voice Visual acuity: Grossly normal, performs annual eye exam  ADLs: Capable Fall risk: None Home safety: Good Cognitive evaluation: Intact to orientation, naming, recall and repetition EOL planning: Adv directives, full code/ I agree  Preventative Medicine: Encouraged her to get a flu shot in the fall. Tetanus, pneumovax, prevnar, zostovax, shingrix and covid UTD. Mammogram, bone density UTD. She no longer wants to screen for cervical cancer. Cologuard ordered. Encouraged her to consume  a  balanced diet and exercise regimen. Advised her to see an eye doctor and dentist annually. Will check CBC, CMET, Lipid and Vit D today. Due dates for screening exam given to patient as part of her AVS.  Toenail Fungus:  Will not refill Terbinafine She will schedule appt with podiatry  Next appointment: 1 year, Medicare Wellness Exam [name], NP This visit occurred during the SARS-CoV-2 public health emergency.  Safety protocols were in place, including screening questions prior to the visit, additional usage of staff PPE, and extensive cleaning of exam room while observing appropriate contact time as indicated for disinfecting solutions.      [name], NP

## 2020-08-08 NOTE — Assessment & Plan Note (Signed)
Ativan refilled today Will need to update CSA and UDS

## 2020-08-08 NOTE — Assessment & Plan Note (Signed)
CMET and Lipid profile today Encouraged her to consume a low fat diet Continue Simvastatin 

## 2020-08-08 NOTE — Assessment & Plan Note (Signed)
Continue Ibuprofen as needed. 

## 2020-08-08 NOTE — Assessment & Plan Note (Signed)
Avoid foods that trigger reflux Continue Omeprazole CBC and CMET today

## 2020-08-08 NOTE — Assessment & Plan Note (Signed)
Advised her to take Losartan HCT and Atenolol together at 7 am.  Advised her to check her BP daily in the afternoon CMET today Reinforced DASH diet and exercise for weight loss Update me in 1 week with BP readings

## 2020-08-08 NOTE — Patient Instructions (Signed)

## 2020-08-15 ENCOUNTER — Telehealth: Payer: Self-pay | Admitting: Internal Medicine

## 2020-08-15 NOTE — Telephone Encounter (Signed)
Patient came into office and dropped off BP readings as followed: 08/08/2020: 184/96 08/09/2020 at 2:00: 158/53, 71 pulse 08/10/2020 at 7:00pm: 164/68, 72 pulse 08/11/2020: Not taken 8/30 at 9:50am: 169/78, 69 pulse 08/12/2020 at 10:40am: 166/73, 67 pulse 08/13/2020: 168/77, 73 pulse. Patient has taken both BP medications at 8:00am each morning. Please advise and call patient back at [phone], with instructions of what to do now.

## 2020-08-15 NOTE — Telephone Encounter (Signed)
Increase Losartan to 1 tab daily in addition to Atenolol. Update me in 1 week with BP readings.

## 2020-08-16 NOTE — Telephone Encounter (Signed)
Left message on voicemail.

## 2020-08-23 NOTE — Telephone Encounter (Signed)
Patient came into office and dropped off BP readings as followed: 08/17/2020 at 10:00: 180/75 pulse 71 08/18/2020 at 9:55: 176/83 pulse 75 08/19/2020 at 10:05: meds at 7:50. 157/78 pulse 65. 08/20/2020: meds at 7:45 180/93 pulse 75. 163/82 pulse 75 at 5:20pm 08/21/2020 at 9:45: meds 7:45: 194/89 pulse 67. 08/22/2020: meds at 7:45 08/23/2020: meds at 7:45.  Patient stated she has been having such anxiety over the BP readings she could not take it these past few days.

## 2020-08-25 ENCOUNTER — Other Ambulatory Visit: Payer: Self-pay | Admitting: Internal Medicine

## 2020-08-28 NOTE — Telephone Encounter (Signed)
Recommend taking 2 losartan HCT. Have her come follow up with me in 1 week

## 2020-08-30 NOTE — Telephone Encounter (Signed)
Pt is aware, 1 week BP f/u appt scheduled

## 2020-08-30 NOTE — Addendum Note (Signed)
Addended by: Lurlean Nanny on: 08/30/2020 10:41 AM   Modules accepted: Orders

## 2020-09-05 ENCOUNTER — Other Ambulatory Visit: Payer: Self-pay

## 2020-09-05 ENCOUNTER — Encounter: Payer: Self-pay | Admitting: Internal Medicine

## 2020-09-05 ENCOUNTER — Ambulatory Visit (INDEPENDENT_AMBULATORY_CARE_PROVIDER_SITE_OTHER): Payer: Medicare Other | Admitting: Internal Medicine

## 2020-09-05 VITALS — BP 170/86 | HR 63 | Temp 97.9°F | Wt 170.0 lb

## 2020-09-05 DIAGNOSIS — I10 Essential (primary) hypertension: Secondary | ICD-10-CM

## 2020-09-05 DIAGNOSIS — F411 Generalized anxiety disorder: Secondary | ICD-10-CM | POA: Diagnosis not present

## 2020-09-05 DIAGNOSIS — Z23 Encounter for immunization: Secondary | ICD-10-CM | POA: Diagnosis not present

## 2020-09-05 LAB — BASIC METABOLIC PANEL
BUN: 18 mg/dL (ref 6–23)
CO2: 28 mEq/L (ref 19–32)
Calcium: 9.6 mg/dL (ref 8.4–10.5)
Chloride: 92 mEq/L — ABNORMAL LOW (ref 96–112)
Creatinine, Ser: 0.89 mg/dL (ref 0.40–1.20)
GFR: 61.89 mL/min (ref >60.00)
Glucose, Bld: 106 mg/dL — ABNORMAL HIGH (ref 70–99)
Potassium: 4.5 mEq/L (ref 3.5–5.1)
Sodium: 127 mEq/L — ABNORMAL LOW (ref 135–145)

## 2020-09-05 MED ORDER — LOSARTAN POTASSIUM-HCTZ 100-25 MG PO TABS: 1.0000 | ORAL_TABLET | Freq: Every day | ORAL | 1 refills | Status: DC

## 2020-09-05 MED ORDER — VENLAFAXINE HCL ER 37.5 MG PO CP24: 37.5000 mg | ORAL_CAPSULE | Freq: Every day | ORAL | 2 refills | Status: DC

## 2020-09-05 NOTE — Progress Notes (Signed)
Subjective:    Patient ID: Kristen Powell, female    DOB: [birth year], 74 y.o.   MRN: [id number]  HPI  Pt presents to the clinic today for follow up of HTN. Her Losartan HCT was increased to 2 tabs daily in addition to her Atenolol. She has been taking the medication as prescribed. Her BP today is 170/86. She has not been checking her BP at home over the last week. There is no ECG on file.  She has been under a lot of stress lately and not sure if this is a contributing factor. She constantly worries about her kids, grandkids, and great grandkids. She reports her sister take Venlafaxine for anxiety and she wonders if this is something she can take. She does take Lorazepam on an as needed basis. She denies depression, SI/HI.  Review of Systems      Past Medical History:  Diagnosis Date  . Anxiety   . Hyperlipidemia   . Hypertension     Current Outpatient Medications  Medication Sig Dispense Refill  . acetaminophen (TYLENOL) 500 MG tablet Take 1-2 tablets (500-1,000 mg total) by mouth every 4 (four) hours as needed for fever. 30 tablet 0  . aspirin 81 MG tablet Take 81 mg by mouth daily.    Marland Kitchen atenolol (TENORMIN) 25 MG tablet TAKE 1 TABLET BY MOUTH EVERY DAY 90 tablet 0  . calcium carbonate (OS-CAL) 600 MG TABS tablet Take 600 mg by mouth daily.    . Cholecalciferol (VITAMIN D) 2000 UNITS CAPS Take 1 capsule by mouth daily.    Marland Kitchen CRANBERRY PO Take 1 capsule by mouth daily. 4200mg     . ibuprofen (ADVIL,MOTRIN) 200 MG tablet Take 200 mg by mouth as needed.    Marland Kitchen LORazepam (ATIVAN) 0.5 MG tablet Take 1 tablet (0.5 mg total) by mouth daily as needed. 30 tablet 0  . losartan-hydrochlorothiazide (HYZAAR) 50-12.5 MG tablet Take 2 tablets by mouth daily. 60 tablet 0  . Omega-3 Fatty Acids (FISH OIL) 1200 MG CAPS Take 1 capsule by mouth 2 (two) times daily.    Marland Kitchen omeprazole (PRILOSEC) 20 MG capsule TAKE 1 CAPSULE BY MOUTH TWICE A DAY 180 capsule 0  . phenylephrine (SUDAFED PE) 10 MG TABS  tablet Take 10 mg by mouth 3 times/day as needed-between meals & bedtime.    . simvastatin (ZOCOR) 20 MG tablet TAKE 1 TABLET BY MOUTH EVERY DAY AT 6PM 90 tablet 2  . vitamin B-12 (CYANOCOBALAMIN) 1000 MCG tablet Take 1,000 mcg by mouth daily.     No current facility-administered medications for this visit.    Allergies  Allergen Reactions  . Adhesive [Tape] Rash    redness    Family History  Problem Relation Age of Onset  . Cancer Mother        lung  . Hypertension Sister   . Breast cancer Paternal Aunt     Social History   Socioeconomic History  . Marital status: Widowed    Spouse name: Not on file  . Number of children: Not on file  . Years of education: Not on file  . Highest education level: Not on file  Occupational History  . Not on file  Tobacco Use  . Smoking status: Never Smoker  . Smokeless tobacco: Never Used  Substance and Sexual Activity  . Alcohol use: No  . Drug use: No  . Sexual activity: Never  Other Topics Concern  . Not on file  Social History Narrative   ** Merged  History Encounter **       Social Determinants of Health   Financial Resource Strain:   . Difficulty of Paying Living Expenses: Not on file  Food Insecurity:   . Worried About Charity fundraiser in the Last Year: Not on file  . Ran Out of Food in the Last Year: Not on file  Transportation Needs:   . Lack of Transportation (Medical): Not on file  . Lack of Transportation (Non-Medical): Not on file  Physical Activity:   . Days of Exercise per Week: Not on file  . Minutes of Exercise per Session: Not on file  Stress:   . Feeling of Stress : Not on file  Social Connections:   . Frequency of Communication with Friends and Family: Not on file  . Frequency of Social Gatherings with Friends and Family: Not on file  . Attends Religious Services: Not on file  . Active Member of Clubs or Organizations: Not on file  . Attends Archivist Meetings: Not on file  . Marital  Status: Not on file  Intimate Partner Violence:   . Fear of Current or Ex-Partner: Not on file  . Emotionally Abused: Not on file  . Physically Abused: Not on file  . Sexually Abused: Not on file     Constitutional: Denies fever, malaise, fatigue, headache or abrupt weight changes.  Respiratory: Denies difficulty breathing, shortness of breath, cough or sputum production.   Cardiovascular: Denies chest pain, chest tightness, palpitations or swelling in the hands or feet.  Neurological: Denies dizziness, difficulty with memory, difficulty with speech or problems with balance and coordination.  Psych: Pt reports anxiety. Denies depression, SI/HI.  No other specific complaints in a complete review of systems (except as listed in HPI above).  Objective:   Physical Exam  BP (!) 170/86   Pulse 63   Temp 97.9 F (36.6 C) (Temporal)   Wt 170 lb (77.1 kg)   SpO2 100%   BMI 32.65 kg/m   Wt Readings from Last 3 Encounters:  08/08/20 171 lb (77.6 kg)  08/24/19 173 lb (78.5 kg)  08/07/19 174 lb (78.9 kg)    General: Appears her stated age, obese, in NAD. HEENT: Head: normal shape and size; Eyes: EOMs intact;   Cardiovascular: Normal rate and rhythm. S1,S2 noted.  No murmur, rubs or gallops noted. 1+ BLE edema.  Pulmonary/Chest: Normal effort and positive vesicular breath sounds. No respiratory distress. No wheezes, rales or ronchi noted.  Musculoskeletal: No difficulty with gait. Neurological: Alert and oriented. Coordination normal.  Psychiatric: Mildly anxious appearing. Behavior is normal. Judgment and thought content normal.     BMET    Component Value Date/Time   NA 131 (L) 08/08/2020 0909   NA 130 (L) 04/12/2015 0501   K 4.9 08/08/2020 0909   K 4.3 04/12/2015 0501   CL 97 08/08/2020 0909   CL 98 (L) 04/12/2015 0501   CO2 28 08/08/2020 0909   CO2 25 04/12/2015 0501   GLUCOSE 91 08/08/2020 0909   GLUCOSE 102 (H) 04/12/2015 0501   BUN 14 08/08/2020 0909   BUN 14  04/12/2015 0501   CREATININE 0.91 08/08/2020 0909   CREATININE 0.79 04/12/2015 0501   CALCIUM 9.3 08/08/2020 0909   CALCIUM 8.0 (L) 04/12/2015 0501   GFRNONAA >60 04/12/2015 0501   GFRAA >60 04/12/2015 0501    Lipid Panel     Component Value Date/Time   CHOL 167 08/08/2020 0909   TRIG 156.0 (H) 08/08/2020 6073  HDL 47.70 08/08/2020 0909   CHOLHDL 4 08/08/2020 0909   VLDL 31.2 08/08/2020 0909   LDLCALC 88 08/08/2020 0909    CBC    Component Value Date/Time   WBC 6.9 08/08/2020 0909   RBC 3.68 (L) 08/08/2020 0909   HGB 12.3 08/08/2020 0909   HGB 9.8 (L) 04/13/2015 0444   HCT 35.8 (L) 08/08/2020 0909   HCT 37.5 03/27/2015 1437   PLT 204.0 08/08/2020 0909   PLT 178 04/12/2015 0501   MCV 97.2 08/08/2020 0909   MCV 97 03/27/2015 1437   MCH 32.8 03/27/2015 1437   MCHC 34.2 08/08/2020 0909   RDW 12.7 08/08/2020 0909   RDW 12.7 03/27/2015 1437    Hgb A1C Lab Results  Component Value Date   HGBA1C 5.8 03/12/2016           Assessment & Plan:    Kristen Silversmith, NP This visit occurred during the SARS-CoV-2 public health emergency.  Safety protocols were in place, including screening questions prior to the visit, additional usage of staff PPE, and extensive cleaning of exam room while observing appropriate contact time as indicated for disinfecting solutions.

## 2020-09-05 NOTE — Assessment & Plan Note (Signed)
Still elevated despite increasing medication Concerned there is a component of anxiety/white coat HTN- will start Venlafaxine 37.5 mg PO daily Continue Losartan HCT 100-25 mg daily, refilled today Continue Atenolol 25 mg daily Reinforced DASH diet, exercise for weight loss BMET today Advised her to check her BP 2 times weekly until her next follow up appt

## 2020-09-05 NOTE — Assessment & Plan Note (Signed)
Persistent Will trial Venlafaxine 37.5 mg PO daily Continue Lorazepam as needed  RTC in 1 month for follow up HTN/anxiety

## 2020-09-05 NOTE — Patient Instructions (Signed)

## 2020-09-09 ENCOUNTER — Telehealth: Payer: Self-pay

## 2020-09-09 MED ORDER — LOSARTAN POTASSIUM 100 MG PO TABS: 100.0000 mg | ORAL_TABLET | Freq: Every day | ORAL | 2 refills | Status: DC

## 2020-09-09 NOTE — Addendum Note (Signed)
Addended by: Lurlean Nanny on: 09/09/2020 03:50 PM   Modules accepted: Orders

## 2020-09-09 NOTE — Telephone Encounter (Signed)
[facility] Night - Client Nonclinical Telephone Record  [facility] Client [facility] Night - Client Client Site [place] - Night Contact Type Call Who Is Calling Patient / Member / Family / Caregiver Caller Name [place] Phone Number [phone] Call Type Message Only Information Provided Reason for Call Returning a Call from the Office Initial [place] reports that she is returning a call from office Additional Comment Caller given office hours for follow up Disp. Time Disposition Final User 09/06/2020 5:22:08 PM General Information Provided Yes Elvera Maria Call Closed By: Elvera Maria Transaction Date/Time: 09/06/2020 5:20:46 PM (ET)

## 2020-09-27 ENCOUNTER — Other Ambulatory Visit: Payer: Self-pay | Admitting: Internal Medicine

## 2020-09-27 DIAGNOSIS — F411 Generalized anxiety disorder: Secondary | ICD-10-CM

## 2020-10-15 ENCOUNTER — Other Ambulatory Visit: Payer: Self-pay

## 2020-10-15 ENCOUNTER — Telehealth: Payer: Self-pay | Admitting: Internal Medicine

## 2020-10-15 ENCOUNTER — Encounter: Payer: Self-pay | Admitting: Internal Medicine

## 2020-10-15 ENCOUNTER — Ambulatory Visit (INDEPENDENT_AMBULATORY_CARE_PROVIDER_SITE_OTHER): Payer: Medicare Other | Admitting: Internal Medicine

## 2020-10-15 VITALS — BP 166/88 | HR 82 | Temp 96.9°F | Wt 166.0 lb

## 2020-10-15 DIAGNOSIS — I10 Essential (primary) hypertension: Secondary | ICD-10-CM | POA: Diagnosis not present

## 2020-10-15 DIAGNOSIS — F419 Anxiety disorder, unspecified: Secondary | ICD-10-CM

## 2020-10-15 DIAGNOSIS — E871 Hypo-osmolality and hyponatremia: Secondary | ICD-10-CM | POA: Diagnosis not present

## 2020-10-15 DIAGNOSIS — F411 Generalized anxiety disorder: Secondary | ICD-10-CM | POA: Diagnosis not present

## 2020-10-15 LAB — BASIC METABOLIC PANEL
BUN: 9 mg/dL (ref 6–23)
CO2: 27 mEq/L (ref 19–32)
Calcium: 9.2 mg/dL (ref 8.4–10.5)
Chloride: 97 mEq/L (ref 96–112)
Creatinine, Ser: 0.86 mg/dL (ref 0.40–1.20)
GFR: 66.4 mL/min (ref >60.00)
Glucose, Bld: 98 mg/dL (ref 70–99)
Potassium: 4.7 mEq/L (ref 3.5–5.1)
Sodium: 131 mEq/L — ABNORMAL LOW (ref 135–145)

## 2020-10-15 MED ORDER — AMLODIPINE BESYLATE 5 MG PO TABS: 5.0000 mg | ORAL_TABLET | Freq: Every day | ORAL | 3 refills | Status: DC

## 2020-10-15 NOTE — Assessment & Plan Note (Signed)
Improved with Venlafazine, will continue at this time If sodium remains low, will need to consider stopping this Support offered

## 2020-10-15 NOTE — Progress Notes (Signed)
Subjective:    Patient ID: Kristen Powell, female    DOB: Aug 27, 1946, 74 y.o.   MRN: [id number]  HPI  Pt presents to the clinic today for 1 month follow up of HTN/anxiety. At her last visit, she was started on Venlafaxine in addition to her Losartan and Atenolol. Her HCTZ was stopped due to hyponatremia. I felt there was a component of anxiety that was contributing to white coat HTN. She has been taking the medication as prescribed and denies adverse side effects. She has been checking her BP at home and it has been running 158/90's. Her BP today is 166/88. She does feel like the Venlafaxine has helped with her anxiety.  Review of Systems      Past Medical History:  Diagnosis Date  . Anxiety   . Hyperlipidemia   . Hypertension     Current Outpatient Medications  Medication Sig Dispense Refill  . acetaminophen (TYLENOL) 500 MG tablet Take 1-2 tablets (500-1,000 mg total) by mouth every 4 (four) hours as needed for fever. 30 tablet 0  . aspirin 81 MG tablet Take 81 mg by mouth daily.    Marland Kitchen atenolol (TENORMIN) 25 MG tablet TAKE 1 TABLET BY MOUTH EVERY DAY 90 tablet 0  . calcium carbonate (OS-CAL) 600 MG TABS tablet Take 600 mg by mouth daily.    . Cholecalciferol (VITAMIN D) 2000 UNITS CAPS Take 1 capsule by mouth daily.    Marland Kitchen CRANBERRY PO Take 1 capsule by mouth daily. 4200mg     . ibuprofen (ADVIL,MOTRIN) 200 MG tablet Take 200 mg by mouth as needed.    Marland Kitchen LORazepam (ATIVAN) 0.5 MG tablet Take 1 tablet (0.5 mg total) by mouth daily as needed. 30 tablet 0  . losartan (COZAAR) 100 MG tablet Take 1 tablet (100 mg total) by mouth daily. 30 tablet 2  . Omega-3 Fatty Acids (FISH OIL) 1200 MG CAPS Take 1 capsule by mouth 2 (two) times daily.    Marland Kitchen omeprazole (PRILOSEC) 20 MG capsule TAKE 1 CAPSULE BY MOUTH TWICE A DAY 180 capsule 0  . phenylephrine (SUDAFED PE) 10 MG TABS tablet Take 10 mg by mouth 3 times/day as needed-between meals & bedtime.    . simvastatin (ZOCOR) 20 MG tablet TAKE 1  TABLET BY MOUTH EVERY DAY AT 6PM 90 tablet 2  . venlafaxine XR (EFFEXOR-XR) 37.5 MG 24 hr capsule TAKE 1 CAPSULE BY MOUTH DAILY WITH BREAKFAST. 90 capsule 0  . vitamin B-12 (CYANOCOBALAMIN) 1000 MCG tablet Take 1,000 mcg by mouth daily.     No current facility-administered medications for this visit.    Allergies  Allergen Reactions  . Adhesive [Tape] Rash    redness    Family History  Problem Relation Age of Onset  . Cancer Mother        lung  . Hypertension Sister   . Breast cancer Paternal Aunt     Social History   Socioeconomic History  . Marital status: Widowed    Spouse name: Not on file  . Number of children: Not on file  . Years of education: Not on file  . Highest education level: Not on file  Occupational History  . Not on file  Tobacco Use  . Smoking status: Never Smoker  . Smokeless tobacco: Never Used  Substance and Sexual Activity  . Alcohol use: No  . Drug use: No  . Sexual activity: Never  Other Topics Concern  . Not on file  Social History Narrative   **  Merged History Encounter **       Social Determinants of Health   Financial Resource Strain:   . Difficulty of Paying Living Expenses: Not on file  Food Insecurity:   . Worried About Charity fundraiser in the Last Year: Not on file  . Ran Out of Food in the Last Year: Not on file  Transportation Needs:   . Lack of Transportation (Medical): Not on file  . Lack of Transportation (Non-Medical): Not on file  Physical Activity:   . Days of Exercise per Week: Not on file  . Minutes of Exercise per Session: Not on file  Stress:   . Feeling of Stress : Not on file  Social Connections:   . Frequency of Communication with Friends and Family: Not on file  . Frequency of Social Gatherings with Friends and Family: Not on file  . Attends Religious Services: Not on file  . Active Member of Clubs or Organizations: Not on file  . Attends Archivist Meetings: Not on file  . Marital Status:  Not on file  Intimate Partner Violence:   . Fear of Current or Ex-Partner: Not on file  . Emotionally Abused: Not on file  . Physically Abused: Not on file  . Sexually Abused: Not on file     Constitutional: Denies fever, malaise, fatigue, headache or abrupt weight changes.  Respiratory: Denies difficulty breathing, shortness of breath, cough or sputum production.   Cardiovascular: Denies chest pain, chest tightness, palpitations or swelling in the hands or feet.  Neurological: Denies dizziness, difficulty with memory, difficulty with speech or problems with balance and coordination.  Psych: Pt has a history of anxiety. Denies depression, SI/HI.  No other specific complaints in a complete review of systems (except as listed in HPI above).  Objective:   Physical Exam   BP (!) 166/88   Pulse 82   Temp (!) 96.9 F (36.1 C) (Temporal)   Wt 166 lb (75.3 kg)   SpO2 98%   BMI 31.89 kg/m   Wt Readings from Last 3 Encounters:  09/05/20 170 lb (77.1 kg)  08/08/20 171 lb (77.6 kg)  08/24/19 173 lb (78.5 kg)    General: Appears her stated age, obese, in NAD. Skin: Warm, dry and intact. No rashes, lesions or ulcerations noted. HEENT: Head: normal shape and size; Eyes: sclera white, no icterus, conjunctiva pink, PERRLA and EOMs intact;  Cardiovascular: Normal rate and rhythm. S1,S2 noted.  No murmur, rubs or gallops noted. No appreciable BLE edema. Pulmonary/Chest: Normal effort and positive vesicular breath sounds. No respiratory distress. No wheezes, rales or ronchi noted.  Neurological: Alert and oriented.  Psychiatric: Mood and affect normal. Behavior is normal. Judgment and thought content normal.    BMET    Component Value Date/Time   NA 127 (L) 09/05/2020 0901   NA 130 (L) 04/12/2015 0501   K 4.5 09/05/2020 0901   K 4.3 04/12/2015 0501   CL 92 (L) 09/05/2020 0901   CL 98 (L) 04/12/2015 0501   CO2 28 09/05/2020 0901   CO2 25 04/12/2015 0501   GLUCOSE 106 (H) 09/05/2020  0901   GLUCOSE 102 (H) 04/12/2015 0501   BUN 18 09/05/2020 0901   BUN 14 04/12/2015 0501   CREATININE 0.89 09/05/2020 0901   CREATININE 0.79 04/12/2015 0501   CALCIUM 9.6 09/05/2020 0901   CALCIUM 8.0 (L) 04/12/2015 0501   GFRNONAA >60 04/12/2015 0501   GFRAA >60 04/12/2015 0501    Lipid Panel  Component Value Date/Time   CHOL 167 08/08/2020 0909   TRIG 156.0 (H) 08/08/2020 0909   HDL 47.70 08/08/2020 0909   CHOLHDL 4 08/08/2020 0909   VLDL 31.2 08/08/2020 0909   LDLCALC 88 08/08/2020 0909    CBC    Component Value Date/Time   WBC 6.9 08/08/2020 0909   RBC 3.68 (L) 08/08/2020 0909   HGB 12.3 08/08/2020 0909   HGB 9.8 (L) 04/13/2015 0444   HCT 35.8 (L) 08/08/2020 0909   HCT 37.5 03/27/2015 1437   PLT 204.0 08/08/2020 0909   PLT 178 04/12/2015 0501   MCV 97.2 08/08/2020 0909   MCV 97 03/27/2015 1437   MCH 32.8 03/27/2015 1437   MCHC 34.2 08/08/2020 0909   RDW 12.7 08/08/2020 0909   RDW 12.7 03/27/2015 1437    Hgb A1C Lab Results  Component Value Date   HGBA1C 5.8 03/12/2016           Assessment & Plan:   Hyponatremia:  BMET today  Check your BP 2 x weekly, update me at the end of the week  Return as needed  [name], NP This visit occurred during the SARS-CoV-2 public health emergency.  Safety protocols were in place, including screening questions prior to the visit, additional usage of staff PPE, and extensive cleaning of exam room while observing appropriate contact time as indicated for disinfecting solutions.

## 2020-10-15 NOTE — Telephone Encounter (Signed)
Pt called wanting to get her labs result Pt cannot get on mychart

## 2020-10-15 NOTE — Patient Instructions (Signed)
The Venlafaxine has helped your anxiety but not your blood pressure, but I want you to continue to take this.  I am adding Amlodipine 5 mg, take this with your Losartan and continue your Atenolol at bedtime.  We are rechecking your kidney function and your sodium today- I will let you know about these results.  Consider getting your 3rd Covid booster  Check you BP 2 x week and call me with these readings at the end of the week.

## 2020-10-15 NOTE — Telephone Encounter (Signed)
Spoke to pt about her labs

## 2020-10-15 NOTE — Assessment & Plan Note (Signed)
Still uncontrolled on Losartan and Atenolol Will add Amlodipine 5 mg daily, RX sent to pharmacy Reinforced [place] BMET today  Update me at the end of the week with your BP readings

## 2020-10-16 ENCOUNTER — Other Ambulatory Visit: Payer: Self-pay | Admitting: Internal Medicine

## 2020-10-16 DIAGNOSIS — I10 Essential (primary) hypertension: Secondary | ICD-10-CM

## 2020-10-18 ENCOUNTER — Ambulatory Visit (INDEPENDENT_AMBULATORY_CARE_PROVIDER_SITE_OTHER): Payer: Medicare Other | Admitting: Podiatry

## 2020-10-18 ENCOUNTER — Encounter: Payer: Self-pay | Admitting: Podiatry

## 2020-10-18 ENCOUNTER — Other Ambulatory Visit: Payer: Self-pay

## 2020-10-18 DIAGNOSIS — M79674 Pain in right toe(s): Secondary | ICD-10-CM | POA: Diagnosis not present

## 2020-10-18 DIAGNOSIS — L603 Nail dystrophy: Secondary | ICD-10-CM | POA: Diagnosis not present

## 2020-10-18 DIAGNOSIS — B351 Tinea unguium: Secondary | ICD-10-CM | POA: Diagnosis not present

## 2020-10-18 MED ORDER — GENTAMICIN SULFATE 0.1 % EX CREA: 1.0000 "application " | TOPICAL_CREAM | Freq: Two times a day (BID) | CUTANEOUS | 1 refills | Status: DC

## 2020-10-18 NOTE — Progress Notes (Signed)
   Subjective: Patient presents today for evaluation of pain to the right second digit that has been ongoing for several months.  Patient states that she was referred here by her PCP to have the toenail possibly removed.  The right second toe is her only symptomatic toe.  She has also tried oral antifungal medication with no success.  She states that the toe sticks straight up and rubs in her shoes.  Patient presents today for further treatment and evaluation.  Past Medical History:  Diagnosis Date  . Anxiety   . Hyperlipidemia   . Hypertension     Objective:  General: Well developed, nourished, in no acute distress, alert and oriented x3   Dermatology: Skin is warm, dry and supple bilateral.  Pincer nail deformity noted to the right second toe that is thickened, and dystrophic.  Pain on palpation noted to the border of the nail plate. The remaining nails appear unremarkable at this time. There are no open sores, lesions.  Vascular: Dorsalis Pedis artery and Posterior Tibial artery pedal pulses palpable. No lower extremity edema noted.   Neruologic: Grossly intact via light touch bilateral.  Musculoskeletal: Muscular strength within normal limits in all groups bilateral. Normal range of motion noted to all pedal and ankle joints.   Assesement: #1  Dystrophic nail right second digit #2 pain due to onychomycotic nail right second digit  Plan of Care:  1. Patient evaluated.  2. Discussed treatment alternatives and plan of care. Explained nail avulsion procedure and post procedure course to patient. 3. Patient opted for permanent total nail avulsion of the right second digit.  4. Prior to procedure, local anesthesia infiltration utilized using 3 ml of a 50:50 mixture of 2% plain lidocaine and 0.5% plain marcaine in a normal hallux block fashion and a betadine prep performed.  5.  Total permanent nail avulsion with chemical matrixectomy performed using [id number] applications of phenol  followed by alcohol flush.  6. Light dressing applied. 7.  Prescription for gentamicin cream applied daily  8.  Return to clinic 2 weeks.  Edrick Kins, DPM [facility]  Dr. Edrick Kins, [address]                                        [place], [place] [address]                Office [phone]  Fax [phone]

## 2020-10-18 NOTE — Patient Instructions (Signed)

## 2020-10-22 ENCOUNTER — Other Ambulatory Visit: Payer: Self-pay | Admitting: Internal Medicine

## 2020-10-22 DIAGNOSIS — K219 Gastro-esophageal reflux disease without esophagitis: Secondary | ICD-10-CM

## 2020-10-24 ENCOUNTER — Telehealth: Payer: Self-pay | Admitting: Internal Medicine

## 2020-10-24 NOTE — Telephone Encounter (Signed)
Pt called in wanted to give 2 blood pressure reading   164/71-  Pulse 71 DATE :11/10  168/79 - DUE TO CLEANING  DATE 11/11 PULSE :68

## 2020-10-25 NOTE — Telephone Encounter (Signed)
Still elevated, have her increase amlodipine to 2 5 mg tablets for total of 10 mg daily in addition to her other antihypertensive medications. Update me in 1 week

## 2020-10-28 NOTE — Telephone Encounter (Signed)
Pt is aware as instructed and expressed understanding 

## 2020-11-04 ENCOUNTER — Telehealth: Payer: Self-pay

## 2020-11-04 NOTE — Telephone Encounter (Signed)
New message   Patient at front desk with blood pressure reading   11.16.21 161/75 pulse 73  11.17.21 158/66 pulse 68  11.18.21  154/63 pulse 62  11.19.21 138/58 pulse 63  11.20.21 146/68 pulse 68 --- out of town guest   11.21.21 135/55 pulse 65  11.22.21 138/57 pulse 66

## 2020-11-04 NOTE — Telephone Encounter (Signed)
  11.16.21 161/75 pulse 73  11.17.21 158/66 pulse 68  11.18.21  154/63 pulse 62  11.19.21 138/58 pulse 63  11.20.21 146/68 pulse 68 --- out of town guest   11.21.21 135/55 pulse 65  11.22.21 138/57 pulse 66

## 2020-11-05 NOTE — Telephone Encounter (Signed)
Left message on voicemail.

## 2020-11-05 NOTE — Telephone Encounter (Signed)
Those are much better readings. Continue current regimen.

## 2020-11-12 ENCOUNTER — Ambulatory Visit (INDEPENDENT_AMBULATORY_CARE_PROVIDER_SITE_OTHER): Payer: Medicare Other | Admitting: Podiatry

## 2020-11-12 ENCOUNTER — Other Ambulatory Visit: Payer: Self-pay

## 2020-11-12 DIAGNOSIS — B351 Tinea unguium: Secondary | ICD-10-CM

## 2020-11-12 DIAGNOSIS — M79674 Pain in right toe(s): Secondary | ICD-10-CM

## 2020-11-12 DIAGNOSIS — L603 Nail dystrophy: Secondary | ICD-10-CM

## 2020-11-12 NOTE — Progress Notes (Signed)
   Subjective: 74 y.o. female presents today status post permanent nail avulsion procedure of the right second toe that was performed on 10/18/2020.  Patient states she is doing very well.  She has been soaking her feet and applying the antibiotic cream as instructed.  No new complaints at this time  Past Medical History:  Diagnosis Date  . Anxiety   . Hyperlipidemia   . Hypertension     Objective: Skin is warm, dry and supple.  Nail bed appears to be healing appropriately. Open wound to the associated nail fold with a granular wound base and moderate amount of fibrotic tissue. Minimal drainage noted. Mild erythema around the periungual region likely due to phenol chemical matricectomy.  Assessment: #1 postop permanent total nail avulsion right second toe #2 open wound periungual nail fold of respective digit.   Plan of care: #1 patient was evaluated  #2 debridement of open wound was performed to the periungual border of the respective toe using a currette. Antibiotic ointment and Band-Aid was applied. #3 patient is to return to clinic on a PRN basis.   Edrick Kins, DPM [facility]  Dr. Edrick Kins, [address]                                        [place], [place] [address]                Office [phone]  Fax [phone]

## 2020-11-14 ENCOUNTER — Other Ambulatory Visit: Payer: Self-pay | Admitting: Internal Medicine

## 2020-11-22 ENCOUNTER — Other Ambulatory Visit: Payer: Self-pay | Admitting: Internal Medicine

## 2020-11-22 DIAGNOSIS — I10 Essential (primary) hypertension: Secondary | ICD-10-CM

## 2020-11-25 ENCOUNTER — Other Ambulatory Visit: Payer: Self-pay | Admitting: Internal Medicine

## 2020-11-25 DIAGNOSIS — Z1231 Encounter for screening mammogram for malignant neoplasm of breast: Secondary | ICD-10-CM

## 2020-11-25 DIAGNOSIS — F411 Generalized anxiety disorder: Secondary | ICD-10-CM

## 2020-11-26 DIAGNOSIS — Z23 Encounter for immunization: Secondary | ICD-10-CM | POA: Diagnosis not present

## 2020-12-02 ENCOUNTER — Telehealth: Payer: Self-pay

## 2020-12-02 MED ORDER — AMLODIPINE BESYLATE 10 MG PO TABS
10.0000 mg | ORAL_TABLET | Freq: Every day | ORAL | 0 refills | Status: DC
Start: 2020-12-02 — End: 2021-02-25

## 2020-12-02 NOTE — Telephone Encounter (Signed)
Patient came into the office because she stated the nurse told her the new dosage to take of her medication the pharmacy is not fulfilling. I let patient know in the chart it looks as though the provider changed the dose or her medication. From 5 mg to 10 mg. She stated she was told to take 2 tablets a day. So all in all her insurance denied her the 10 mg of the medication and will accept it if its 5 mg two tablets a day and she is needing a Rx sent into the pharm,acy because she will be out of medication on Thursday 12/05/2020    Please call and advise

## 2020-12-02 NOTE — Telephone Encounter (Signed)
They should approve the change in dose. I had her take 2 5mg  to make sure the 10 mg dose was effective before sending this in. Can we call the pharmacy and clarify?

## 2020-12-02 NOTE — Addendum Note (Signed)
Addended by: Lurlean Nanny on: 12/02/2020 10:51 PM   Modules accepted: Orders

## 2020-12-23 DIAGNOSIS — L821 Other seborrheic keratosis: Secondary | ICD-10-CM | POA: Diagnosis not present

## 2020-12-23 DIAGNOSIS — L905 Scar conditions and fibrosis of skin: Secondary | ICD-10-CM | POA: Diagnosis not present

## 2020-12-23 DIAGNOSIS — D1801 Hemangioma of skin and subcutaneous tissue: Secondary | ICD-10-CM | POA: Diagnosis not present

## 2020-12-23 DIAGNOSIS — Z85828 Personal history of other malignant neoplasm of skin: Secondary | ICD-10-CM | POA: Diagnosis not present

## 2020-12-23 DIAGNOSIS — L814 Other melanin hyperpigmentation: Secondary | ICD-10-CM | POA: Diagnosis not present

## 2020-12-23 DIAGNOSIS — L853 Xerosis cutis: Secondary | ICD-10-CM | POA: Diagnosis not present

## 2021-01-06 ENCOUNTER — Other Ambulatory Visit: Payer: Self-pay

## 2021-01-06 ENCOUNTER — Ambulatory Visit
Admission: RE | Admit: 2021-01-06 | Discharge: 2021-01-06 | Disposition: A | Discharge status: Home / Self Care | Payer: Medicare Other | Source: Ambulatory Visit | Attending: Internal Medicine | Admitting: Internal Medicine

## 2021-01-06 DIAGNOSIS — Z1231 Encounter for screening mammogram for malignant neoplasm of breast: Secondary | ICD-10-CM

## 2021-02-24 ENCOUNTER — Other Ambulatory Visit: Payer: Self-pay | Admitting: Internal Medicine

## 2021-02-25 ENCOUNTER — Other Ambulatory Visit: Payer: Self-pay | Admitting: Internal Medicine

## 2021-02-26 NOTE — Telephone Encounter (Signed)
  LAST APPOINTMENT DATE: 02/24/2021   NEXT APPOINTMENT DATE:@Visit  date not found  MEDICATION: losartan 100mg - they are back order and wanted to know about getting a different brand due to the losartan is on national back order  PHARMACY: [facility]   Let patient know to contact pharmacy at the end of the day to make sure medication is ready.  Please notify patient to allow 48-72 hours to process  Encourage patient to contact the pharmacy for refills or they can request refills through [place]:   LAST REFILL:  QTY:  REFILL DATE:    OTHER COMMENTS:    Okay for refill?  Please advise

## 2021-03-21 ENCOUNTER — Other Ambulatory Visit: Payer: Self-pay | Admitting: Internal Medicine

## 2021-04-01 ENCOUNTER — Other Ambulatory Visit: Payer: Self-pay

## 2021-04-01 ENCOUNTER — Ambulatory Visit (INDEPENDENT_AMBULATORY_CARE_PROVIDER_SITE_OTHER): Payer: Medicare Other | Admitting: Podiatry

## 2021-04-01 DIAGNOSIS — G5791 Unspecified mononeuropathy of right lower limb: Secondary | ICD-10-CM | POA: Diagnosis not present

## 2021-04-01 DIAGNOSIS — R600 Localized edema: Secondary | ICD-10-CM | POA: Diagnosis not present

## 2021-04-01 DIAGNOSIS — M778 Other enthesopathies, not elsewhere classified: Secondary | ICD-10-CM

## 2021-04-01 MED ORDER — GABAPENTIN 100 MG PO CAPS: 100.0000 mg | ORAL_CAPSULE | Freq: Every day | ORAL | 1 refills | Status: DC

## 2021-04-02 DIAGNOSIS — R600 Localized edema: Secondary | ICD-10-CM | POA: Diagnosis not present

## 2021-04-02 DIAGNOSIS — G5791 Unspecified mononeuropathy of right lower limb: Secondary | ICD-10-CM | POA: Diagnosis not present

## 2021-04-02 DIAGNOSIS — M778 Other enthesopathies, not elsewhere classified: Secondary | ICD-10-CM | POA: Diagnosis not present

## 2021-04-02 MED ORDER — BETAMETHASONE SOD PHOS & ACET 6 (3-3) MG/ML IJ SUSP
3.0000 mg | Freq: Once | INTRAMUSCULAR | Status: AC
  Administered 2021-04-02: 3 mg via INTRA_ARTICULAR

## 2021-04-02 NOTE — Progress Notes (Signed)
   HPI: 75 y.o. female presenting today for new complaint of recurrent swelling to the right foot with numbness that is intermittent.  Patient states that at night times she can occasionally get numbness with tingling and sharp shooting sensations to the foot.  She presents for further treatment and evaluation  Past Medical History:  Diagnosis Date  . Anxiety   . Hyperlipidemia   . Hypertension      Physical Exam: General: The patient is alert and oriented x3 in no acute distress.  Dermatology: Skin is warm, dry and supple bilateral lower extremities. Negative for open lesions or macerations.  Vascular: Palpable pedal pulses bilaterally.  No erythema noted.  There is some diffuse edema noted to the right foot and leg.  Capillary refill within normal limits.  Neurological: Epicritic and protective threshold grossly intact bilaterally.  There is some paresthesia noted to the dorsal aspect of the foot  Musculoskeletal Exam: Range of motion within normal limits to all pedal and ankle joints bilateral. Muscle strength 5/5 in all groups bilateral.  Mild pain on palpation diffusely throughout the right foot midtarsal joints    Assessment: 1.  Capsulitis/DJD right foot 2.  Edema right foot 3.  Neuritis right foot   Plan of Care:  1. Patient evaluated.  2.  Compression ankle sleeve dispensed.  Wear daily 3.  Injection of 0.5 cc Celestone Soluspan injected into the right midtarsal joint 4.  Prescription for gabapentin 100 mg nightly as needed nocturnal neuritis/neuropathy 5.  Return to clinic in 4 weeks      Edrick Kins, DPM [facility]  Dr. Edrick Kins, DPM    [address], [place] [address]                Office [phone]  Fax [phone]

## 2021-04-03 ENCOUNTER — Other Ambulatory Visit: Payer: Self-pay | Admitting: Internal Medicine

## 2021-04-03 DIAGNOSIS — I10 Essential (primary) hypertension: Secondary | ICD-10-CM

## 2021-04-08 ENCOUNTER — Other Ambulatory Visit: Payer: Self-pay | Admitting: Internal Medicine

## 2021-04-08 DIAGNOSIS — F411 Generalized anxiety disorder: Secondary | ICD-10-CM

## 2021-04-08 NOTE — Telephone Encounter (Signed)
Last filled 11/27/2021... pt has upcoming TOC appt...Marland Kitchen please advise

## 2021-04-12 ENCOUNTER — Other Ambulatory Visit: Payer: Self-pay | Admitting: Internal Medicine

## 2021-04-15 NOTE — Telephone Encounter (Signed)
Looks like patient is transferring care to another office.  Review for changes until patient appt

## 2021-04-22 ENCOUNTER — Other Ambulatory Visit: Payer: Self-pay

## 2021-04-22 ENCOUNTER — Telehealth: Payer: Self-pay

## 2021-04-22 NOTE — Telephone Encounter (Signed)
Pt notified as instructed and pt voiced understanding. Nothing further needed at this time. Will send note to Laverna Peace FNP as Juluis Rainier for [facility] appt on 04/29/21.

## 2021-04-22 NOTE — Telephone Encounter (Signed)
Dutch Quint B, FNP to Me      04/22/21 1:16 PM Take Losartan 50 mg and Amlodipine 10 mg until her office visit. Do not take valsartan.

## 2021-04-22 NOTE — Telephone Encounter (Signed)
Pt called with questions about BP meds. Pt has not picked up the losartan 50 mg that was sent to [place] [facility] on 04/15/21.  Pt is presently taking Valsartan 160 mg every morning, Amlodipine 10 mg every morning and atenolol 25 mg at hs.  Pt said prior to taking valsartan pt was taking losartan 100 mg daily and amlodipine 5 mg daily. Pt said the amlodipine was increased to 10 mg from 5 mg when pt was started on valsartan 160 mg. Pt has TOC appt with Laverna Peace FNP at [facility] on 04/29/21 at 9 AM. Pt will be out of valsartan 160 mg on 04/28/21. Pt wants to know what she should do. Pt has no H/A,dizziness, CP or SOB. Pt took her BP while out of town over weekend and was 161/60 something. Pt just took BP 166/70 P? Pt said that is just slightly higher than her normal and pt has been on the phone with someone all morning so pt said that could cause her BP to go up. Pt request cb with what she should take the losartan 50 mg, losartan 100 mg and amlodipine 5 mg or 10 mg. Please advise. [facility].

## 2021-04-23 NOTE — Telephone Encounter (Signed)
Noted  

## 2021-04-28 NOTE — Progress Notes (Signed)
New Patient Office Visit  Subjective:  Patient ID: Kristen Powell, female    DOB: 04-May-1946  Age: 75 y.o. MRN: [id number]  CC:  Chief Complaint  Patient presents with  . Transitions Of Care    Pt wants to discuss blood pressure.    HPI Kristen Powell presents for new patient visit, she has elevate blood pressure at office visit today.  She has been taking losartan 50 mg and Norvasc 10 mg qd. Also atenolol 10 mg qh's as well. She is taking blood pressure readings at home and is not having any readings as high as today.  She was switched from Valsartan, back to Losartan on 50 mg po qd.    On Neurontin 100 mg po daily sees Podiatry Dr. Amalia Hailey right foot injection.  Prilosec for heartburn.   Hyperlipidemia- Zocor daily 20 mg po qd. Denies any myalgia.   She sees dermatologist.  Mammogram goes to [facility].   " having her 43 th great grandchild"  Husband passed away from renal cell carcinoma and she is managing well with her faith and has peace.  Denies any falls or edema.  Patient  denies any fever, body aches,chills, rash, chest pain, shortness of breath, nausea, vomiting, or diarrhea.  Denies dizziness, lightheadedness, pre syncopal or syncopal episodes.    Past Medical History:  Diagnosis Date  . Anxiety   . Hyperlipidemia   . Hypertension     Past Surgical History:  Procedure Laterality Date  . ABDOMINAL HYSTERECTOMY  1980   Partial  . BASAL CELL CARCINOMA EXCISION  2006,2008,2011  . BUNIONECTOMY    . BUNIONECTOMY Bilateral 1992  . KNEE ARTHROSCOPY    . KNEE CLOSED REDUCTION  05/07/2015   Procedure: CLOSED MANIPULATION KNEE;  Surgeon: Hessie Knows, MD;  Location: [facility] ORS;  Service: Orthopedics;;  . KNEE SURGERY Bilateral [phone]   Repair  . REPLACEMENT TOTAL KNEE Left 04/11/2015   manipulation--05/07/2015  . ROTATOR CUFF REPAIR W/ DISTAL CLAVICLE EXCISION    . SHOULDER SURGERY Right   . SKIN CANCER EXCISION    . TOTAL KNEE  ARTHROPLASTY    . VAGINAL HYSTERECTOMY      Family History  Problem Relation Age of Onset  . Cancer Mother        lung  . Hypertension Sister   . Breast cancer Paternal Aunt   . Heart attack Sister     Social History   Socioeconomic History  . Marital status: Widowed    Spouse name: Not on file  . Number of children: Not on file  . Years of education: Not on file  . Highest education level: Not on file  Occupational History  . Not on file  Tobacco Use  . Smoking status: Never Smoker  . Smokeless tobacco: Never Used  Substance and Sexual Activity  . Alcohol use: No  . Drug use: No  . Sexual activity: Never  Other Topics Concern  . Not on file  Social History Narrative   ** Merged History Encounter **       Social Determinants of Health   Financial Resource Strain: Not on file  Food Insecurity: Not on file  Transportation Needs: Not on file  Physical Activity: Not on file  Stress: Not on file  Social Connections: Not on file  Intimate Partner Violence: Not on file    ROS Review of Systems  Constitutional: Negative.   HENT: Negative.   Eyes: Negative.   Respiratory: Negative.  Cardiovascular: Negative.   Gastrointestinal: Negative.   Endocrine: Negative.   Genitourinary: Negative.   Musculoskeletal: Positive for arthralgias. Negative for back pain, gait problem, joint swelling, myalgias, neck pain and neck stiffness.       Left foot sees podiatry.   Skin: Negative.   Neurological: Negative.   Hematological: Negative.   Psychiatric/Behavioral: Negative for agitation, behavioral problems, confusion, decreased concentration, dysphoric mood, hallucinations, self-injury, sleep disturbance and suicidal ideas. The patient is nervous/anxious. The patient is not hyperactive.     Objective:   Today's Vitals: BP (!) 160/84 (BP Location: Left Arm, Patient Position: Sitting)   Pulse 67   Temp 98.2 F (36.8 C)   Ht 5' 0.83" (1.545 m)   Wt 168 lb 12.8 oz (76.6  kg)   SpO2 98%   BMI 32.08 kg/m   Physical Exam Vitals reviewed.  Constitutional:      General: She is not in acute distress.    Appearance: She is well-developed. She is obese. She is not ill-appearing, toxic-appearing or diaphoretic.     Interventions: She is not intubated.    Comments: Patient is alert and oriented and responsive to questions Engages in eye contact with provider. Speaks in full sentences without any pauses without any shortness of breath or distress.    HENT:     Head: Normocephalic and atraumatic.     Right Ear: Tympanic membrane, ear canal and external ear normal. There is no impacted cerumen.     Left Ear: Tympanic membrane, ear canal and external ear normal. There is no impacted cerumen.     Nose: Nose normal. No congestion or rhinorrhea.     Mouth/Throat:     Pharynx: No oropharyngeal exudate or posterior oropharyngeal erythema.  Eyes:     General: Lids are normal. No scleral icterus.       Right eye: No discharge.        Left eye: No discharge.     Conjunctiva/sclera: Conjunctivae normal.     Right eye: Right conjunctiva is not injected. No exudate or hemorrhage.    Left eye: Left conjunctiva is not injected. No exudate or hemorrhage.    Pupils: Pupils are equal, round, and reactive to light.  Neck:     Thyroid: No thyroid mass or thyromegaly.     Vascular: Normal carotid pulses. No carotid bruit, hepatojugular reflux or JVD.     Trachea: Trachea and phonation normal. No tracheal tenderness or tracheal deviation.     Meningeal: Brudzinski's sign and Kernig's sign absent.  Cardiovascular:     Rate and Rhythm: Normal rate and regular rhythm.     Pulses: Normal pulses.          Radial pulses are 2+ on the right side and 2+ on the left side.       Dorsalis pedis pulses are 2+ on the right side and 2+ on the left side.       Posterior tibial pulses are 2+ on the right side and 2+ on the left side.     Heart sounds: Normal heart sounds, S1 normal and S2  normal. Heart sounds not distant. No murmur heard. No friction rub. No gallop.   Pulmonary:     Effort: Pulmonary effort is normal. No tachypnea, bradypnea, accessory muscle usage or respiratory distress. She is not intubated.     Breath sounds: Normal breath sounds. No stridor. No wheezing, rhonchi or rales.  Chest:     Chest wall: No tenderness.  Breasts:  Right: No supraclavicular adenopathy.     Left: No supraclavicular adenopathy.    Abdominal:     General: Bowel sounds are normal. There is no distension or abdominal bruit.     Palpations: Abdomen is soft. There is no shifting dullness, fluid wave, hepatomegaly, splenomegaly, mass or pulsatile mass.     Tenderness: There is no abdominal tenderness. There is no right CVA tenderness, left CVA tenderness, guarding or rebound.     Hernia: No hernia is present.  Musculoskeletal:        General: No tenderness or deformity. Normal range of motion.     Cervical back: Full passive range of motion without pain, normal range of motion and neck supple. No edema, erythema, rigidity or tenderness. No spinous process tenderness or muscular tenderness. Normal range of motion.     Right lower leg: Edema (TRACE  BILATERAL ANKLE ) present.     Left lower leg: Edema present.  Lymphadenopathy:     Head:     Right side of head: No submental, submandibular, tonsillar, preauricular, posterior auricular or occipital adenopathy.     Left side of head: No submental, submandibular, tonsillar, preauricular, posterior auricular or occipital adenopathy.     Cervical: No cervical adenopathy.     Right cervical: No superficial, deep or posterior cervical adenopathy.    Left cervical: No superficial, deep or posterior cervical adenopathy.     Upper Body:     Right upper body: No supraclavicular or pectoral adenopathy.     Left upper body: No supraclavicular or pectoral adenopathy.  Skin:    General: Skin is warm and dry.     Coloration: Skin is not jaundiced  or pale.     Findings: No abrasion, bruising, burn, ecchymosis, erythema, lesion, petechiae or rash.     Nails: There is no clubbing.  Neurological:     General: No focal deficit present.     Mental Status: She is alert and oriented to person, place, and time.     GCS: GCS eye subscore is 4. GCS verbal subscore is 5. GCS motor subscore is 6.     Cranial Nerves: No cranial nerve deficit.     Sensory: No sensory deficit.     Motor: No weakness, tremor, atrophy, abnormal muscle tone or seizure activity.     Coordination: Coordination normal.     Gait: Gait normal.     Deep Tendon Reflexes: Reflexes are normal and symmetric. Reflexes normal. Babinski sign absent on the right side. Babinski sign absent on the left side.     Reflex Scores:      Tricep reflexes are 2+ on the right side and 2+ on the left side.      Bicep reflexes are 2+ on the right side and 2+ on the left side.      Brachioradialis reflexes are 2+ on the right side and 2+ on the left side.      Patellar reflexes are 2+ on the right side and 2+ on the left side.      Achilles reflexes are 2+ on the right side and 2+ on the left side. Psychiatric:        Mood and Affect: Mood normal.        Speech: Speech normal.        Behavior: Behavior normal.        Thought Content: Thought content normal.        Judgment: Judgment normal.     Assessment & Plan:  Problem List Items Addressed This Visit      Cardiovascular and Mediastinum   Essential hypertension - Primary   Relevant Orders   CBC with Differential/Platelet   Comprehensive metabolic panel     Musculoskeletal and Integument   Degenerative arthritis of knee   Relevant Orders   VITAMIN D 25 Hydroxy (Vit-D Deficiency, Fractures)     Other   HLD (hyperlipidemia)   Relevant Orders   CBC with Differential/Platelet   Comprehensive metabolic panel   TSH   Lipid panel   Generalized anxiety disorder   Relevant Orders   TSH   Vitamin D deficiency   Relevant  Orders   VITAMIN D 25 Hydroxy (Vit-D Deficiency, Fractures)   B12 deficiency due to diet   Relevant Orders   B12   Screening for blood or protein in urine   Relevant Orders   Urinalysis, Routine w reflex microscopic   S/P partial hysterectomy- has ovaries.      BLOOD PRESSURE is not controlled, however she is nervous for new patient appointment., and recently switched medications as in HPI. Will continue current medications and recheck in one month. She will keep log and call if any continued elevated blood pressures.    Return precautions given. Red Flags discussed. The patient was given clear instructions to go to ER or return to medical center if any red flags develop, symptoms do not improve, worsen or new problems develop. They verbalized understanding.    Risks, benefits, and alternatives of the medications and treatment plan prescribed today were discussed, and patient expressed understanding.    Education regarding symptom management and diagnosis given to patient on AVS.  Patient was in agreement with treatment plan.   Continue to follow with  Kelby Aline. Elo Marmolejos AGNP-C, FNP-C for routine health maintenance.   Kelby Aline. Loreli Debruler AGNP-C, FNP-C  Outpatient Encounter Medications as of 04/29/2021  Medication Sig  . acetaminophen (TYLENOL) 500 MG tablet Take 1-2 tablets (500-1,000 mg total) by mouth every 4 (four) hours as needed for fever.  Marland Kitchen amLODipine (NORVASC) 10 MG tablet TAKE 1 TABLET BY MOUTH EVERY DAY  . aspirin 81 MG tablet Take 81 mg by mouth daily.  Marland Kitchen atenolol (TENORMIN) 25 MG tablet TAKE 1 TABLET BY MOUTH EVERY DAY  . calcium carbonate (OS-CAL) 600 MG TABS tablet Take 600 mg by mouth daily.  . Cholecalciferol (VITAMIN D) 2000 UNITS CAPS Take 1 capsule by mouth daily.  Marland Kitchen CRANBERRY PO Take 1 capsule by mouth daily. 4200mg   . gabapentin (NEURONTIN) 100 MG capsule Take 1 capsule (100 mg total) by mouth at bedtime.  Marland Kitchen ibuprofen (ADVIL,MOTRIN) 200 MG tablet Take 200  mg by mouth as needed.  Marland Kitchen losartan (COZAAR) 50 MG tablet Take 1 tablet (50 mg total) by mouth daily.  . Omega-3 Fatty Acids (FISH OIL) 1200 MG CAPS Take 1 capsule by mouth 2 (two) times daily.  Marland Kitchen omeprazole (PRILOSEC) 20 MG capsule TAKE 1 CAPSULE BY MOUTH TWICE A DAY  . simvastatin (ZOCOR) 20 MG tablet TAKE 1 TABLET BY MOUTH EVERY DAY AT 6PM  . venlafaxine XR (EFFEXOR-XR) 37.5 MG 24 hr capsule TAKE 1 CAPSULE BY MOUTH DAILY WITH BREAKFAST.  Marland Kitchen vitamin B-12 (CYANOCOBALAMIN) 1000 MCG tablet Take 1,000 mcg by mouth daily.  Marland Kitchen gentamicin cream (GARAMYCIN) 0.1 % Apply 1 application topically 2 (two) times daily. (Patient not taking: Reported on 04/29/2021)   No facility-administered encounter medications on file as of 04/29/2021.    Follow-up: Return in about 1 month (around  05/30/2021), or if symptoms worsen or fail to improve, for at any time for any worsening symptoms, Go to Emergency room/ urgent care if worse.   Marcille Buffy, FNP

## 2021-04-29 ENCOUNTER — Other Ambulatory Visit: Payer: Self-pay | Admitting: Internal Medicine

## 2021-04-29 ENCOUNTER — Other Ambulatory Visit: Payer: Self-pay

## 2021-04-29 ENCOUNTER — Encounter: Payer: Self-pay | Admitting: Adult Health

## 2021-04-29 ENCOUNTER — Other Ambulatory Visit: Payer: Medicare Other

## 2021-04-29 ENCOUNTER — Ambulatory Visit (INDEPENDENT_AMBULATORY_CARE_PROVIDER_SITE_OTHER): Payer: Medicare Other | Admitting: Adult Health

## 2021-04-29 ENCOUNTER — Telehealth: Payer: Self-pay

## 2021-04-29 VITALS — BP 160/84 | HR 67 | Temp 98.2°F | Ht 60.83 in | Wt 168.8 lb

## 2021-04-29 DIAGNOSIS — Z1389 Encounter for screening for other disorder: Secondary | ICD-10-CM

## 2021-04-29 DIAGNOSIS — I10 Essential (primary) hypertension: Secondary | ICD-10-CM

## 2021-04-29 DIAGNOSIS — Z1211 Encounter for screening for malignant neoplasm of colon: Secondary | ICD-10-CM | POA: Diagnosis not present

## 2021-04-29 DIAGNOSIS — M1710 Unilateral primary osteoarthritis, unspecified knee: Secondary | ICD-10-CM

## 2021-04-29 DIAGNOSIS — E538 Deficiency of other specified B group vitamins: Secondary | ICD-10-CM

## 2021-04-29 DIAGNOSIS — F411 Generalized anxiety disorder: Secondary | ICD-10-CM | POA: Diagnosis not present

## 2021-04-29 DIAGNOSIS — E559 Vitamin D deficiency, unspecified: Secondary | ICD-10-CM

## 2021-04-29 DIAGNOSIS — M179 Osteoarthritis of knee, unspecified: Secondary | ICD-10-CM

## 2021-04-29 DIAGNOSIS — E785 Hyperlipidemia, unspecified: Secondary | ICD-10-CM

## 2021-04-29 DIAGNOSIS — Z90711 Acquired absence of uterus with remaining cervical stump: Secondary | ICD-10-CM | POA: Diagnosis not present

## 2021-04-29 LAB — COMPREHENSIVE METABOLIC PANEL
ALT: 22 U/L (ref 0–35)
AST: 20 U/L (ref 0–37)
Albumin: 4.2 g/dL (ref 3.5–5.2)
Alkaline Phosphatase: 69 U/L (ref 39–117)
BUN: 19 mg/dL (ref 6–23)
CO2: 28 mEq/L (ref 19–32)
Calcium: 9.2 mg/dL (ref 8.4–10.5)
Chloride: 97 mEq/L (ref 96–112)
Creatinine, Ser: 0.91 mg/dL (ref 0.40–1.20)
GFR: 61.81 mL/min (ref >60.00)
Glucose, Bld: 93 mg/dL (ref 70–99)
Potassium: 4.8 mEq/L (ref 3.5–5.1)
Sodium: 133 mEq/L — ABNORMAL LOW (ref 135–145)
Total Bilirubin: 0.4 mg/dL (ref 0.2–1.2)
Total Protein: 7.1 g/dL (ref 6.0–8.3)

## 2021-04-29 LAB — LIPID PANEL
Cholesterol: 204 mg/dL — ABNORMAL HIGH (ref 0–200)
HDL: 54 mg/dL (ref >39.00)
NonHDL: 150.13
Total CHOL/HDL Ratio: 4
Triglycerides: 206 mg/dL — ABNORMAL HIGH (ref 0.0–149.0)
VLDL: 41.2 mg/dL — ABNORMAL HIGH (ref 0.0–40.0)

## 2021-04-29 LAB — CBC WITH DIFFERENTIAL/PLATELET
Basophils Absolute: 0 10*3/uL (ref 0.0–0.1)
Basophils Relative: 0.3 % (ref 0.0–3.0)
Eosinophils Absolute: 0.1 10*3/uL (ref 0.0–0.7)
Eosinophils Relative: 2.1 % (ref 0.0–5.0)
HCT: 37.5 % (ref 36.0–46.0)
Hemoglobin: 13 g/dL (ref 12.0–15.0)
Lymphocytes Relative: 20 % (ref 12.0–46.0)
Lymphs Abs: 1.3 10*3/uL (ref 0.7–4.0)
MCHC: 34.8 g/dL (ref 30.0–36.0)
MCV: 96.8 fl (ref 78.0–100.0)
Monocytes Absolute: 0.8 10*3/uL (ref 0.1–1.0)
Monocytes Relative: 11.7 % (ref 3.0–12.0)
Neutro Abs: 4.3 10*3/uL (ref 1.4–7.7)
Neutrophils Relative %: 65.9 % (ref 43.0–77.0)
Platelets: 225 10*3/uL (ref 150.0–400.0)
RBC: 3.87 Mil/uL (ref 3.87–5.11)
RDW: 12.7 % (ref 11.5–15.5)
WBC: 6.6 10*3/uL (ref 4.0–10.5)

## 2021-04-29 LAB — URINALYSIS, ROUTINE W REFLEX MICROSCOPIC
Bilirubin Urine: NEGATIVE
Ketones, ur: NEGATIVE
Nitrite: POSITIVE — AB
Specific Gravity, Urine: 1.015 (ref 1.000–1.030)
Total Protein, Urine: NEGATIVE
Urine Glucose: NEGATIVE
Urobilinogen, UA: 0.2 (ref 0.0–1.0)
pH: 7 (ref 5.0–8.0)

## 2021-04-29 LAB — TSH: TSH: 4.28 u[IU]/mL (ref 0.35–4.50)

## 2021-04-29 LAB — VITAMIN B12: Vitamin B-12: 917 pg/mL — ABNORMAL HIGH (ref 211–911)

## 2021-04-29 LAB — LDL CHOLESTEROL, DIRECT: Direct LDL: 123 mg/dL

## 2021-04-29 LAB — VITAMIN D 25 HYDROXY (VIT D DEFICIENCY, FRACTURES): VITD: 47.4 ng/mL (ref 30.00–100.00)

## 2021-04-29 NOTE — Telephone Encounter (Signed)
Urine culture has been added.

## 2021-04-29 NOTE — Progress Notes (Signed)
Please add on urine culture - bacteria in urine microscopic

## 2021-04-29 NOTE — Patient Instructions (Signed)
Managing Your Hypertension Hypertension, also called high blood pressure, is when the force of the blood pressing against the walls of the arteries is too strong. Arteries are blood vessels that carry blood from your heart throughout your body. Hypertension forces the heart to work harder to pump blood and may cause the arteries to become narrow or stiff. Understanding blood pressure readings Your personal target blood pressure may vary depending on your medical conditions, your age, and other factors. A blood pressure reading includes a higher number over a lower number. Ideally, your blood pressure should be below 120/80. You should know that:  The first, or top, number is called the systolic pressure. It is a measure of the pressure in your arteries as your heart beats.  The second, or bottom number, is called the diastolic pressure. It is a measure of the pressure in your arteries as the heart relaxes. Blood pressure is classified into four stages. Based on your blood pressure reading, your health care provider may use the following stages to determine what type of treatment you need, if any. Systolic pressure and diastolic pressure are measured in a unit called mmHg. Normal  Systolic pressure: below 120.  Diastolic pressure: below 80. Elevated  Systolic pressure: 120-129.  Diastolic pressure: below 80. Hypertension stage 1  Systolic pressure: 130-139.  Diastolic pressure: 80-89. Hypertension stage 2  Systolic pressure: 140 or above.  Diastolic pressure: 90 or above. How can this condition affect me? Managing your hypertension is an important responsibility. Over time, hypertension can damage the arteries and decrease blood flow to important parts of the body, including the brain, heart, and kidneys. Having untreated or uncontrolled hypertension can lead to:  A heart attack.  A stroke.  A weakened blood vessel (aneurysm).  Heart failure.  Kidney damage.  Eye  damage.  Metabolic syndrome.  Memory and concentration problems.  Vascular dementia. What actions can I take to manage this condition? Hypertension can be managed by making lifestyle changes and possibly by taking medicines. Your health care provider will help you make a plan to bring your blood pressure within a normal range. Nutrition  Eat a diet that is high in fiber and potassium, and low in salt (sodium), added sugar, and fat. An example eating plan is called the Dietary Approaches to Stop Hypertension (DASH) diet. To eat this way: ? Eat plenty of fresh fruits and vegetables. Try to fill one-half of your plate at each meal with fruits and vegetables. ? Eat whole grains, such as whole-wheat pasta, brown rice, or whole-grain bread. Fill about one-fourth of your plate with whole grains. ? Eat low-fat dairy products. ? Avoid fatty cuts of meat, processed or cured meats, and poultry with skin. Fill about one-fourth of your plate with lean proteins such as fish, chicken without skin, beans, eggs, and tofu. ? Avoid pre-made and processed foods. These tend to be higher in sodium, added sugar, and fat.  Reduce your daily sodium intake. Most people with hypertension should eat less than 1,500 mg of sodium a day.   Lifestyle  Work with your health care provider to maintain a healthy body weight or to lose weight. Ask what an ideal weight is for you.  Get at least 30 minutes of exercise that causes your heart to beat faster (aerobic exercise) most days of the week. Activities may include walking, swimming, or biking.  Include exercise to strengthen your muscles (resistance exercise), such as weight lifting, as part of your weekly exercise routine. Try   to do these types of exercises for 30 minutes at least 3 days a week.  Do not use any products that contain nicotine or tobacco, such as cigarettes, e-cigarettes, and chewing tobacco. If you need help quitting, ask your health care  provider.  Control any long-term (chronic) conditions you have, such as high cholesterol or diabetes.  Identify your sources of stress and find ways to manage stress. This may include meditation, deep breathing, or making time for fun activities.   Alcohol use  Do not drink alcohol if: ? Your health care provider tells you not to drink. ? You are pregnant, may be pregnant, or are planning to become pregnant.  If you drink alcohol: ? Limit how much you use to:  0-1 drink a day for women.  0-2 drinks a day for men. ? Be aware of how much alcohol is in your drink. In the [place] one drink equals one 12 oz bottle of beer (355 mL), one 5 oz glass of wine (148 mL), or one 1 oz glass of hard liquor (44 mL). Medicines Your health care provider may prescribe medicine if lifestyle changes are not enough to get your blood pressure under control and if:  Your systolic blood pressure is 130 or higher.  Your diastolic blood pressure is 80 or higher. Take medicines only as told by your health care provider. Follow the directions carefully. Blood pressure medicines must be taken as told by your health care provider. The medicine does not work as well when you skip doses. Skipping doses also puts you at risk for problems. Monitoring Before you monitor your blood pressure:  Do not smoke, drink caffeinated beverages, or exercise within 30 minutes before taking a measurement.  Use the bathroom and empty your bladder (urinate).  Sit quietly for at least 5 minutes before taking measurements. Monitor your blood pressure at home as told by your health care provider. To do this:  Sit with your back straight and supported.  Place your feet flat on the floor. Do not cross your legs.  Support your arm on a flat surface, such as a table. Make sure your upper arm is at heart level.  Each time you measure, take two or three readings one minute apart and record the results. You may also need to have your  blood pressure checked regularly by your health care provider.   General information  Talk with your health care provider about your diet, exercise habits, and other lifestyle factors that may be contributing to hypertension.  Review all the medicines you take with your health care provider because there may be side effects or interactions.  Keep all visits as told by your health care provider. Your health care provider can help you create and adjust your plan for managing your high blood pressure. Where to find more information  National Heart, Lung, and Blood Institute: www.nhlbi.nih.gov  American Heart Association: www.heart.org Contact a health care provider if:  You think you are having a reaction to medicines you have taken.  You have repeated (recurrent) headaches.  You feel dizzy.  You have swelling in your ankles.  You have trouble with your vision. Get help right away if:  You develop a severe headache or confusion.  You have unusual weakness or numbness, or you feel faint.  You have severe pain in your chest or abdomen.  You vomit repeatedly.  You have trouble breathing. These symptoms may represent a serious problem that is an emergency. Do not wait   to see if the symptoms will go away. Get medical help right away. Call your local emergency services (911 in the [place] Do not drive yourself to the hospital. Summary  Hypertension is when the force of blood pumping through your arteries is too strong. If this condition is not controlled, it may put you at risk for serious complications.  Your personal target blood pressure may vary depending on your medical conditions, your age, and other factors. For most people, a normal blood pressure is less than 120/80.  Hypertension is managed by lifestyle changes, medicines, or both.  Lifestyle changes to help manage hypertension include losing weight, eating a healthy, low-sodium diet, exercising more, stopping smoking, and  limiting alcohol. This information is not intended to replace advice given to you by your health care provider. Make sure you discuss any questions you have with your health care provider. Document Revised: 01/05/2020 Document Reviewed: 10/31/2019 Elsevier Patient Education  2021 Elsevier Inc. Hypertension, Adult Hypertension is another name for high blood pressure. High blood pressure forces your heart to work harder to pump blood. This can cause problems over time. There are two numbers in a blood pressure reading. There is a top number (systolic) over a bottom number (diastolic). It is best to have a blood pressure that is below 120/80. Healthy choices can help lower your blood pressure, or you may need medicine to help lower it. What are the causes? The cause of this condition is not known. Some conditions may be related to high blood pressure. What increases the risk?  Smoking.  Having type 2 diabetes mellitus, high cholesterol, or both.  Not getting enough exercise or physical activity.  Being overweight.  Having too much fat, sugar, calories, or salt (sodium) in your diet.  Drinking too much alcohol.  Having long-term (chronic) kidney disease.  Having a family history of high blood pressure.  Age. Risk increases with age.  Race. You may be at higher risk if you are African American.  Gender. Men are at higher risk than women before age 45. After age 65, women are at higher risk than men.  Having obstructive sleep apnea.  Stress. What are the signs or symptoms?  High blood pressure may not cause symptoms. Very high blood pressure (hypertensive crisis) may cause: ? Headache. ? Feelings of worry or nervousness (anxiety). ? Shortness of breath. ? Nosebleed. ? A feeling of being sick to your stomach (nausea). ? Throwing up (vomiting). ? Changes in how you see. ? Very bad chest pain. ? Seizures. How is this treated?  This condition is treated by making healthy  lifestyle changes, such as: ? Eating healthy foods. ? Exercising more. ? Drinking less alcohol.  Your health care provider may prescribe medicine if lifestyle changes are not enough to get your blood pressure under control, and if: ? Your top number is above 130. ? Your bottom number is above 80.  Your personal target blood pressure may vary. Follow these instructions at home: Eating and drinking  If told, follow the DASH eating plan. To follow this plan: ? Fill one half of your plate at each meal with fruits and vegetables. ? Fill one fourth of your plate at each meal with whole grains. Whole grains include whole-wheat pasta, brown rice, and whole-grain bread. ? Eat or drink low-fat dairy products, such as skim milk or low-fat yogurt. ? Fill one fourth of your plate at each meal with low-fat (lean) proteins. Low-fat proteins include fish, chicken without skin, eggs,   beans, and tofu. ? Avoid fatty meat, cured and processed meat, or chicken with skin. ? Avoid pre-made or processed food.  Eat less than 1,500 mg of salt each day.  Do not drink alcohol if: ? Your doctor tells you not to drink. ? You are pregnant, may be pregnant, or are planning to become pregnant.  If you drink alcohol: ? Limit how much you use to:  0-1 drink a day for women.  0-2 drinks a day for men. ? Be aware of how much alcohol is in your drink. In the [place] one drink equals one 12 oz bottle of beer (355 mL), one 5 oz glass of wine (148 mL), or one 1 oz glass of hard liquor (44 mL).   Lifestyle  Work with your doctor to stay at a healthy weight or to lose weight. Ask your doctor what the best weight is for you.  Get at least 30 minutes of exercise most days of the week. This may include walking, swimming, or biking.  Get at least 30 minutes of exercise that strengthens your muscles (resistance exercise) at least 3 days a week. This may include lifting weights or doing Pilates.  Do not use any products  that contain nicotine or tobacco, such as cigarettes, e-cigarettes, and chewing tobacco. If you need help quitting, ask your doctor.  Check your blood pressure at home as told by your doctor.  Keep all follow-up visits as told by your doctor. This is important.   Medicines  Take over-the-counter and prescription medicines only as told by your doctor. Follow directions carefully.  Do not skip doses of blood pressure medicine. The medicine does not work as well if you skip doses. Skipping doses also puts you at risk for problems.  Ask your doctor about side effects or reactions to medicines that you should watch for. Contact a doctor if you:  Think you are having a reaction to the medicine you are taking.  Have headaches that keep coming back (recurring).  Feel dizzy.  Have swelling in your ankles.  Have trouble with your vision. Get help right away if you:  Get a very bad headache.  Start to feel mixed up (confused).  Feel weak or numb.  Feel faint.  Have very bad pain in your: ? Chest. ? Belly (abdomen).  Throw up more than once.  Have trouble breathing. Summary  Hypertension is another name for high blood pressure.  High blood pressure forces your heart to work harder to pump blood.  For most people, a normal blood pressure is less than 120/80.  Making healthy choices can help lower blood pressure. If your blood pressure does not get lower with healthy choices, you may need to take medicine. This information is not intended to replace advice given to you by your health care provider. Make sure you discuss any questions you have with your health care provider. Document Revised: 08/10/2018 Document Reviewed: 08/10/2018 Elsevier Patient Education  2021 Elsevier Inc.  

## 2021-04-30 ENCOUNTER — Other Ambulatory Visit: Payer: Medicare Other

## 2021-04-30 NOTE — Progress Notes (Signed)
CMP shows mildly low sodium, be sure she is not over hydrating. Direct LDL is within normal limits. Total cholesterol, triglycerides elevated.  Discuss lifestyle modification with patient e.g. increase exercise, fiber, fruits, vegetables, lean meat, and omega 3/fish intake and decrease saturated fat.  If patient following strict diet and exercise program already please schedule follow up appointment with primary care physician  Vitamin D is ok continue current dosage she is taking. B 12 vitamin is mildly elevated.  CBC within normal standard limits.   Urine culture still pending.

## 2021-05-01 LAB — URINE CULTURE
MICRO NUMBER:: 11900108
SPECIMEN QUALITY:: ADEQUATE

## 2021-05-02 ENCOUNTER — Telehealth: Payer: Self-pay

## 2021-05-02 DIAGNOSIS — I10 Essential (primary) hypertension: Secondary | ICD-10-CM

## 2021-05-02 DIAGNOSIS — N390 Urinary tract infection, site not specified: Secondary | ICD-10-CM

## 2021-05-02 MED ORDER — NITROFURANTOIN MONOHYD MACRO 100 MG PO CAPS: 100.0000 mg | ORAL_CAPSULE | Freq: Two times a day (BID) | ORAL | 0 refills | Status: DC

## 2021-05-02 MED ORDER — ATENOLOL 25 MG PO TABS: 25.0000 mg | ORAL_TABLET | Freq: Every day | ORAL | 0 refills | Status: DC

## 2021-05-02 MED ORDER — AMLODIPINE BESYLATE 10 MG PO TABS: 1.0000 | ORAL_TABLET | Freq: Every day | ORAL | 0 refills | Status: DC

## 2021-05-02 NOTE — Telephone Encounter (Signed)
Patient has called in asking for the results to be reviewed for the urine culture. Results have come back on 04/29/2021 for urine culture and are positive for E.Coli. Sending to Doc of the day as PCP is not in office.

## 2021-05-02 NOTE — Telephone Encounter (Signed)
Call pt Urine culture is positive for ecoli I have sent in macrobid Ensure to take probiotics while on antibiotics and also for 2 weeks after completion. This can either be by eating yogurt daily or taking a probiotic supplement over the counter such as Culturelle.It is important to re-colonize the gut with good bacteria and also to prevent any diarrheal infections associated with antibiotic use.    Please order UA and sch in 4 weeks time to ensure infection has cleared  If she develops fever, abdominal pain, nausea, vomiting, please ask her to go to UC over the weekend for reassessment

## 2021-05-02 NOTE — Telephone Encounter (Signed)
Pt needs a refill of atenolol (TENORMIN) 25 MG tablet and amLODipine (NORVASC) 10 MG tablet

## 2021-05-02 NOTE — Telephone Encounter (Signed)
Called and spoke to [place]. Kristen Powell verbalized understanding and had no further questions. She has an appointment upcoming at 06/02/2021 with Laverna Peace and she wants to wait for then to give a urine sample.

## 2021-05-04 ENCOUNTER — Other Ambulatory Visit: Payer: Self-pay | Admitting: Adult Health

## 2021-05-04 NOTE — Progress Notes (Signed)
She can switch her B12 to every other day dosing. Urine has been resulted.

## 2021-05-04 NOTE — Progress Notes (Signed)
Margaret FNP  called in Nitrofurantoin, please follow up if any symptoms persistingf or worsning at anytime.

## 2021-05-06 ENCOUNTER — Encounter: Payer: Self-pay | Admitting: Podiatry

## 2021-05-06 ENCOUNTER — Other Ambulatory Visit: Payer: Self-pay

## 2021-05-06 ENCOUNTER — Ambulatory Visit (INDEPENDENT_AMBULATORY_CARE_PROVIDER_SITE_OTHER): Payer: Medicare Other | Admitting: Podiatry

## 2021-05-06 DIAGNOSIS — R600 Localized edema: Secondary | ICD-10-CM

## 2021-05-06 DIAGNOSIS — G5791 Unspecified mononeuropathy of right lower limb: Secondary | ICD-10-CM

## 2021-05-06 DIAGNOSIS — M778 Other enthesopathies, not elsewhere classified: Secondary | ICD-10-CM

## 2021-05-06 MED ORDER — METHYLPREDNISOLONE 4 MG PO TBPK: ORAL_TABLET | ORAL | 0 refills | Status: DC

## 2021-05-06 MED ORDER — BETAMETHASONE SOD PHOS & ACET 6 (3-3) MG/ML IJ SUSP: 3.0000 mg | Freq: Once | INTRAMUSCULAR | Status: DC

## 2021-05-06 NOTE — Progress Notes (Signed)
   HPI: 75 y.o. female presenting today for follow-up evaluation of recurrent swelling to the right foot with numbness that is intermittent as well as pain to the right midfoot.  Patient states that overall there is significant improvement.  She says that the compression ankle sleeve and the injection she received last visit helped significantly.  She presents for further treatment and evaluation  Past Medical History:  Diagnosis Date  . Anxiety   . Hyperlipidemia   . Hypertension      Physical Exam: General: The patient is alert and oriented x3 in no acute distress.  Dermatology: Skin is warm, dry and supple bilateral lower extremities. Negative for open lesions or macerations.  Vascular: Palpable pedal pulses bilaterally.  No erythema noted.  There is some improved diffuse edema noted to the right foot and leg.  Capillary refill within normal limits.  Neurological: Epicritic and protective threshold grossly intact bilaterally.  There is some paresthesia noted to the dorsal aspect of the foot  Musculoskeletal Exam: Range of motion within normal limits to all pedal and ankle joints bilateral. Muscle strength 5/5 in all groups bilateral.  Mild pain on palpation diffusely throughout the right foot midtarsal joints    Assessment: 1.  Capsulitis/DJD right foot 2.  Edema right foot 3.  Neuritis right foot   Plan of Care:  1. Patient evaluated.  2.  Continue compression ankle sleeve.  Wear daily 3.  Injection of 0.5 cc Celestone Soluspan injected into the right midtarsal joint 4.  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  Patient states that it helped significantly 5.  Return to clinic as needed      Edrick Kins, DPM [facility]  Dr. Edrick Kins, DPM    [address], [place] [address]                Office [phone]  Fax [phone]

## 2021-05-06 NOTE — Addendum Note (Signed)
Addended by: Edrick Kins on: 05/06/2021 05:34 PM   Modules accepted: Orders

## 2021-05-20 ENCOUNTER — Other Ambulatory Visit: Payer: Self-pay | Admitting: Internal Medicine

## 2021-05-20 NOTE — Telephone Encounter (Signed)
Please see medication request for refill

## 2021-05-21 ENCOUNTER — Other Ambulatory Visit: Payer: Self-pay | Admitting: Adult Health

## 2021-05-21 ENCOUNTER — Other Ambulatory Visit: Payer: Self-pay | Admitting: Podiatry

## 2021-05-21 DIAGNOSIS — I10 Essential (primary) hypertension: Secondary | ICD-10-CM

## 2021-05-21 NOTE — Telephone Encounter (Signed)
Please advise 

## 2021-05-29 ENCOUNTER — Telehealth: Payer: Medicare Other | Admitting: Physician Assistant

## 2021-05-29 ENCOUNTER — Telehealth: Payer: Self-pay

## 2021-05-29 DIAGNOSIS — U071 COVID-19: Secondary | ICD-10-CM

## 2021-05-29 MED ORDER — NIRMATRELVIR/RITONAVIR (PAXLOVID)TABLET: 3.0000 | ORAL_TABLET | Freq: Two times a day (BID) | ORAL | 0 refills | Status: AC

## 2021-05-29 MED ORDER — NIRMATRELVIR/RITONAVIR (PAXLOVID)TABLET: 3.0000 | ORAL_TABLET | Freq: Two times a day (BID) | ORAL | 0 refills | Status: DC

## 2021-05-29 MED ORDER — BENZONATATE 100 MG PO CAPS: 100.0000 mg | ORAL_CAPSULE | Freq: Three times a day (TID) | ORAL | 0 refills | Status: DC | PRN

## 2021-05-29 NOTE — Telephone Encounter (Signed)
Pt called in to ask for a covid pack to treat covid symptoms. Pt does not know the name of the medication. She states that she went to a wedding on 05/24/21 and is now sick with nasal drainage, cough, and sneezing. Pt states she was tested for covid and it came back positive. She called in to access nurse to ask for a covid pack as her sister was given one after calling into the office. She has an upcoming appointment scheduled for Sharyn Lull [place] for Monday at 8:30am. Please Advise

## 2021-05-29 NOTE — Patient Instructions (Signed)
Rise Paganini, thank you for joining Leeanne Rio, PA-C for today's virtual visit.  While this provider is not your primary care provider (PCP), if your PCP is located in our provider database this encounter information will be shared with them immediately following your visit.  Consent: (Patient) Kristen Powell provided verbal consent for this virtual visit at the beginning of the encounter.  Current Medications:  Current Outpatient Medications:    benzonatate (TESSALON) 100 MG capsule, Take 1 capsule (100 mg total) by mouth 3 (three) times daily as needed for cough., Disp: 30 capsule, Rfl: 0   nirmatrelvir/ritonavir EUA (PAXLOVID) TABS, Take 3 tablets by mouth 2 (two) times daily for 5 days. (Take nirmatrelvir 150 mg two tablets twice daily for 5 days and ritonavir 100 mg one tablet twice daily for 5 days) Patient GFR is 61, Disp: 30 tablet, Rfl: 0   simvastatin (ZOCOR) 20 MG tablet, TAKE 1 TABLET BY MOUTH EVERY DAY AT 6PM, Disp: 90 tablet, Rfl: 2   acetaminophen (TYLENOL) 500 MG tablet, Take 1-2 tablets (500-1,000 mg total) by mouth every 4 (four) hours as needed for fever., Disp: 30 tablet, Rfl: 0   amLODipine (NORVASC) 10 MG tablet, Take 1 tablet (10 mg total) by mouth daily., Disp: 90 tablet, Rfl: 0   aspirin 81 MG tablet, Take 81 mg by mouth daily., Disp: , Rfl:    atenolol (TENORMIN) 25 MG tablet, Take 1 tablet (25 mg total) by mouth daily., Disp: 30 tablet, Rfl: 0   calcium carbonate (OS-CAL) 600 MG TABS tablet, Take 600 mg by mouth daily., Disp: , Rfl:    Cholecalciferol (VITAMIN D) 2000 UNITS CAPS, Take 1 capsule by mouth daily., Disp: , Rfl:    gabapentin (NEURONTIN) 100 MG capsule, TAKE 1 CAPSULE BY MOUTH AT BEDTIME., Disp: 30 capsule, Rfl: 1   ibuprofen (ADVIL,MOTRIN) 200 MG tablet, Take 200 mg by mouth as needed., Disp: , Rfl:    losartan (COZAAR) 50 MG tablet, Take 1 tablet (50 mg total) by mouth daily., Disp: 90 tablet, Rfl: 0   methylPREDNISolone (MEDROL  DOSEPAK) 4 MG TBPK tablet, 6 day dose pack - take as directed, Disp: 21 tablet, Rfl: 0   Omega-3 Fatty Acids (FISH OIL) 1200 MG CAPS, Take 1 capsule by mouth 2 (two) times daily., Disp: , Rfl:    omeprazole (PRILOSEC) 20 MG capsule, TAKE 1 CAPSULE BY MOUTH TWICE A DAY, Disp: 180 capsule, Rfl: 0   venlafaxine XR (EFFEXOR-XR) 37.5 MG 24 hr capsule, TAKE 1 CAPSULE BY MOUTH DAILY WITH BREAKFAST., Disp: 90 capsule, Rfl: 0   vitamin B-12 (CYANOCOBALAMIN) 1000 MCG tablet, Take 1,000 mcg by mouth daily., Disp: , Rfl:   Current Facility-Administered Medications:    betamethasone acetate-betamethasone sodium phosphate (CELESTONE) injection 3 mg, 3 mg, Intra-articular, Once, Edrick Kins, DPM   Medications ordered in this encounter:  Meds ordered this encounter  Medications   nirmatrelvir/ritonavir EUA (PAXLOVID) TABS    Sig: Take 3 tablets by mouth 2 (two) times daily for 5 days. (Take nirmatrelvir 150 mg two tablets twice daily for 5 days and ritonavir 100 mg one tablet twice daily for 5 days) Patient GFR is 61    Dispense:  30 tablet    Refill:  0    Order Specific Question:   Supervising Provider    Answer:   Sabra Heck, BRIAN [[id number]]   benzonatate (TESSALON) 100 MG capsule    Sig: Take 1 capsule (100 mg total) by mouth 3 (three) times daily  as needed for cough.    Dispense:  30 capsule    Refill:  0    Order Specific Question:   Supervising Provider    Answer:   Sabra Heck, [place]on     *If you need refills on other medications prior to your next appointment, please contact your pharmacy*  Follow-Up: Call back or seek an in-person evaluation if the symptoms worsen or if the condition fails to improve as anticipated.  Other Instructions Please keep well-hydrated and get plenty of rest. Can start OTC plain Mucinex for congestion. Continue your saline nasal rinse and Flonase. Dart OTC vitamin C 1000 mg in addition to your vitamin D supplement. If you have a humidifier, place it in the bedroom  and run at night. I sent in the antiviral medicine to your pharmacy for you to take as directed. Remember you must stay off of your simvastatin (Zocor) while on Paxlovid and for 5 more days after stopping. This is very important.   You have been enrolled in a symptom monitoring program. Please fill out these MyChart questionnaires daily so we can keep track of your symptoms. If you note any acute worsening of symptoms, any chest pain or any significant shortness of breath, please be evaluated at nearest emergency department ASAP.  Please do not delay care!  As discussed, you need to quarantine for 5 days from symptom onset. After day 5, if no fever for 24 hours and you are feeling better, okay to end quarantine but you need to keep mask for an additional 5 days. If after day 5 of quarantine, you are having any fever or any significant symptoms, please quarantine for full 10 days.   If you have been instructed to have an in-person evaluation today at a local Urgent Care facility, please use the link below. It will take you to a list of all of our available [facility], including address, phone number and hours of operation. Please do not delay care.  [facility]  If you or a family member do not have a primary care provider, use the link below to schedule a visit and establish care. When you choose a [facility] primary care physician or advanced practice provider, you gain a long-term partner in health. Find a Primary Care Provider  Learn more about 's in-office and virtual care options: [facility]

## 2021-05-29 NOTE — Addendum Note (Signed)
Addended by: Brunetta Jeans on: 05/29/2021 07:06 PM   Modules accepted: Orders

## 2021-05-29 NOTE — Telephone Encounter (Signed)
Agree pt needs to be seen 

## 2021-05-29 NOTE — Telephone Encounter (Signed)
Pt states that she was informed that there was no availability today and can not receive the medication without an appointment. Kristen Powell called back frustrated, stating that she can not make it to an Urgent Care as she is sick and a widow with no transportation. I informed her that she can see a urgent care provider through mychart and walked her through the steps to see a provider. Pt asks if she will receive the Paxlovid medication by doing so. I informed her that this can does not guarantee that she will get Paxlovid and it is up to the provider seen to prescribe the medication. Kristen Powell agreed and continued to schedule an appointment for an urgent care video visit. Kristen Powell states that she will ask for Paxlovid and a cough medicine to help with symptoms. She has kept her virtual appointment with Kristen Powell on 06/02/2021.

## 2021-05-29 NOTE — Telephone Encounter (Signed)
Noted  

## 2021-05-29 NOTE — Progress Notes (Signed)
Virtual Visit Consent   [name], you are scheduled for a virtual visit with a [facility] provider today.     Just as with appointments in the office, your consent must be obtained to participate.  Your consent will be active for this visit and any virtual visit you may have with one of our providers in the next 365 days.     If you have a MyChart account, a copy of this consent can be sent to you electronically.  All virtual visits are billed to your insurance company just like a traditional visit in the office.    As this is a virtual visit, video technology does not allow for your provider to perform a traditional examination.  This may limit your provider's ability to fully assess your condition.  If your provider identifies any concerns that need to be evaluated in person or the need to arrange testing (such as labs, EKG, etc.), we will make arrangements to do so.     Although advances in technology are sophisticated, we cannot ensure that it will always work on either your end or our end.  If the connection with a video visit is poor, the visit may have to be switched to a telephone visit.  With either a video or telephone visit, we are not always able to ensure that we have a secure connection.     I need to obtain your verbal consent now.   Are you willing to proceed with your visit today?    Kristen Powell has provided verbal consent on 05/29/2021 for a virtual visit (video or telephone).   Leeanne Rio, [place]   Date: 05/29/2021 12:36 PM   Virtual Visit via Video Note   I, Leeanne Rio, connected with Kristen Powell ([id number], June 29, 1946) on 05/29/21 at 12:15 PM EDT by a video-enabled telemedicine application and verified that I am speaking with the correct person using two identifiers.  Location: Patient: Virtual Visit Location Patient: Home Provider: Virtual Visit Location Provider: Home Office   I discussed the limitations of evaluation and  management by telemedicine and the availability of in person appointments. The patient expressed understanding and agreed to proceed.    History of Present Illness: Kristen Powell is a 75 y.o. who identifies as a female who was assigned female at birth, and is being seen today for + COVID 19 test. Patient endorses symptoms starting Tuesday with rhinorrhea, nasal congestion, mild cough and fatigue. Has had siblings with COVID that they had been around so she tested last night with a + result. Is having headache and body aches. Temperature has been in a normal range. O2 levels staying at 87%.   Problems:  Patient Active Problem List   Diagnosis Date Noted   B12 deficiency due to diet 04/29/2021   Screening for blood or protein in urine 04/29/2021   S/P partial hysterectomy- has ovaries.  04/29/2021   Chronic sciatica of right side 08/07/2019   Vitamin D deficiency 07/27/2016   Generalized anxiety disorder 08/13/2014   Degenerative arthritis of knee 05/14/2014   Essential hypertension 01/05/2014   HLD (hyperlipidemia) 01/05/2014   GERD (gastroesophageal reflux disease) 01/05/2014    Allergies:  Allergies  Allergen Reactions   Adhesive [Tape] Rash    redness   Medications:  Current Outpatient Medications:    benzonatate (TESSALON) 100 MG capsule, Take 1 capsule (100 mg total) by mouth 3 (three) times daily as needed for cough., Disp: 30 capsule, Rfl:  0   nirmatrelvir/ritonavir EUA (PAXLOVID) TABS, Take 3 tablets by mouth 2 (two) times daily for 5 days. (Take nirmatrelvir 150 mg two tablets twice daily for 5 days and ritonavir 100 mg one tablet twice daily for 5 days) Patient GFR is 61, Disp: 30 tablet, Rfl: 0   simvastatin (ZOCOR) 20 MG tablet, TAKE 1 TABLET BY MOUTH EVERY DAY AT 6PM, Disp: 90 tablet, Rfl: 2   acetaminophen (TYLENOL) 500 MG tablet, Take 1-2 tablets (500-1,000 mg total) by mouth every 4 (four) hours as needed for fever., Disp: 30 tablet, Rfl: 0   amLODipine (NORVASC) 10  MG tablet, Take 1 tablet (10 mg total) by mouth daily., Disp: 90 tablet, Rfl: 0   aspirin 81 MG tablet, Take 81 mg by mouth daily., Disp: , Rfl:    atenolol (TENORMIN) 25 MG tablet, Take 1 tablet (25 mg total) by mouth daily., Disp: 30 tablet, Rfl: 0   calcium carbonate (OS-CAL) 600 MG TABS tablet, Take 600 mg by mouth daily., Disp: , Rfl:    Cholecalciferol (VITAMIN D) 2000 UNITS CAPS, Take 1 capsule by mouth daily., Disp: , Rfl:    gabapentin (NEURONTIN) 100 MG capsule, TAKE 1 CAPSULE BY MOUTH AT BEDTIME., Disp: 30 capsule, Rfl: 1   ibuprofen (ADVIL,MOTRIN) 200 MG tablet, Take 200 mg by mouth as needed., Disp: , Rfl:    losartan (COZAAR) 50 MG tablet, Take 1 tablet (50 mg total) by mouth daily., Disp: 90 tablet, Rfl: 0   methylPREDNISolone (MEDROL DOSEPAK) 4 MG TBPK tablet, 6 day dose pack - take as directed, Disp: 21 tablet, Rfl: 0   Omega-3 Fatty Acids (FISH OIL) 1200 MG CAPS, Take 1 capsule by mouth 2 (two) times daily., Disp: , Rfl:    omeprazole (PRILOSEC) 20 MG capsule, TAKE 1 CAPSULE BY MOUTH TWICE A DAY, Disp: 180 capsule, Rfl: 0   venlafaxine XR (EFFEXOR-XR) 37.5 MG 24 hr capsule, TAKE 1 CAPSULE BY MOUTH DAILY WITH BREAKFAST., Disp: 90 capsule, Rfl: 0   vitamin B-12 (CYANOCOBALAMIN) 1000 MCG tablet, Take 1,000 mcg by mouth daily., Disp: , Rfl:   Current Facility-Administered Medications:    betamethasone acetate-betamethasone sodium phosphate (CELESTONE) injection 3 mg, 3 mg, Intra-articular, Once, Edrick Kins, DPM  Observations/Objective: Patient is well-developed, well-nourished in no acute distress.  Resting comfortably at home.  Head is normocephalic, atraumatic.  No labored breathing. Speech is clear and coherent with logical content.  Patient is alert and oriented at baseline.   Assessment and Plan: 1. COVID-19 - nirmatrelvir/ritonavir EUA (PAXLOVID) TABS; Take 3 tablets by mouth 2 (two) times daily for 5 days. (Take nirmatrelvir 150 mg two tablets twice daily for 5  days and ritonavir 100 mg one tablet twice daily for 5 days) Patient GFR is 61  Dispense: 30 tablet; Refill: 0 - benzonatate (TESSALON) 100 MG capsule; Take 1 capsule (100 mg total) by mouth 3 (three) times daily as needed for cough.  Dispense: 30 capsule; Refill: 0 - MyChart COVID-19 home monitoring program; Future Patient with advanced age and multiple medical issues.  COVID-positive with mild to moderate symptoms.  Is a candidate for antiviral therapy.  Discussed options with patient.  She has had a minimal Paxlovid as her siblings have been given this.  Chart reviewed and she has had a BMP within the past 5 weeks.  GFR greater than 60.  She is on simvastatin and was counseled that she would have to stop this medication while on Paxlovid for 5 additional days.  Patient states  last dose of simvastatin was yesterday so she is out of the window of holding medication before beginning antiviral.  Rx for Paxlovid sent to pharmacy with instructions.  Patient also enrolled in a MyChart symptom monitoring program.  She is to fill out these questionnaires daily so we can keep track of her symptoms.  Rx Tessalon for cough.  Supportive measures and OTC medications reviewed with patient.  Vitamin regimen reviewed.  Discussed with patient that given her age and health issues, have a low threshold for her seeking inpatient evaluation for any worsening symptoms.  She is to be seen at nearest ER for any chest pain or any significant shortness of breath.  Patient voiced understanding and agreement with the plan.  Follow Up Instructions: I discussed the assessment and treatment plan with the patient. The patient was provided an opportunity to ask questions and all were answered. The patient agreed with the plan and demonstrated an understanding of the instructions.  A copy of instructions were sent to the patient via MyChart.  The patient was advised to call back or seek an in-person evaluation if the symptoms worsen or if  the condition fails to improve as anticipated.  Time:  I spent 20 minutes with the patient via telehealth technology discussing the above problems/concerns.    Leeanne Rio, PA-C

## 2021-05-29 NOTE — Telephone Encounter (Signed)
Pt called in to access nurse at 7:27 pm stating that she tested positive for covid. She Is requesting the covid pack called in. Pt instructed to treat at home.

## 2021-05-29 NOTE — Telephone Encounter (Signed)
No appointment available with anyone in office available today or tomorrow

## 2021-05-30 NOTE — Telephone Encounter (Signed)
Pt called in asking for medication to be sent in after a visit with [facility] Provider. Patient was notified during the triage call that medication has been sent in.

## 2021-06-02 ENCOUNTER — Ambulatory Visit: Payer: Medicare Other | Admitting: Adult Health

## 2021-06-02 ENCOUNTER — Telehealth: Payer: Medicare Other | Admitting: Adult Health

## 2021-06-04 ENCOUNTER — Telehealth: Payer: Medicare Other | Admitting: Adult Health

## 2021-06-04 ENCOUNTER — Telehealth: Payer: Self-pay

## 2021-06-04 NOTE — Telephone Encounter (Signed)
Called and left a message to call our office to receive instructions to go to Urgent care. Patients provider is out on emergency pal and there is no availabilities in office. Patient kept appointment from last time I spoke with her. I had recommended she go to urgent care for her Covid symptoms. Patient was seen through Mychart Urgent video visits. See phone note from 05/29/2021.

## 2021-06-04 NOTE — Telephone Encounter (Addendum)
Spoke with Patient she is feeling much better reports no fever, Chills, body aches. Denies cough any longer or shortness of breath. Patient requested future in office for follow up on BP appointment scheduled. Patient was advised symptoms return or worsen to seek medical attention.

## 2021-06-17 ENCOUNTER — Other Ambulatory Visit: Payer: Self-pay | Admitting: Adult Health

## 2021-06-17 DIAGNOSIS — I10 Essential (primary) hypertension: Secondary | ICD-10-CM

## 2021-06-19 ENCOUNTER — Ambulatory Visit: Payer: Medicare Other | Admitting: Adult Health

## 2021-06-20 ENCOUNTER — Other Ambulatory Visit: Payer: Self-pay

## 2021-06-20 ENCOUNTER — Ambulatory Visit (INDEPENDENT_AMBULATORY_CARE_PROVIDER_SITE_OTHER): Payer: Medicare Other

## 2021-06-20 ENCOUNTER — Encounter: Payer: Self-pay | Admitting: Internal Medicine

## 2021-06-20 ENCOUNTER — Ambulatory Visit (INDEPENDENT_AMBULATORY_CARE_PROVIDER_SITE_OTHER): Payer: Medicare Other | Admitting: Internal Medicine

## 2021-06-20 VITALS — BP 160/80 | HR 71 | Temp 98.3°F | Ht 61.0 in | Wt 167.6 lb

## 2021-06-20 DIAGNOSIS — K219 Gastro-esophageal reflux disease without esophagitis: Secondary | ICD-10-CM

## 2021-06-20 DIAGNOSIS — G629 Polyneuropathy, unspecified: Secondary | ICD-10-CM | POA: Diagnosis not present

## 2021-06-20 DIAGNOSIS — F419 Anxiety disorder, unspecified: Secondary | ICD-10-CM

## 2021-06-20 DIAGNOSIS — M19071 Primary osteoarthritis, right ankle and foot: Secondary | ICD-10-CM | POA: Diagnosis not present

## 2021-06-20 DIAGNOSIS — I10 Essential (primary) hypertension: Secondary | ICD-10-CM | POA: Diagnosis not present

## 2021-06-20 DIAGNOSIS — N3000 Acute cystitis without hematuria: Secondary | ICD-10-CM | POA: Diagnosis not present

## 2021-06-20 DIAGNOSIS — M778 Other enthesopathies, not elsewhere classified: Secondary | ICD-10-CM | POA: Diagnosis not present

## 2021-06-20 MED ORDER — LOSARTAN POTASSIUM 100 MG PO TABS: 100.0000 mg | ORAL_TABLET | Freq: Every day | ORAL | 3 refills | Status: DC

## 2021-06-20 MED ORDER — CITALOPRAM HYDROBROMIDE 10 MG PO TABS: 10.0000 mg | ORAL_TABLET | Freq: Every day | ORAL | 3 refills | Status: DC

## 2021-06-20 MED ORDER — NEBIVOLOL HCL 5 MG PO TABS: 5.0000 mg | ORAL_TABLET | Freq: Every day | ORAL | 3 refills | Status: DC

## 2021-06-20 MED ORDER — OMEPRAZOLE 40 MG PO CPDR: 40.0000 mg | DELAYED_RELEASE_CAPSULE | Freq: Every day | ORAL | 3 refills | Status: DC

## 2021-06-20 MED ORDER — AMLODIPINE BESYLATE 5 MG PO TABS: 5.0000 mg | ORAL_TABLET | Freq: Every day | ORAL | 3 refills | Status: DC

## 2021-06-20 MED ORDER — GABAPENTIN 100 MG PO CAPS: 100.0000 mg | ORAL_CAPSULE | Freq: Every day | ORAL | 3 refills | Status: DC

## 2021-06-20 MED ORDER — LORAZEPAM 0.5 MG PO TABS: 0.2500 mg | ORAL_TABLET | Freq: Every day | ORAL | 2 refills | Status: DC | PRN

## 2021-06-20 NOTE — Patient Instructions (Addendum)
Taper off effexor 37.5 every other day x 1 week then stop  Add back ativan as needed  Add celexa after stopping effexor   Managing Anxiety, Adult After being diagnosed with an anxiety disorder, you may be relieved to know why you have felt or behaved a certain way. You may also feel overwhelmed about the treatment ahead and what it will mean for your life. With care and support, youcan manage this condition and recover from it. How to manage lifestyle changes Managing stress and anxiety  Stress is your body's reaction to life changes and events, both good and bad. Most stress will last just a few hours, but stress can be ongoing and can lead to more than just stress. Although stress can play a major role in anxiety, it is not the same as anxiety. Stress is usually caused by something external, such as a deadline, test, or competition. Stress normally passes after thetriggering event has ended.  Anxiety is caused by something internal, such as imagining a terrible outcome or worrying that something will go wrong that will devastate you. Anxiety often does not go away even after the triggering event is over, and it can become long-term (chronic) worry. It is important to understand the differences between stress and anxiety and to manage your stress effectively so that it does not lead to ananxious response. Talk with your health care provider or a counselor to learn more about reducing anxiety and stress. He or she may suggest tension reduction techniques, such as: Music therapy. This can include creating or listening to music that you enjoy and that inspires you. Mindfulness-based meditation. This involves being aware of your normal breaths while not trying to control your breathing. It can be done while sitting or walking. Centering prayer. This involves focusing on a word, phrase, or sacred image that means something to you and brings you peace. Deep breathing. To do this, expand your stomach and  inhale slowly through your nose. Hold your breath for 3-5 seconds. Then exhale slowly, letting your stomach muscles relax. Self-talk. This involves identifying thought patterns that lead to anxiety reactions and changing those patterns. Muscle relaxation. This involves tensing muscles and then relaxing them. Choose a tension reduction technique that suits your lifestyle and personality. These techniques take time and practice. Set aside 5-15 minutes a day to do them. Therapists can offer counseling and training in these techniques. The training to help with anxiety may be covered by some insurance plans. Other things you can do to manage stress and anxiety include: Keeping a stress/anxiety diary. This can help you learn what triggers your reaction and then learn ways to manage your response. Thinking about how you react to certain situations. You may not be able to control everything, but you can control your response. Making time for activities that help you relax and not feeling guilty about spending your time in this way. Visual imagery and yoga can help you stay calm and relax.  Medicines Medicines can help ease symptoms. Medicines for anxiety include: Anti-anxiety drugs. Antidepressants. Medicines are often used as a primary treatment for anxiety disorder. Medicines will be prescribed by a health care provider. When used together, medicines, psychotherapy, and tension reduction techniques may be the most effectivetreatment. Relationships Relationships can play a big part in helping you recover. Try to spend more time connecting with trusted friends and family members. Consider going to couples counseling, taking family education classes, or going to familytherapy. Therapy can help you and others better  understand your condition. How to recognize changes in your anxiety Everyone responds differently to treatment for anxiety. Recovery from anxiety happens when symptoms decrease and stop  interfering with your daily activities at home or work. This may mean that you will start to: Have better concentration and focus. Worry will interfere less in your daily thinking. Sleep better. Be less irritable. Have more energy. Have improved memory. It is important to recognize when your condition is getting worse. Contact your health care provider if your symptoms interfere with home or work and you feellike your condition is not improving. Follow these instructions at home: Activity Exercise. Most adults should do the following: Exercise for at least 150 minutes each week. The exercise should increase your heart rate and make you sweat (moderate-intensity exercise). Strengthening exercises at least twice a week. Get the right amount and quality of sleep. Most adults need 7-9 hours of sleep each night. Lifestyle  Eat a healthy diet that includes plenty of vegetables, fruits, whole grains, low-fat dairy products, and lean protein. Do not eat a lot of foods that are high in solid fats, added sugars, or salt. Make choices that simplify your life. Do not use any products that contain nicotine or tobacco, such as cigarettes, e-cigarettes, and chewing tobacco. If you need help quitting, ask your health care provider. Avoid caffeine, alcohol, and certain over-the-counter cold medicines. These may make you feel worse. Ask your pharmacist which medicines to avoid.  General instructions Take over-the-counter and prescription medicines only as told by your health care provider. Keep all follow-up visits as told by your health care provider. This is important. Where to find support You can get help and support from these sources: Self-help groups. Online and [employer]. A trusted spiritual leader. Couples counseling. Family education classes. Family therapy. Where to find more information You may find that joining a support group helps you deal with your anxiety. The following  sources can help you locate counselors or support groups near you: [place]: www.mentalhealth[place].net Anxiety and Depression Association of [place] (ADAA): https://www.clark.net/ National Alliance on Mental Illness (NAMI): www.nami.org Contact a health care provider if you: Have a hard time staying focused or finishing daily tasks. Spend many hours a day feeling worried about everyday life. Become exhausted by worry. Start to have headaches, feel tense, or have nausea. Urinate more than normal. Have diarrhea. Get help right away if you have: A racing heart and shortness of breath. Thoughts of hurting yourself or others. If you ever feel like you may hurt yourself or others, or have thoughts about taking your own life, get help right away. You can go to your nearest emergency department or call: Your local emergency services (911 in the [place] A suicide crisis helpline, such as the [facility] at 863-514-8283. This is open 24 hours a day. Summary Taking steps to learn and use tension reduction techniques can help calm you and help prevent triggering an anxiety reaction. When used together, medicines, psychotherapy, and tension reduction techniques may be the most effective treatment. Family, friends, and partners can play a big part in helping you recover from an anxiety disorder. This information is not intended to replace advice given to you by your health care provider. Make sure you discuss any questions you have with your healthcare provider. Document Revised: 05/02/2019 Document Reviewed: 05/02/2019 Elsevier Patient Education  [place].

## 2021-06-20 NOTE — Progress Notes (Signed)
Chief Complaint  Patient presents with   Follow-up    BP/ refills   F/u  1. HTN on norvasc 10 mg atenolol 25 mg losartan 50 mg qd she was on valsartan 160 mg qd now on losartan 50 dose was reduced from 100 during covid due to having elevated kidney #s   BP variable 146/56 HR 66, HR 160s-170s. HR in the 60s   2. Anxiety on effexor 37.5 mg qd disc could be causing elevated htn was on librium in the past  3. C/o numbness in toes right side and wants Rx gabapentin 100 mg qhs helps   4. C/o right foot pain seeing podiatry already but not had Xray   Review of Systems  Constitutional:  Negative for weight loss.  HENT:  Negative for hearing loss.   Eyes:  Negative for blurred vision.  Respiratory:  Negative for shortness of breath.   Cardiovascular:  Negative for chest pain.  Gastrointestinal:  Negative for abdominal pain.  Musculoskeletal:  Positive for joint pain.  Skin:  Negative for rash.  Neurological:  Negative for headaches.  Psychiatric/Behavioral:  Negative for depression.   Past Medical History:  Diagnosis Date   Anxiety    COVID-19    Hyperlipidemia    Hypertension    Psoriasis    younger age   Traumatic rupture of left ear drum    Past Surgical History:  Procedure Laterality Date   ABDOMINAL HYSTERECTOMY  12/14/1978   Partial   BASAL CELL CARCINOMA EXCISION  2006,2008,2011   BUNIONECTOMY     BUNIONECTOMY Bilateral 12/14/1990   KNEE ARTHROSCOPY     KNEE CLOSED REDUCTION  05/07/2015   Procedure: CLOSED MANIPULATION KNEE;  Surgeon: Hessie Knows, MD;  Location: [facility]S;  Service: Orthopedics;;   KNEE SURGERY Bilateral 2005,2006   Repair   MOHS SURGERY     nose bcc, left cheeck Dr. Delice Lesch f/u Q 12/2020   REPLACEMENT TOTAL KNEE Left 04/11/2015   manipulation--05/07/2015   ROTATOR CUFF REPAIR W/ DISTAL CLAVICLE EXCISION     SHOULDER SURGERY Right    x2   SKIN CANCER EXCISION     TOTAL KNEE ARTHROPLASTY     VAGINAL HYSTERECTOMY     Family History  Problem  Relation Age of Onset   Cancer Mother        lung   Hypertension Sister    Breast cancer Paternal Aunt    Heart attack Sister    Social History   Socioeconomic History   Marital status: Widowed    Spouse name: Not on file   Number of children: Not on file   Years of education: Not on file   Highest education level: Not on file  Occupational History   Not on file  Tobacco Use   Smoking status: Never   Smokeless tobacco: Never  Substance and Sexual Activity   Alcohol use: No   Drug use: No   Sexual activity: Never  Other Topics Concern   Not on file  Social History Narrative   Widowed x 12 years as of 06/2021    3 sisters and 1 brother    Has children    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   aspirin 81 MG tablet Take 81 mg by mouth daily.   calcium carbonate (OS-CAL) 600 MG TABS tablet Take 600 mg by mouth daily.   Cholecalciferol (VITAMIN D) 2000 UNITS CAPS Take 1 capsule by mouth daily.   citalopram (CELEXA) 10 MG tablet Take 1 tablet (10 mg total) by mouth daily. D/c effexor 37.5   LORazepam (ATIVAN) 0.5 MG tablet Take 0.5-1 tablets (0.25-0.5 mg total) by mouth daily as needed for anxiety.   nebivolol (BYSTOLIC) 5 MG tablet Take 1 tablet (5 mg total) by mouth daily.   Omega-3 Fatty Acids (FISH OIL) 1200 MG CAPS Take 1 capsule by mouth 2 (two) times daily.   simvastatin (ZOCOR) 20 MG tablet TAKE 1 TABLET BY MOUTH EVERY DAY AT 6PM   vitamin B-12 (CYANOCOBALAMIN) 1000 MCG tablet Take 1,000 mcg by mouth daily.   [DISCONTINUED] amLODipine (NORVASC) 10  MG tablet Take 1 tablet (10 mg total) by mouth daily.   [DISCONTINUED] atenolol (TENORMIN) 25 MG tablet Take 1 tablet (25 mg total) by mouth daily.   [DISCONTINUED] benzonatate (TESSALON) 100 MG capsule Take 1 capsule (100 mg total) by mouth 3 (three) times daily as needed for cough.   [DISCONTINUED] gabapentin (NEURONTIN) 100 MG capsule TAKE 1 CAPSULE BY MOUTH AT BEDTIME.   [DISCONTINUED] ibuprofen (ADVIL,MOTRIN) 200 MG tablet Take 200 mg by mouth as needed.   [DISCONTINUED] losartan (COZAAR) 50 MG tablet Take 1 tablet (50 mg total) by mouth daily.   [DISCONTINUED] methylPREDNISolone (MEDROL DOSEPAK) 4 MG TBPK tablet 6 day dose pack - take as directed   [DISCONTINUED] omeprazole (PRILOSEC) 20 MG capsule TAKE 1 CAPSULE BY MOUTH TWICE A DAY   [DISCONTINUED] venlafaxine XR (EFFEXOR-XR) 37.5 MG 24 hr capsule TAKE 1 CAPSULE BY MOUTH DAILY WITH BREAKFAST.   Current Facility-Administered Medications for the 06/20/21 encounter (Office Visit) with McLean-Scocuzza, Nino Glow, MD  Medication   betamethasone acetate-betamethasone sodium phosphate (CELESTONE) injection 3 mg   Allergies  Allergen Reactions   Adhesive [Tape] Rash    redness   Recent Results (from the past 2160 hour(s))  CBC with Differential/Platelet     Status: None   Collection Time: 04/29/21  9:40 AM  Result Value Ref Range   WBC 6.6 4.0 - 10.5 K/uL   RBC 3.87 3.87 - 5.11 Mil/uL   Hemoglobin 13.0 12.0 - 15.0 g/dL   HCT 37.5 36.0 - 46.0 %   MCV 96.8 78.0 - 100.0 fl   MCHC 34.8 30.0 - 36.0 g/dL   RDW 12.7 11.5 - 15.5 %   Platelets 225.0 150.0 - 400.0 K/uL   Neutrophils Relative % 65.9 43.0 - 77.0 %   Lymphocytes Relative 20.0 12.0 - 46.0 %   Monocytes Relative 11.7 3.0 - 12.0 %   Eosinophils Relative 2.1 0.0 - 5.0 %   Basophils Relative 0.3 0.0 - 3.0 %   Neutro Abs 4.3 1.4 - 7.7 K/uL   Lymphs Abs 1.3 0.7 - 4.0 K/uL   Monocytes Absolute 0.8 0.1 - 1.0 K/uL   Eosinophils Absolute 0.1 0.0 - 0.7 K/uL   Basophils Absolute 0.0 0.0 -  0.1 K/uL  Comprehensive metabolic panel     Status: Abnormal   Collection Time: 04/29/21  9:40 AM  Result Value Ref Range   Sodium 133 (L) 135 - 145 mEq/L   Potassium 4.8 3.5 -  5.1 mEq/L   Chloride 97 96 - 112 mEq/L   CO2 28 19 - 32 mEq/L   Glucose, Bld 93 70 - 99 mg/dL   BUN 19 6 - 23 mg/dL   Creatinine, Ser 0.91 0.40 - 1.20 mg/dL   Total Bilirubin 0.4 0.2 - 1.2 mg/dL   Alkaline Phosphatase 69 39 - 117 U/L   AST 20 0 - 37 U/L   ALT 22 0 - 35 U/L   Total Protein 7.1 6.0 - 8.3 g/dL   Albumin 4.2 3.5 - 5.2 g/dL   GFR 61.81 >60.00 mL/min    Comment: Calculated using the CKD-EPI Creatinine Equation (2021)   Calcium 9.2 8.4 - 10.5 mg/dL  TSH     Status: None   Collection Time: 04/29/21  9:40 AM  Result Value Ref Range   TSH 4.28 0.35 - 4.50 uIU/mL  Lipid panel     Status: Abnormal   Collection Time: 04/29/21  9:40 AM  Result Value Ref Range   Cholesterol 204 (H) 0 - 200 mg/dL    Comment: ATP III Classification       Desirable:  < 200 mg/dL               Borderline High:  200 - 239 mg/dL          High:  > = 240 mg/dL   Triglycerides 206.0 (H) 0.0 - 149.0 mg/dL    Comment: Normal:  <150 mg/dLBorderline High:  150 - 199 mg/dL   HDL 54.00 >39.00 mg/dL   VLDL 41.2 (H) 0.0 - 40.0 mg/dL   Total CHOL/HDL Ratio 4     Comment:                Men          Women1/2 Average Risk     3.4          3.3Average Risk          5.0          4.42X Average Risk          9.6          7.13X Average Risk          15.0          11.0                       NonHDL 150.13     Comment: NOTE:  Non-HDL goal should be 30 mg/dL higher than patient's LDL goal (i.e. LDL goal of < 70 mg/dL, would have non-HDL goal of < 100 mg/dL)  VITAMIN D 25 Hydroxy (Vit-D Deficiency, Fractures)     Status: None   Collection Time: 04/29/21  9:40 AM  Result Value Ref Range   VITD 47.40 30.00 - 100.00 ng/mL  B12     Status: Abnormal   Collection Time: 04/29/21  9:40 AM  Result Value Ref Range   Vitamin B-12 917 (H) 211 - 911 pg/mL   Urinalysis, Routine w reflex microscopic     Status: Abnormal   Collection Time: 04/29/21  9:40 AM  Result Value Ref Range   Color, Urine YELLOW Yellow;Lt. Yellow;Straw;Dark Yellow;Amber;Green;Red;Brown   APPearance Cloudy (A) Clear;Turbid;Slightly Cloudy;Cloudy   Specific Gravity, Urine 1.015 1.000 - 1.030   pH 7.0 5.0 - 8.0   Total Protein, Urine NEGATIVE Negative   Urine Glucose NEGATIVE Negative   Ketones, ur NEGATIVE Negative   Bilirubin Urine NEGATIVE Negative   Hgb urine  dipstick TRACE-INTACT (A) Negative   Urobilinogen, UA 0.2 0.0 - 1.0   Leukocytes,Ua LARGE (A) Negative   Nitrite POSITIVE (A) Negative   WBC, UA 21-50/hpf (A) 0-2/hpf   RBC / HPF 0-2/hpf 0-2/hpf   Squamous Epithelial / LPF Rare(0-4/hpf) Rare(0-4/hpf)   Bacteria, UA Many(>50/hpf) (A) None  LDL cholesterol, direct     Status: None   Collection Time: 04/29/21  9:40 AM  Result Value Ref Range   Direct LDL 123.0 mg/dL    Comment: Optimal:  <100 mg/dLNear or Above Optimal:  100-129 mg/dLBorderline High:  130-159 mg/dLHigh:  160-189 mg/dLVery High:  >190 mg/dL  Urine Culture     Status: Abnormal   Collection Time: 04/29/21  4:16 PM   Specimen: Urine  Result Value Ref Range   MICRO NUMBER: [id number]    SPECIMEN QUALITY: Adequate    Sample Source NOT GIVEN    STATUS: FINAL    ISOLATE 1: Escherichia coli (A)     Comment: Greater than 100,000 CFU/mL of Escherichia coli      Susceptibility   Escherichia coli - URINE CULTURE, REFLEX    AMOX/CLAVULANIC >=32 Resistant     AMPICILLIN >=32 Resistant     AMPICILLIN/SULBACTAM 16 Intermediate     CEFAZOLIN* >=64 Resistant      * For uncomplicated UTI caused by E. coli,K. pneumoniae or P. mirabilis: Cefazolin issusceptible if MIC <32 mcg/mL and predictssusceptible to the oral agents cefaclor, cefdinir,cefpodoxime, cefprozil, cefuroxime, cephalexinand loracarbef.    CEFEPIME <=1 Sensitive     CEFTRIAXONE <=1 Sensitive     CIPROFLOXACIN <=0.25 Sensitive     LEVOFLOXACIN  <=0.12 Sensitive     ERTAPENEM <=0.5 Sensitive     GENTAMICIN <=1 Sensitive     IMIPENEM <=0.25 Sensitive     NITROFURANTOIN <=16 Sensitive     PIP/TAZO <=4 Sensitive     TOBRAMYCIN <=1 Sensitive     TRIMETH/SULFA* <=20 Sensitive      * For uncomplicated UTI caused by E. coli,K. pneumoniae or P. mirabilis: Cefazolin issusceptible if MIC <32 mcg/mL and predictssusceptible to the oral agents cefaclor, cefdinir,cefpodoxime, cefprozil, cefuroxime, cephalexinand loracarbef.Legend:S = Susceptible  I = IntermediateR = Resistant  NS = Not susceptible* = Not tested  NR = Not reported**NN = See antimicrobic comments   Objective  Body mass index is 31.67 kg/m. Wt Readings from Last 3 Encounters:  06/20/21 167 lb 9.6 oz (76 kg)  04/29/21 168 lb 12.8 oz (76.6 kg)  10/15/20 166 lb (75.3 kg)   Temp Readings from Last 3 Encounters:  06/20/21 98.3 F (36.8 C) (Oral)  04/29/21 98.2 F (36.8 C)  10/15/20 (!) 96.9 F (36.1 C) (Temporal)   BP Readings from Last 3 Encounters:  06/20/21 (!) 160/80  04/29/21 (!) 160/84  10/15/20 (!) 166/88   Pulse Readings from Last 3 Encounters:  06/20/21 71  04/29/21 67  10/15/20 82    Physical Exam Vitals and nursing note reviewed.  Constitutional:      Appearance: Normal appearance. She is well-developed and well-groomed. She is obese.  HENT:     Head: Normocephalic and atraumatic.  Eyes:     Conjunctiva/sclera: Conjunctivae normal.     Pupils: Pupils are equal, round, and reactive to light.  Cardiovascular:     Rate and Rhythm: Normal rate and regular rhythm.     Heart sounds: Normal heart sounds. No murmur heard. Pulmonary:     Effort: Pulmonary effort is normal.     Breath sounds: Normal breath sounds.  Skin:  General: Skin is warm and dry.  Neurological:     General: No focal deficit present.     Mental Status: She is alert and oriented to person, place, and time. Mental status is at baseline.     Gait: Gait normal.  Psychiatric:         Attention and Perception: Attention and perception normal.        Mood and Affect: Mood and affect normal.        Speech: Speech normal.        Behavior: Behavior normal. Behavior is cooperative.        Thought Content: Thought content normal.        Cognition and Memory: Cognition and memory normal.        Judgment: Judgment normal.    Assessment  Plan  Hypertension, uncontrolled- Plan: losartan (COZAAR) 100 MG tablet increase from 50 mg qd , nebivolol (BYSTOLIC) 5 MG tablet stop atenolol 25 mg qd amLODipine (NORVASC) 5 MG tablet reduce from 10 mg qd due to leg edema F/u in 1-2 weeks Arthritis of right foot - Plan: DG Foot Complete Right, gabapentin (NEURONTIN) 100 MG capsule qhs  Gastroesophageal reflux disease without esophagitis - Plan: omeprazole (PRILOSEC) 40 MG capsule  Acute cystitis without hematuria - Plan: Urine Culture f/u UTI  Anxiety - Plan: citalopram (CELEXA) 10 MG tablet, LORazepam (ATIVAN) 0.5 MG tablet Stop effexor due to htn may raise BP  Neuropathy - Plan: gabapentin (NEURONTIN) 100 MG capsule qhs   Provider: Dr. Olivia Mackie McLean-Scocuzza-Internal Medicine

## 2021-06-21 LAB — URINE CULTURE
MICRO NUMBER:: 12097168
SPECIMEN QUALITY:: ADEQUATE

## 2021-07-04 ENCOUNTER — Encounter: Payer: Self-pay | Admitting: Internal Medicine

## 2021-07-04 ENCOUNTER — Other Ambulatory Visit: Payer: Self-pay

## 2021-07-04 ENCOUNTER — Ambulatory Visit (INDEPENDENT_AMBULATORY_CARE_PROVIDER_SITE_OTHER): Payer: Medicare Other | Admitting: Internal Medicine

## 2021-07-04 ENCOUNTER — Other Ambulatory Visit: Payer: Self-pay | Admitting: Family

## 2021-07-04 VITALS — BP 130/80 | HR 69 | Temp 98.0°F | Ht 61.0 in | Wt 171.0 lb

## 2021-07-04 DIAGNOSIS — I10 Essential (primary) hypertension: Secondary | ICD-10-CM | POA: Diagnosis not present

## 2021-07-04 DIAGNOSIS — U071 COVID-19: Secondary | ICD-10-CM

## 2021-07-04 DIAGNOSIS — I150 Renovascular hypertension: Secondary | ICD-10-CM

## 2021-07-04 DIAGNOSIS — I701 Atherosclerosis of renal artery: Secondary | ICD-10-CM

## 2021-07-04 DIAGNOSIS — E871 Hypo-osmolality and hyponatremia: Secondary | ICD-10-CM | POA: Diagnosis not present

## 2021-07-04 DIAGNOSIS — L237 Allergic contact dermatitis due to plants, except food: Secondary | ICD-10-CM

## 2021-07-04 DIAGNOSIS — R0683 Snoring: Secondary | ICD-10-CM

## 2021-07-04 DIAGNOSIS — F411 Generalized anxiety disorder: Secondary | ICD-10-CM

## 2021-07-04 MED ORDER — AMLODIPINE BESYLATE 5 MG PO TABS: 5.0000 mg | ORAL_TABLET | Freq: Two times a day (BID) | ORAL | 3 refills | Status: DC

## 2021-07-04 MED ORDER — CLOBETASOL PROPIONATE 0.05 % EX CREA: 1.0000 "application " | TOPICAL_CREAM | Freq: Two times a day (BID) | CUTANEOUS | 0 refills | Status: DC

## 2021-07-04 NOTE — Progress Notes (Addendum)
Chief Complaint  Patient presents with   Follow-up   Hypertension   Htn f/u  1. BP at home since 06/21/21 still 140s-180s/60s-80s hr mid 50s to 60s during sbp 180s stressful family issues she was on hctz 25 but h/o low sodium on norvasc 5 mg qd, bystolic 5 mg qd, losartan 100 mg qd  She is snoring at night and stopping breathing but declines sleep study for now will let me know  2. Poison ivy left lower leg tried otc meds w/o help    Review of Systems  HENT:  Negative for hearing loss.   Eyes:  Negative for blurred vision.  Respiratory:  Negative for shortness of breath.   Cardiovascular:  Negative for chest pain.  Musculoskeletal:  Negative for falls and joint pain.  Skin:  Positive for itching and rash.  Neurological:  Negative for dizziness and headaches.  Past Medical History:  Diagnosis Date   Anxiety    COVID-19    Hyperlipidemia    Hypertension    Psoriasis    younger age   Spinal stenosis    Traumatic rupture of left ear drum    Past Surgical History:  Procedure Laterality Date   ABDOMINAL HYSTERECTOMY  12/14/1978   Partial   BASAL CELL CARCINOMA EXCISION  2006,2008,2011   BUNIONECTOMY     BUNIONECTOMY Bilateral 12/14/1990   KNEE ARTHROSCOPY     KNEE CLOSED REDUCTION  05/07/2015   Procedure: CLOSED MANIPULATION KNEE;  Surgeon: Hessie Knows, MD;  Location: [facility] ORS;  Service: Orthopedics;;   KNEE SURGERY Bilateral 2005,2006   Repair   MOHS SURGERY     nose bcc, left cheeck Dr. Delice Lesch f/u Q 12/2020   REPLACEMENT TOTAL KNEE Left 04/11/2015   manipulation--05/07/2015   ROTATOR CUFF REPAIR W/ DISTAL CLAVICLE EXCISION     SHOULDER SURGERY Right    x2   SKIN CANCER EXCISION     TOTAL KNEE ARTHROPLASTY     VAGINAL HYSTERECTOMY     Family History  Problem Relation Age of Onset   Cancer Mother        lung   Hypertension Sister    Breast cancer Paternal Aunt    Heart attack Sister    Social History   Socioeconomic History   Marital status: Widowed    Spouse  name: Not on file   Number of children: Not on file   Years of education: Not on file   Highest education level: Not on file  Occupational History   Not on file  Tobacco Use   Smoking status: Never   Smokeless tobacco: Never  Substance and Sexual Activity   Alcohol use: No   Drug use: No   Sexual activity: Never  Other Topics Concern   Not on file  Social History Narrative   Widowed x 12 years as of 06/2021    3 sisters and 1 brother    Has children son age 27, daughter 81, son 11 as of 06/2021   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  aspirin 81 MG tablet Take 81 mg by mouth daily.   calcium carbonate (OS-CAL) 600 MG TABS tablet Take 600 mg by mouth daily.   Cholecalciferol (VITAMIN D) 2000 UNITS CAPS Take 1 capsule by mouth daily.   citalopram (CELEXA) 10 MG tablet Take 1 tablet (10 mg total) by mouth daily. D/c effexor 37.5   clobetasol cream (TEMOVATE) [id number] % Apply 1 application topically 2 (two) times daily. prn   gabapentin (NEURONTIN) 100 MG capsule Take 1 capsule (100 mg total) by mouth at bedtime.   LORazepam (ATIVAN) 0.5 MG tablet Take 0.5-1 tablets (0.25-0.5 mg total) by mouth daily as needed for anxiety.   losartan (COZAAR) 100 MG tablet Take 1 tablet (100 mg total) by mouth daily. D/c 50 mg   nebivolol (BYSTOLIC) 5 MG tablet Take 1 tablet (5 mg total) by mouth daily.   Omega-3 Fatty Acids (FISH OIL) 1200 MG CAPS Take 1 capsule by mouth 2 (two) times daily.   omeprazole (PRILOSEC) 40 MG capsule Take 1 capsule (40 mg total) by mouth daily. 30 min  before meal   simvastatin (ZOCOR) 20 MG tablet TAKE 1 TABLET BY MOUTH EVERY DAY AT 6PM   vitamin B-12 (CYANOCOBALAMIN) 1000 MCG tablet Take 1,000 mcg by mouth daily.   [DISCONTINUED] amLODipine (NORVASC) 5 MG tablet Take 1 tablet (5 mg total) by mouth daily. D/c 10 mg qd   Current Facility-Administered Medications for the 07/04/21 encounter (Office Visit) with McLean-Scocuzza, Nino Glow, MD  Medication   betamethasone acetate-betamethasone sodium phosphate (CELESTONE) injection 3 mg   Allergies  Allergen Reactions   Adhesive [Tape] Rash    redness   Recent Results (from the past 2160 hour(s))  CBC with Differential/Platelet     Status: None   Collection Time: 04/29/21  9:40 AM  Result Value Ref Range   WBC 6.6 4.0 - 10.5 K/uL   RBC 3.87 3.87 - 5.11 Mil/uL   Hemoglobin 13.0 12.0 - 15.0 g/dL   HCT 37.5 36.0 - 46.0 %   MCV 96.8 78.0 - 100.0 fl   MCHC 34.8 30.0 - 36.0 g/dL   RDW 12.7 11.5 - 15.5 %   Platelets 225.0 150.0 - 400.0 K/uL   Neutrophils Relative % 65.9 43.0 - 77.0 %   Lymphocytes Relative 20.0 12.0 - 46.0 %   Monocytes Relative 11.7 3.0 - 12.0 %   Eosinophils Relative 2.1 0.0 - 5.0 %   Basophils Relative 0.3 0.0 - 3.0 %   Neutro Abs 4.3 1.4 - 7.7 K/uL   Lymphs Abs 1.3 0.7 - 4.0 K/uL   Monocytes Absolute 0.8 0.1 - 1.0 K/uL   Eosinophils Absolute 0.1 0.0 - 0.7 K/uL   Basophils Absolute 0.0 0.0 - 0.1 K/uL  Comprehensive metabolic panel     Status: Abnormal   Collection Time: 04/29/21  9:40 AM  Result Value Ref Range   Sodium 133 (L) 135 - 145 mEq/L   Potassium 4.8 3.5 - 5.1 mEq/L   Chloride 97 96 - 112 mEq/L   CO2 28 19 - 32 mEq/L   Glucose, Bld 93 70 - 99 mg/dL   BUN 19 6 - 23 mg/dL   Creatinine, Ser 0.91 0.40 - 1.20 mg/dL   Total Bilirubin 0.4 0.2 - 1.2 mg/dL   Alkaline Phosphatase 69 39 - 117 U/L   AST 20 0 - 37 U/L   ALT 22 0 - 35 U/L   Total Protein 7.1 6.0 - 8.3 g/dL   Albumin 4.2 3.5 - 5.2 g/dL  GFR 61.81 >60.00 mL/min    Comment: Calculated using the  CKD-EPI Creatinine Equation (2021)   Calcium 9.2 8.4 - 10.5 mg/dL  TSH     Status: None   Collection Time: 04/29/21  9:40 AM  Result Value Ref Range   TSH 4.28 0.35 - 4.50 uIU/mL  Lipid panel     Status: Abnormal   Collection Time: 04/29/21  9:40 AM  Result Value Ref Range   Cholesterol 204 (H) 0 - 200 mg/dL    Comment: ATP III Classification       Desirable:  < 200 mg/dL               Borderline High:  200 - 239 mg/dL          High:  > = 240 mg/dL   Triglycerides 206.0 (H) 0.0 - 149.0 mg/dL    Comment: Normal:  <150 mg/dLBorderline High:  150 - 199 mg/dL   HDL 54.00 >39.00 mg/dL   VLDL 41.2 (H) 0.0 - 40.0 mg/dL   Total CHOL/HDL Ratio 4     Comment:                Men          Women1/2 Average Risk     3.4          3.3Average Risk          5.0          4.42X Average Risk          9.6          7.13X Average Risk          15.0          11.0                       NonHDL 150.13     Comment: NOTE:  Non-HDL goal should be 30 mg/dL higher than patient's LDL goal (i.e. LDL goal of < 70 mg/dL, would have non-HDL goal of < 100 mg/dL)  VITAMIN D 25 Hydroxy (Vit-D Deficiency, Fractures)     Status: None   Collection Time: 04/29/21  9:40 AM  Result Value Ref Range   VITD 47.40 30.00 - 100.00 ng/mL  B12     Status: Abnormal   Collection Time: 04/29/21  9:40 AM  Result Value Ref Range   Vitamin B-12 917 (H) 211 - 911 pg/mL  Urinalysis, Routine w reflex microscopic     Status: Abnormal   Collection Time: 04/29/21  9:40 AM  Result Value Ref Range   Color, Urine YELLOW Yellow;Lt. Yellow;Straw;Dark Yellow;Amber;Green;Red;Brown   APPearance Cloudy (A) Clear;Turbid;Slightly Cloudy;Cloudy   Specific Gravity, Urine 1.015 1.000 - 1.030   pH 7.0 5.0 - 8.0   Total Protein, Urine NEGATIVE Negative   Urine Glucose NEGATIVE Negative   Ketones, ur NEGATIVE Negative   Bilirubin Urine NEGATIVE Negative   Hgb urine dipstick TRACE-INTACT (A) Negative   Urobilinogen, UA 0.2 0.0 - 1.0   Leukocytes,Ua LARGE (A)  Negative   Nitrite POSITIVE (A) Negative   WBC, UA 21-50/hpf (A) 0-2/hpf   RBC / HPF 0-2/hpf 0-2/hpf   Squamous Epithelial / LPF Rare(0-4/hpf) Rare(0-4/hpf)   Bacteria, UA Many(>50/hpf) (A) None  LDL cholesterol, direct     Status: None   Collection Time: 04/29/21  9:40 AM  Result Value Ref Range   Direct LDL 123.0 mg/dL    Comment: Optimal:  <100 mg/dLNear or Above Optimal:  100-129 mg/dLBorderline  High:  130-159 mg/dLHigh:  160-189 mg/dLVery High:  >190 mg/dL  Urine Culture     Status: Abnormal   Collection Time: 04/29/21  4:16 PM   Specimen: Urine  Result Value Ref Range   MICRO NUMBER: [id number]    SPECIMEN QUALITY: Adequate    Sample Source NOT GIVEN    STATUS: FINAL    ISOLATE 1: Escherichia coli (A)     Comment: Greater than 100,000 CFU/mL of Escherichia coli      Susceptibility   Escherichia coli - URINE CULTURE, REFLEX    AMOX/CLAVULANIC >=32 Resistant     AMPICILLIN >=32 Resistant     AMPICILLIN/SULBACTAM 16 Intermediate     CEFAZOLIN* >=64 Resistant      * For uncomplicated UTI caused by E. coli,K. pneumoniae or P. mirabilis: Cefazolin issusceptible if MIC <32 mcg/mL and predictssusceptible to the oral agents cefaclor, cefdinir,cefpodoxime, cefprozil, cefuroxime, cephalexinand loracarbef.    CEFEPIME <=1 Sensitive     CEFTRIAXONE <=1 Sensitive     CIPROFLOXACIN <=0.25 Sensitive     LEVOFLOXACIN <=0.12 Sensitive     ERTAPENEM <=0.5 Sensitive     GENTAMICIN <=1 Sensitive     IMIPENEM <=0.25 Sensitive     NITROFURANTOIN <=16 Sensitive     PIP/TAZO <=4 Sensitive     TOBRAMYCIN <=1 Sensitive     TRIMETH/SULFA* <=20 Sensitive      * For uncomplicated UTI caused by E. coli,K. pneumoniae or P. mirabilis: Cefazolin issusceptible if MIC <32 mcg/mL and predictssusceptible to the oral agents cefaclor, cefdinir,cefpodoxime, cefprozil, cefuroxime, cephalexinand loracarbef.Legend:S = Susceptible  I = IntermediateR = Resistant  NS = Not susceptible* = Not tested  NR = Not  reported**NN = See antimicrobic comments  Urine Culture     Status: None   Collection Time: 06/20/21 12:14 PM   Specimen: Urine  Result Value Ref Range   MICRO NUMBER: [id number]    SPECIMEN QUALITY: Adequate    Sample Source URINE    STATUS: FINAL    Result:      Mixed genital flora isolated. These superficial bacteria are not indicative of a urinary tract infection. No further organism identification is warranted on this specimen. If clinically indicated, recollect clean-catch, mid-stream urine and transfer  immediately to Urine Culture Transport Tube.    Objective  Body mass index is 32.31 kg/m. Wt Readings from Last 3 Encounters:  07/04/21 171 lb (77.6 kg)  06/20/21 167 lb 9.6 oz (76 kg)  04/29/21 168 lb 12.8 oz (76.6 kg)   Temp Readings from Last 3 Encounters:  07/04/21 98 F (36.7 C) (Oral)  06/20/21 98.3 F (36.8 C) (Oral)  04/29/21 98.2 F (36.8 C)   BP Readings from Last 3 Encounters:  07/04/21 130/80  06/20/21 (!) 160/80  04/29/21 (!) 160/84   Pulse Readings from Last 3 Encounters:  07/04/21 69  06/20/21 71  04/29/21 67    Physical Exam Vitals and nursing note reviewed.  Constitutional:      Appearance: Normal appearance. She is well-developed and well-groomed.  HENT:     Head: Normocephalic and atraumatic.  Eyes:     Conjunctiva/sclera: Conjunctivae normal.     Pupils: Pupils are equal, round, and reactive to light.  Cardiovascular:     Rate and Rhythm: Normal rate and regular rhythm.     Heart sounds: Normal heart sounds. No murmur heard. Pulmonary:     Effort: Pulmonary effort is normal.     Breath sounds: Normal breath sounds.  Skin:    General:  Skin is warm and dry.  Neurological:     General: No focal deficit present.     Mental Status: She is alert and oriented to person, place, and time. Mental status is at baseline.     Gait: Gait normal.  Psychiatric:        Attention and Perception: Attention and perception normal.        Mood and  Affect: Mood and affect normal.        Speech: Speech normal.        Behavior: Behavior normal. Behavior is cooperative.        Thought Content: Thought content normal.        Cognition and Memory: Cognition and memory normal.        Judgment: Judgment normal.    Assessment  Plan  Hypertension, unspecified type - Plan: 07/04/21 amLODipine (NORVASC) 5 MG tablet bid, bystolic 5 mg qhs, losartan 100 in am  Consider sleep study and RAS [place] w/u but insurance will not cover will call about this  Rtc 07/10/21 bring BP cuff to compare and check BP  07/07/21 BP log review BP 149/58 HR 70, 7/26 BP 149/71 HR 60, 7/27 BP 162/70 HR 63   H/o hypona avoid hctz if add spironolactone 12.5 mg will need bmet check in 1-2 weeks consider adding this bp better on diuretics in the past With hyponatremia do CXR to look up other causes other than diuretics   As of 07/12/21 Dr. Derrel Nip increased bystolic to 10 mg daily   Snoring Consider sleep study pt will call back when ready for referral to pulm  Poison ivy dermatitis - Plan: clobetasol cream (TEMOVATE) 0.05 %   Never had colonoscopy Considering will call back when ready  Provider: Dr. Olivia Mackie McLean-Scocuzza-Internal Medicine

## 2021-07-04 NOTE — Patient Instructions (Addendum)
Let us know when ready for colonoscopy  Consider spironolactone 12.5 low dose in the future  Clobetasol cream to left lower leg   Colonoscopy, Adult A colonoscopy is a procedure to look at the entire large intestine. This procedure is done using a long, thin, flexible tube that has a camera on theend. You may have a colonoscopy: As a part of normal colorectal screening. If you have certain symptoms, such as: A low number of red blood cells in your blood (anemia). Diarrhea that does not go away. Pain in your abdomen. Blood in your stool. A colonoscopy can help screen for and diagnose medical problems, including: Tumors. Extra tissue that grows where mucus forms (polyps). Inflammation. Areas of bleeding. Tell your health care provider about: Any allergies you have. All medicines you are taking, including vitamins, herbs, eye drops, creams, and over-the-counter medicines. Any problems you or family members have had with anesthetic medicines. Any blood disorders you have. Any surgeries you have had. Any medical conditions you have. Any problems you have had with having bowel movements. Whether you are pregnant or may be pregnant. What are the risks? Generally, this is a safe procedure. However, problems may occur, including: Bleeding. Damage to your intestine. Allergic reactions to medicines given during the procedure. Infection. This is rare. What happens before the procedure? Eating and drinking restrictions Follow instructions from your health care provider about eating or drinking restrictions, which may include: A few days before the procedure: Follow a low-fiber diet. Avoid nuts, seeds, dried fruit, raw fruits, and vegetables. 1-3 days before the procedure: Eat only gelatin dessert or ice pops. Drink only clear liquids, such as water, clear juice, clear broth or bouillon, black coffee or tea, or clear soft drinks or sports drinks. Avoid liquids that contain red or purple  dye. The day of the procedure: Do not eat solid foods. You may continue to drink clear liquids until up to 2 hours before the procedure. Do not eat or drink anything starting 2 hours before the procedure, or within the time period that your health care provider recommends. Bowel prep If you were prescribed a bowel prep to take by mouth (orally) to clean out your colon: Take it as told by your health care provider. Starting the day before your procedure, you will need to drink a large amount of liquid medicine. The liquid will cause you to have many bowel movements of loose stool until your stool becomes almost clear or light green. If your skin or the opening between the buttocks (anus) gets irritated from diarrhea, you may relieve the irritation using: Wipes with medicine in them, such as adult wet wipes with aloe and vitamin E. A product to soothe skin, such as petroleum jelly. If you vomit while drinking the bowel prep: Take a break for up to 60 minutes. Begin the bowel prep again. Call your health care provider if you keep vomiting or you cannot take the bowel prep without vomiting. To clean out your colon, you may also be given: Laxative medicines. These help you have a bowel movement. Instructions for enema use. An enema is liquid medicine injected into your rectum. Medicines Ask your health care provider about: Changing or stopping your regular medicines or supplements. This is especially important if you are taking iron supplements, diabetes medicines, or blood thinners. Taking medicines such as aspirin and ibuprofen. These medicines can thin your blood. Do not take these medicines unless your health care provider tells you to take them. Taking over-the-counter  medicines, vitamins, herbs, and supplements. General instructions Ask your health care provider what steps will be taken to help prevent infection. These may include washing skin with a germ-killing soap. Plan to have someone  take you home from the hospital or clinic. What happens during the procedure?  An IV will be inserted into one of your veins. You may be given one or more of the following: A medicine to help you relax (sedative). A medicine to numb the area (local anesthetic). A medicine to make you fall asleep (general anesthetic). This is rarely needed. You will lie on your side with your knees bent. The tube will: Have oil or gel put on it (be lubricated). Be inserted into your anus. Be gently eased through all parts of your large intestine. Air will be sent into your colon to keep it open. This may cause some pressure or cramping. Images will be taken with the camera and will appear on a screen. A small tissue sample may be removed to be looked at under a microscope (biopsy). The tissue may be sent to a lab for testing if any signs of problems are found. If small polyps are found, they may be removed and checked for cancer cells. When the procedure is finished, the tube will be removed. The procedure may vary among health care providers and hospitals. What happens after the procedure? Your blood pressure, heart rate, breathing rate, and blood oxygen level will be monitored until you leave the hospital or clinic. You may have a small amount of blood in your stool. You may pass gas and have mild cramping or bloating in your abdomen. This is caused by the air that was used to open your colon during the exam. Do not drive for 24 hours after the procedure. It is up to you to get the results of your procedure. Ask your health care provider, or the department that is doing the procedure, when your results will be ready. Summary A colonoscopy is a procedure to look at the entire large intestine. Follow instructions from your health care provider about eating and drinking before the procedure. If you were prescribed an oral bowel prep to clean out your colon, take it as told by your health care  provider. During the colonoscopy, a flexible tube with a camera on its end is inserted into the anus and then passed into the other parts of the large intestine. This information is not intended to replace advice given to you by your health care provider. Make sure you discuss any questions you have with your healthcare provider. Document Revised: 06/23/2019 Document Reviewed: 06/23/2019 Elsevier Patient Education  [place].  Sleep Apnea Sleep apnea is a condition in which breathing pauses or becomes shallow during sleep. People with sleep apnea usually snore loudly. They may have times when they gasp and stop breathing for 10 seconds or more during sleep. This mayhappen many times during the night. Sleep apnea disrupts your sleep and keeps your body from getting the rest that it needs. This condition can increase your risk of certain health problems, including: Heart attack. Stroke. Obesity. Type 2 diabetes. Heart failure. Irregular heartbeat. High blood pressure. The goal of treatment is to help you breathe normally again. What are the causes? The most common cause of sleep apnea is a collapsed or blocked airway. There are three kinds of sleep apnea: Obstructive sleep apnea. This kind is caused by a blocked or collapsed airway. Central sleep apnea. This kind happens when  the part of the brain that controls breathing does not send the correct signals to the muscles that control breathing. Mixed sleep apnea. This is a combination of obstructive and central sleep apnea. What increases the risk? You are more likely to develop this condition if you: Are overweight. Smoke. Have a smaller than normal airway. Are older. Are female. Drink alcohol. Take sedatives or tranquilizers. Have a family history of sleep apnea. Have a tongue or tonsils that are larger than normal. What are the signs or symptoms? Symptoms of this condition include: Trouble staying asleep. Loud  snoring. Morning headaches. Waking up gasping. Dry mouth or sore throat in the morning. Daytime sleepiness and tiredness. If you have daytime fatigue because of sleep apnea, you may be more likely to have: Trouble concentrating. Forgetfulness. Irritability or mood swings. Personality changes. Feelings of depression. Sexual dysfunction. This may include loss of interest if you are female, or erectile dysfunction if you are female. How is this diagnosed? This condition may be diagnosed with: A medical history. A physical exam. A series of tests that are done while you are sleeping (sleep study). These tests are usually done in a sleep lab, but they may also be done at home. How is this treated? Treatment for this condition aims to restore normal breathing and to ease symptoms during sleep. It may involve managing health issues that can affect breathing, such as high blood pressure or obesity. Treatment may include: Sleeping on your side. Using a decongestant if you have nasal congestion. Avoiding the use of depressants, including alcohol, sedatives, and narcotics. Losing weight if you are overweight. Making changes to your diet. Quitting smoking. Using a device to open your airway while you sleep, such as: An oral appliance. This is a custom-made mouthpiece that shifts your lower jaw forward. A continuous positive airway pressure (CPAP) device. This device blows air through a mask when you breathe out (exhale). A nasal expiratory positive airway pressure (EPAP) device. This device has valves that you put into each nostril. A bi-level positive airway pressure (BPAP) device. This device blows air through a mask when you breathe in (inhale) and breathe out (exhale). Having surgery if other treatments do not work. During surgery, excess tissue is removed to create a wider airway. Follow these instructions at home: Lifestyle Make any lifestyle changes that your health care provider  recommends. Eat a healthy, well-balanced diet. Take steps to lose weight if you are overweight. Avoid using depressants, including alcohol, sedatives, and narcotics. Do not use any products that contain nicotine or tobacco. These products include cigarettes, chewing tobacco, and vaping devices, such as e-cigarettes. If you need help quitting, ask your health care provider. General instructions Take over-the-counter and prescription medicines only as told by your health care provider. If you were given a device to open your airway while you sleep, use it only as told by your health care provider. If you are having surgery, make sure to tell your health care provider you have sleep apnea. You may need to bring your device with you. Keep all follow-up visits. This is important. Contact a health care provider if: The device that you received to open your airway during sleep is uncomfortable or does not seem to be working. Your symptoms do not improve. Your symptoms get worse. Get help right away if: You develop: Chest pain. Shortness of breath. Discomfort in your back, arms, or stomach. You have: Trouble speaking. Weakness on one side of your body. Drooping in your face.  These symptoms may represent a serious problem that is an emergency. Do not wait to see if the symptoms will go away. Get medical help right away. Call your local emergency services (911 in the [place] Do not drive yourself to the hospital. Summary Sleep apnea is a condition in which breathing pauses or becomes shallow during sleep. The most common cause is a collapsed or blocked airway. The goal of treatment is to restore normal breathing and to ease symptoms during sleep. This information is not intended to replace advice given to you by your health care provider. Make sure you discuss any questions you have with your healthcare provider. Document Revised: 11/08/2020 Document Reviewed: 11/08/2020 Elsevier Patient Education   2022 [place].  Sleep Study, Adult A sleep study (polysomnogram) is a series of tests done while you are sleeping. A sleep study records your brain waves, heart rate, breathing rate, oxygen level, and eye and legmovements. A sleep study helps your health care provider: See how well you sleep. Diagnose a sleep disorder. Determine how severe your sleep disorder is. Create a plan to treat your sleep disorder. Your health care provider may recommend a sleep study if you: Feel sleepy on most days. Snore loudly while sleeping. Have unusual behaviors while you sleep, such as walking. Have brief periods in which you stop breathing during sleep (sleepapnea). Fall asleep suddenly during the day (narcolepsy). Have trouble falling asleep or staying asleep (insomnia). Feel like you need to move your legs when trying to fall asleep (restless legs syndrome). Move your legs by flexing and extending them regularly while asleep (periodic limb movement disorder). Act out your dreams while you sleep (sleep behavior disorder). Feel like you cannot move when you first wake up (sleep paralysis). What tests are part of a sleep study? Most sleep studies record the following during sleep: Brain activity. Eye movements. Heart rate and rhythm. Breathing rate and rhythm. Blood-oxygen level. Blood pressure. Chest and belly movement as you breathe. Arm and leg movements. Snoring or other noises. Body position. Where are sleep studies done? Sleep studies are done at sleep centers. A sleep center may be inside [place], office, or clinic. The room where you have the study may look like a hospital room or a hotel room. The health care providers doing the study may come in and out of the room during the study. Most of the time, they will be in another room monitoringyour test as you sleep. How are sleep studies done? Most sleep studies are done during a normal period of time for a full night of sleep. You  will arrive at the study center in the evening and go home in themorning. Before the test Bring your pajamas and toothbrush with you to the sleep study. Do not have caffeine on the day of your sleep study. Do not drink alcohol on the day of your sleep study. Your health care provider will let you know if you should stop taking any of your regular medicines before the test. During the test     Round, sticky patches with sensors attached to recording wires (electrodes) are placed on your scalp, face, chest, and limbs. Wires from all the electrodes and sensors run from your bed to a computer. The wires can be taken off and put back on if you need to get out of bed to go to the bathroom. A sensor is placed over your nose to measure airflow. A finger clip is put on your finger or ear to  measure your blood oxygen level (pulse oximetry). A belt is placed around your belly and a belt is placed around your chest to measure breathing movements. If you have signs of the sleep disorder called sleep apnea during your test, you may get a treatment mask to wear for the second half of the night. The mask provides positive airway pressure (PAP) to help you breathe better during sleep. This may greatly improve your sleep apnea. You will then have all tests done again with the mask in place to see if your measurements and recordings change. After the test A medical doctor who specializes in sleep will evaluate the results of your sleep study and share them with you and your primary health care provider. Based on your results, your medical history, and a physical exam, you may be diagnosed with a sleep disorder, such as: Sleep apnea. Restless legs syndrome. Sleep-related behavior disorder. Sleep-related movement disorders. Sleep-related seizure disorders. Your health care team will help determine your treatment options based on your diagnosis. This may include: Improving your sleep habits (sleep  hygiene). Wearing a continuous positive airway pressure (CPAP) or bi-level positive airway pressure (BPAP) mask. Wearing an oral device at night to improve breathing and reduce snoring. Taking medicines. Follow these instructions at home: Take over-the-counter and prescription medicines only as told by your health care provider. If you are instructed to use a CPAP or BPAP mask, make sure you use it nightly as directed. Make any lifestyle changes that your health care provider recommends. If you were given a device to open your airway while you sleep, use it only as told by your health care provider. Do not use any tobacco products, such as cigarettes, chewing tobacco, and e-cigarettes. If you need help quitting, ask your health care provider. Keep all follow-up visits as told by your health care provider. This is important. Summary A sleep study (polysomnogram) is a series of tests done while you are sleeping. It shows how well you sleep. Most sleep studies are done over one full night of sleep. You will arrive at the study center in the evening and go home in the morning. If you have signs of the sleep disorder called sleep apnea during your test, you may get a treatment mask to wear for the second half of the night. A medical doctor who specializes in sleep will evaluate the results of your sleep study and share them with your primary health care provider. This information is not intended to replace advice given to you by your health care provider. Make sure you discuss any questions you have with your healthcare provider. Document Revised: 01/05/2020 Document Reviewed: 12/28/2017 Elsevier Patient Education  2022 [place].  Hypertension, Adult High blood pressure (hypertension) is when the force of blood pumping through the arteries is too strong. The arteries are the blood vessels that carry blood from the heart throughout the body. Hypertension forces the heart to work harder to pump  blood and may cause arteries to become narrow or stiff. Untreated or uncontrolled hypertension can cause a heart attack, heart failure, a stroke, kidney disease, and otherproblems. A blood pressure reading consists of a higher number over a lower number. Ideally, your blood pressure should be below 120/80. The first ("top") number is called the systolic pressure. It is a measure of the pressure in your arteries as your heart beats. The second ("bottom") number is called the diastolic pressure. It is a measure of the pressure in your arteries as theheart relaxes.  What are the causes? The exact cause of this condition is not known. There are some conditions thatresult in or are related to high blood pressure. What increases the risk? Some risk factors for high blood pressure are under your control. The following factors may make you more likely to develop this condition: Smoking. Having type 2 diabetes mellitus, high cholesterol, or both. Not getting enough exercise or physical activity. Being overweight. Having too much fat, sugar, calories, or salt (sodium) in your diet. Drinking too much alcohol. Some risk factors for high blood pressure may be difficult or impossible to change. Some of these factors include: Having chronic kidney disease. Having a family history of high blood pressure. Age. Risk increases with age. Race. You may be at higher risk if you are African American. Gender. Men are at higher risk than women before age 71. After age 73, women are at higher risk than men. Having obstructive sleep apnea. Stress. What are the signs or symptoms? High blood pressure may not cause symptoms. Very high blood pressure (hypertensive crisis) may cause: Headache. Anxiety. Shortness of breath. Nosebleed. Nausea and vomiting. Vision changes. Severe chest pain. Seizures. How is this diagnosed? This condition is diagnosed by measuring your blood pressure while you are seated, with your arm  resting on a flat surface, your legs uncrossed, and your feet flat on the floor. The cuff of the blood pressure monitor will be placed directly against the skin of your upper arm at the level of your heart. It should be measured at least twice using the same arm. Certain conditions cancause a difference in blood pressure between your right and left arms. Certain factors can cause blood pressure readings to be lower or higher than normal for a short period of time: When your blood pressure is higher when you are in a health care provider's office than when you are at home, this is called white coat hypertension. Most people with this condition do not need medicines. When your blood pressure is higher at home than when you are in a health care provider's office, this is called masked hypertension. Most people with this condition may need medicines to control blood pressure. If you have a high blood pressure reading during one visit or you have normal blood pressure with other risk factors, you may be asked to: Return on a different day to have your blood pressure checked again. Monitor your blood pressure at home for 1 week or longer. If you are diagnosed with hypertension, you may have other blood or imaging tests to help your health care provider understand your overall risk for otherconditions. How is this treated? This condition is treated by making healthy lifestyle changes, such as eating healthy foods, exercising more, and reducing your alcohol intake. Your health care provider may prescribe medicine if lifestyle changes are not enough to get your blood pressure under control, and if: Your systolic blood pressure is above 130. Your diastolic blood pressure is above 80. Your personal target blood pressure may vary depending on your medicalconditions, your age, and other factors. Follow these instructions at home: Eating and drinking  Eat a diet that is high in fiber and potassium, and low in  sodium, added sugar, and fat. An example eating plan is called the DASH (Dietary Approaches to Stop Hypertension) diet. To eat this way: Eat plenty of fresh fruits and vegetables. Try to fill one half of your plate at each meal with fruits and vegetables. Eat whole grains, such as whole-wheat  pasta, brown rice, or whole-grain bread. Fill about one fourth of your plate with whole grains. Eat or drink low-fat dairy products, such as skim milk or low-fat yogurt. Avoid fatty cuts of meat, processed or cured meats, and poultry with skin. Fill about one fourth of your plate with lean proteins, such as fish, chicken without skin, beans, eggs, or tofu. Avoid pre-made and processed foods. These tend to be higher in sodium, added sugar, and fat. Reduce your daily sodium intake. Most people with hypertension should eat less than 1,500 mg of sodium a day. Do not drink alcohol if: Your health care provider tells you not to drink. You are pregnant, may be pregnant, or are planning to become pregnant. If you drink alcohol: Limit how much you use to: 0-1 drink a day for women. 0-2 drinks a day for men. Be aware of how much alcohol is in your drink. In the [place] one drink equals one 12 oz bottle of beer (355 mL), one 5 oz glass of wine (148 mL), or one 1 oz glass of hard liquor (44 mL).  Lifestyle  Work with your health care provider to maintain a healthy body weight or to lose weight. Ask what an ideal weight is for you. Get at least 30 minutes of exercise most days of the week. Activities may include walking, swimming, or biking. Include exercise to strengthen your muscles (resistance exercise), such as Pilates or lifting weights, as part of your weekly exercise routine. Try to do these types of exercises for 30 minutes at least 3 days a week. Do not use any products that contain nicotine or tobacco, such as cigarettes, e-cigarettes, and chewing tobacco. If you need help quitting, ask your health care  provider. Monitor your blood pressure at home as told by your health care provider. Keep all follow-up visits as told by your health care provider. This is important.  Medicines Take over-the-counter and prescription medicines only as told by your health care provider. Follow directions carefully. Blood pressure medicines must be taken as prescribed. Do not skip doses of blood pressure medicine. Doing this puts you at risk for problems and can make the medicine less effective. Ask your health care provider about side effects or reactions to medicines that you should watch for. Contact a health care provider if you: Think you are having a reaction to a medicine you are taking. Have headaches that keep coming back (recurring). Feel dizzy. Have swelling in your ankles. Have trouble with your vision. Get help right away if you: Develop a severe headache or confusion. Have unusual weakness or numbness. Feel faint. Have severe pain in your chest or abdomen. Vomit repeatedly. Have trouble breathing. Summary Hypertension is when the force of blood pumping through your arteries is too strong. If this condition is not controlled, it may put you at risk for serious complications. Your personal target blood pressure may vary depending on your medical conditions, your age, and other factors. For most people, a normal blood pressure is less than 120/80. Hypertension is treated with lifestyle changes, medicines, or a combination of both. Lifestyle changes include losing weight, eating a healthy, low-sodium diet, exercising more, and limiting alcohol. This information is not intended to replace advice given to you by your health care provider. Make sure you discuss any questions you have with your healthcare provider. Document Revised: 08/10/2018 Document Reviewed: 08/10/2018 Elsevier Patient Education  [place].

## 2021-07-09 ENCOUNTER — Ambulatory Visit (INDEPENDENT_AMBULATORY_CARE_PROVIDER_SITE_OTHER): Payer: Medicare Other

## 2021-07-09 ENCOUNTER — Other Ambulatory Visit: Payer: Self-pay

## 2021-07-09 VITALS — BP 144/76 | HR 66 | Wt 169.2 lb

## 2021-07-09 DIAGNOSIS — I10 Essential (primary) hypertension: Secondary | ICD-10-CM

## 2021-07-09 NOTE — Progress Notes (Addendum)
Patient here for nurse visit BP check per order from 07/04/21.   Patient reports compliance with prescribed BP medications: yes, losartan 100 mg, amlodipine '5mg'$  BID, Bystolic 5 mg  Last dose of BP medication: 7:00 am  BP Readings from Last 3 Encounters:  07/09/21 (!) 144/76  07/04/21 130/80  06/20/21 (!) 160/80   Pulse Readings from Last 3 Encounters:  07/09/21 66  07/04/21 69  06/20/21 71    Patient came in for BP check. Patient BP checked manually 144/76. Patient brought in BP machines to compare with manual check. Patient checked with electronic wrist monitor 197/77 and electronic arm monitor 160/72. Rechecked manually 154/70. Patient was shown how to use and put on the electronic arm BP machine which is more accurate.   Patient verbalized understanding of instructions.   Baron Hamper, CMA   BP elevated and home electronic arm instrument is measuring 5 pts higher  than office reading. Increase bystolic dose to 10 mg daily ,  new rx sent

## 2021-07-11 ENCOUNTER — Ambulatory Visit: Payer: Medicare Other

## 2021-07-13 ENCOUNTER — Telehealth: Payer: Self-pay | Admitting: Internal Medicine

## 2021-07-13 DIAGNOSIS — M5431 Sciatica, right side: Secondary | ICD-10-CM

## 2021-07-13 DIAGNOSIS — I10 Essential (primary) hypertension: Secondary | ICD-10-CM

## 2021-07-13 MED ORDER — NEBIVOLOL HCL 10 MG PO TABS: 10.0000 mg | ORAL_TABLET | Freq: Every day | ORAL | 1 refills | Status: DC

## 2021-07-13 NOTE — Assessment & Plan Note (Signed)
Still elevated per RN visit on July 27  despite stopping Effexor (Effexor dc'd per [place] on July 8.) advising patient to increase bystolic to 10 mg daily and continue 100 mg losartan and 5 mg amlodipine BID )

## 2021-07-13 NOTE — Assessment & Plan Note (Signed)
Therapy changed from venlafaxine to citalopram on July 8 by [place] due to concern for effect on BP.  Repeat BP check after 3 weeks still elevated;  Will discuss with patient which medication improved her mood

## 2021-07-13 NOTE — Telephone Encounter (Signed)
I just refilled the effexor but after 5 minutes of seraching in the chart,  I discovered that it was dc'd  by [place] on July 8  because of hypertension and citalopram was started.    Please call patient and find out why she requested effexor.  Is it because her BP was high on her recent RN visit and so she wants to go back on it and stop the celexa?    Also she needs to increase Bystolic to 10 mg daily for Bloodpressure.  I was unable to forward the nurse's BP check not back to you with this comment so I am telling you now

## 2021-07-14 NOTE — Telephone Encounter (Signed)
Placed call to pt to inform her of medication change. Pt states she's been doing good on her current medications she's on for BP. Pt states she will call office back with her BP reading for today once she eats breakfast. Pt was on vacation so he hasn't been keeping up with readings.

## 2021-07-14 NOTE — Telephone Encounter (Signed)
Patient called back with her blood pressure reading for this morning,it was 143/57 and her pulse was 66.

## 2021-07-14 NOTE — Addendum Note (Signed)
Addended by: Orland Mustard on: 07/14/2021 08:17 AM   Modules accepted: Orders

## 2021-07-14 NOTE — Addendum Note (Signed)
Addended by: Orland Mustard on: 07/14/2021 12:22 AM   Modules accepted: Orders

## 2021-07-15 NOTE — Telephone Encounter (Signed)
Placed call to pt to schedule her a nurse visit for a recheck BP in 3 weeks . LMTCB

## 2021-08-05 ENCOUNTER — Other Ambulatory Visit: Payer: Self-pay

## 2021-08-05 ENCOUNTER — Ambulatory Visit (INDEPENDENT_AMBULATORY_CARE_PROVIDER_SITE_OTHER): Payer: Medicare Other

## 2021-08-05 VITALS — BP 172/72 | HR 65 | Ht 60.98 in | Wt 171.2 lb

## 2021-08-05 DIAGNOSIS — I10 Essential (primary) hypertension: Secondary | ICD-10-CM | POA: Diagnosis not present

## 2021-08-05 DIAGNOSIS — Z0130 Encounter for examination of blood pressure without abnormal findings: Secondary | ICD-10-CM

## 2021-08-05 LAB — BASIC METABOLIC PANEL
BUN: 15 mg/dL (ref 6–23)
CO2: 27 mEq/L (ref 19–32)
Calcium: 9.3 mg/dL (ref 8.4–10.5)
Chloride: 97 mEq/L (ref 96–112)
Creatinine, Ser: 0.94 mg/dL (ref 0.40–1.20)
GFR: 59.34 mL/min — ABNORMAL LOW (ref >60.00)
Glucose, Bld: 80 mg/dL (ref 70–99)
Potassium: 4.3 mEq/L (ref 3.5–5.1)
Sodium: 132 mEq/L — ABNORMAL LOW (ref 135–145)

## 2021-08-05 NOTE — Progress Notes (Addendum)
Patient here for nurse visit BP check per order from Dr. Derrel Nip. See orders from 07/13/21 assessment & plan note..    Patient reports compliance with prescribed BP medications: yes.   BP Medication the patient is taking: Losartan, Nebivolol, amlodipine.  Last dose of BP medication: Losartan and Amlodipine 08/05/21 at 7am  Amlodipine and Nebivolol 08/04/21 at 10:20pm BP Readings from Last 3 Encounters:  08/05/21 (!) 172/72  07/09/21 (!) 144/76  07/04/21 130/80   Pulse Readings from Last 3 Encounters:  08/05/21 65  07/09/21 66  07/04/21 69    Any Headaches? Dizziness? Light Headedness? No. Pt voiced no complaints or concerns.  Pt states that she is stressed due to having a Ultrasound on 08/06/21 during the morning. Pt is worried of what they might find due to her husbands diagnosis during his kidney ultrasound. Pt states that she is taking her anxiety medication. Kristen Powell states that she has been having a very exciting weekend and has been busy dealing with extended family and is "coming down from the busy" today.  First blood pressure check read 170/78. Pt states that that is similar to her reading this morning which read 168/72. Pt waited 10 minutes and second BP reading was 172/72. Pt brought in a blood pressure log for the month of August.   Pt was taken to lab for a MetB per instructions from Dr. Nicki Reaper. [place], CMA

## 2021-08-06 ENCOUNTER — Ambulatory Visit
Admission: RE | Admit: 2021-08-06 | Discharge: 2021-08-06 | Disposition: A | Discharge status: Home / Self Care | Payer: Medicare Other | Source: Ambulatory Visit | Attending: Internal Medicine | Admitting: Internal Medicine

## 2021-08-06 DIAGNOSIS — I150 Renovascular hypertension: Secondary | ICD-10-CM | POA: Diagnosis not present

## 2021-08-06 DIAGNOSIS — I10 Essential (primary) hypertension: Secondary | ICD-10-CM | POA: Diagnosis not present

## 2021-08-06 DIAGNOSIS — E871 Hypo-osmolality and hyponatremia: Secondary | ICD-10-CM | POA: Diagnosis not present

## 2021-08-06 DIAGNOSIS — I701 Atherosclerosis of renal artery: Secondary | ICD-10-CM | POA: Diagnosis not present

## 2021-08-06 DIAGNOSIS — I1A0 Resistant hypertension: Secondary | ICD-10-CM

## 2021-08-06 DIAGNOSIS — I159 Secondary hypertension, unspecified: Secondary | ICD-10-CM | POA: Diagnosis not present

## 2021-08-11 ENCOUNTER — Encounter: Payer: Medicare Other | Admitting: Internal Medicine

## 2021-08-12 MED ORDER — HYDRALAZINE HCL 10 MG PO TABS: 10.0000 mg | ORAL_TABLET | Freq: Three times a day (TID) | ORAL | 1 refills | Status: DC

## 2021-08-12 NOTE — Telephone Encounter (Signed)
I have sent in the prescription for hydralazine.  I would like for her to take it three times per day.  Continue to monitor blood pressures.  Needs appt as outlined previously.

## 2021-08-12 NOTE — Telephone Encounter (Signed)
Einar Pheasant, MD  08/06/2021 12:39 AM EDT     Notify [name] that her sodium level is relatively stable.  Still slightly decreased, but stable.  Kidney function relatively stable.  Ultrasound scheduled for 08/06/21.  Hydralazine added.  Monitor blood pressures.  Keep scheduled f/u appt.   Patient states that she has not received the hydralazine and was wondering if she needs to take this 3 times a day?   Please advise   States her Bp today was 147/61.

## 2021-08-12 NOTE — Telephone Encounter (Signed)
Left a message to call back.

## 2021-08-12 NOTE — Telephone Encounter (Signed)
Patient returned office phone call. Please call patient back on home phone, [phone].

## 2021-08-13 ENCOUNTER — Telehealth: Payer: Self-pay | Admitting: Adult Health

## 2021-08-13 ENCOUNTER — Other Ambulatory Visit: Payer: Self-pay

## 2021-08-13 DIAGNOSIS — I10 Essential (primary) hypertension: Secondary | ICD-10-CM

## 2021-08-13 NOTE — Telephone Encounter (Signed)
Converted to refill request

## 2021-08-13 NOTE — Telephone Encounter (Signed)
[facility] faxed refill request for the following medications:     Please advise  amLODipine (NORVASC) 5 MG tablet

## 2021-08-14 NOTE — Telephone Encounter (Signed)
Left a message to call back.

## 2021-08-18 ENCOUNTER — Other Ambulatory Visit: Payer: Self-pay | Admitting: Internal Medicine

## 2021-08-18 DIAGNOSIS — K219 Gastro-esophageal reflux disease without esophagitis: Secondary | ICD-10-CM

## 2021-08-19 NOTE — Telephone Encounter (Signed)
Called to speak with [place]. She verbalized understanding and had no further questions.

## 2021-08-19 NOTE — Telephone Encounter (Signed)
Pt goes to [facility]

## 2021-08-28 ENCOUNTER — Telehealth: Payer: Self-pay

## 2021-08-28 NOTE — Telephone Encounter (Signed)
Patient was instructed by access nurse to call and schedule an appointment and was given low bp information.

## 2021-08-29 ENCOUNTER — Other Ambulatory Visit: Payer: Self-pay

## 2021-08-29 ENCOUNTER — Ambulatory Visit (INDEPENDENT_AMBULATORY_CARE_PROVIDER_SITE_OTHER): Payer: Medicare Other | Admitting: Family

## 2021-08-29 ENCOUNTER — Encounter: Payer: Self-pay | Admitting: Family

## 2021-08-29 VITALS — BP 142/64 | HR 63 | Temp 98.3°F | Ht 61.0 in | Wt 170.4 lb

## 2021-08-29 DIAGNOSIS — E871 Hypo-osmolality and hyponatremia: Secondary | ICD-10-CM | POA: Diagnosis not present

## 2021-08-29 DIAGNOSIS — Z23 Encounter for immunization: Secondary | ICD-10-CM

## 2021-08-29 DIAGNOSIS — I10 Essential (primary) hypertension: Secondary | ICD-10-CM

## 2021-08-29 DIAGNOSIS — I701 Atherosclerosis of renal artery: Secondary | ICD-10-CM

## 2021-08-29 LAB — BASIC METABOLIC PANEL
BUN: 18 mg/dL (ref 6–23)
CO2: 25 mEq/L (ref 19–32)
Calcium: 9.4 mg/dL (ref 8.4–10.5)
Chloride: 97 mEq/L (ref 96–112)
Creatinine, Ser: 0.93 mg/dL (ref 0.40–1.20)
GFR: 60.08 mL/min (ref >60.00)
Glucose, Bld: 82 mg/dL (ref 70–99)
Potassium: 4.7 mEq/L (ref 3.5–5.1)
Sodium: 131 mEq/L — ABNORMAL LOW (ref 135–145)

## 2021-08-29 MED ORDER — CARVEDILOL 3.125 MG PO TABS: 3.1250 mg | ORAL_TABLET | Freq: Two times a day (BID) | ORAL | 3 refills | Status: DC

## 2021-08-29 NOTE — Assessment & Plan Note (Addendum)
Systolic blood pressure remains elevated.  Advised to stop this by systolic and start carvedilol 3.125 mg twice a day.  She will remain on amlodipine 5 mg twice daily, losartan 100 mg every morning, hydralazine 10 mg 3 times daily.  She will call with blood pressure, heart rate readings from home.  Close follow-up in 1 month's time.  Cin ounseled on diet low sodium however this is difficult as she has a history of hyponatremia.  Referral to endocrinology for further evaluation hyponatremia.

## 2021-08-29 NOTE — Progress Notes (Signed)
Subjective:    Patient ID: Kristen Powell, female    DOB: 1946-02-14, 75 y.o.   MRN: [id number]  CC: Kristen Powell is a 75 y.o. female who presents today for follow up.   HPI: Feels well today No new complaints.   No cp, sob, HA, vision changes .  Yesterday 128/ 64. Average more 140-150/ 51-60.   HTN- amlodipine 5 MG tablet bid, bystolic 10 mg qhs, losartan 100 in am . She started hydralazine '10mg'$  TID 12 days ago.    Dr Nicki Reaper started hydralazine '10mg'$  TID 08/05/21.Pending response, may consider changing bystolic to carvedilol.   She has been on atenolol in the past.   She never had feeling refreshed most with sleep study. No fatigue , ha. Sleep is restorative. Not sure if snores as lives alone.   She salts her eggs, potatoes, using Light Morton Salt with less sodium.   No NSAIDs.  History of hyponatremia Seen 07/04/2021 by Dr Aundra Dubin  07/12/21 Dr Derrel Nip increased bystolic to '10mg'$      08/06/21 renal US - Normal sonographic appearance of the kidneys. Normal arterial duplex exam of the bilateral renal arteries, no findings to suggest renal artery stenosis. HISTORY:  Past Medical History:  Diagnosis Date   Anxiety    COVID-19    Hyperlipidemia    Hypertension    Psoriasis    younger age   Spinal stenosis    Traumatic rupture of left ear drum    Past Surgical History:  Procedure Laterality Date   ABDOMINAL HYSTERECTOMY  12/14/1978   Partial   BASAL CELL CARCINOMA EXCISION  2006,2008,2011   BUNIONECTOMY     BUNIONECTOMY Bilateral 12/14/1990   KNEE ARTHROSCOPY     KNEE CLOSED REDUCTION  05/07/2015   Procedure: CLOSED MANIPULATION KNEE;  Surgeon: Hessie Knows, MD;  Location: [facility] ORS;  Service: Orthopedics;;   KNEE SURGERY Bilateral 2005,2006   Repair   MOHS SURGERY     nose bcc, left cheeck Dr. Delice Lesch f/u Q 12/2020   REPLACEMENT TOTAL KNEE Left 04/11/2015   manipulation--05/07/2015   ROTATOR CUFF REPAIR W/ DISTAL CLAVICLE EXCISION     SHOULDER SURGERY Right     x2   SKIN CANCER EXCISION     TOTAL KNEE ARTHROPLASTY     VAGINAL HYSTERECTOMY     Family History  Problem Relation Age of Onset   Cancer Mother        lung   Hypertension Sister    Breast cancer Paternal Aunt    Heart attack Sister     Allergies: Adhesive [tape] Current Outpatient Medications on File Prior to Visit  Medication Sig Dispense Refill   acetaminophen (TYLENOL) 500 MG tablet Take 1-2 tablets (500-1,000 mg total) by mouth every 4 (four) hours as needed for fever. 30 tablet 0   amLODipine (NORVASC) 5 MG tablet Take 1 tablet (5 mg total) by mouth in the morning and at bedtime. BP >140/>90 180 tablet 3   aspirin 81 MG tablet Take 81 mg by mouth daily.     calcium carbonate (OS-CAL) 600 MG TABS tablet Take 600 mg by mouth daily.     Cholecalciferol (VITAMIN D) 2000 UNITS CAPS Take 1 capsule by mouth daily.     citalopram (CELEXA) 10 MG tablet Take 1 tablet (10 mg total) by mouth daily. D/c effexor 37.5 90 tablet 3   clobetasol cream (TEMOVATE) [id number] % Apply 1 application topically 2 (two) times daily. prn 45 g 0   gabapentin (NEURONTIN) 100  MG capsule Take 1 capsule (100 mg total) by mouth at bedtime. 90 capsule 3   hydrALAZINE (APRESOLINE) 10 MG tablet Take 1 tablet (10 mg total) by mouth 3 (three) times daily. 90 tablet 1   LORazepam (ATIVAN) 0.5 MG tablet Take 0.5-1 tablets (0.25-0.5 mg total) by mouth daily as needed for anxiety. 30 tablet 2   losartan (COZAAR) 100 MG tablet Take 1 tablet (100 mg total) by mouth daily. D/c 50 mg 90 tablet 3   Omega-3 Fatty Acids (FISH OIL) 1200 MG CAPS Take 1 capsule by mouth 2 (two) times daily.     omeprazole (PRILOSEC) 20 MG capsule TAKE 1 CAPSULE BY MOUTH TWICE A DAY 180 capsule 0   omeprazole (PRILOSEC) 40 MG capsule Take 1 capsule (40 mg total) by mouth daily. 30 min before meal 90 capsule 3   simvastatin (ZOCOR) 20 MG tablet TAKE 1 TABLET BY MOUTH EVERY DAY AT 6PM 90 tablet 2   vitamin B-12 (CYANOCOBALAMIN) 1000 MCG tablet Take  1,000 mcg by mouth daily.     Current Facility-Administered Medications on File Prior to Visit  Medication Dose Route Frequency Provider Last Rate Last Admin   betamethasone acetate-betamethasone sodium phosphate (CELESTONE) injection 3 mg  3 mg Intra-articular Once Edrick Kins, DPM        Social History   Tobacco Use   Smoking status: Never   Smokeless tobacco: Never  Substance Use Topics   Alcohol use: No   Drug use: No    Review of Systems  Constitutional:  Negative for chills and fever.  Respiratory:  Negative for cough.   Cardiovascular:  Negative for chest pain and palpitations.  Gastrointestinal:  Negative for nausea and vomiting.     Objective:    BP (!) 142/64 (BP Location: Left Arm, Patient Position: Sitting, Cuff Size: Large)   Pulse 63   Temp 98.3 F (36.8 C) (Oral)   Ht '5\' 1"'$  (1.549 m)   Wt 170 lb 6.4 oz (77.3 kg)   SpO2 99%   BMI 32.20 kg/m  BP Readings from Last 3 Encounters:  08/29/21 (!) 142/64  08/05/21 (!) 172/72  07/09/21 (!) 144/76   Wt Readings from Last 3 Encounters:  08/29/21 170 lb 6.4 oz (77.3 kg)  08/05/21 171 lb 3.2 oz (77.7 kg)  07/09/21 169 lb 3.2 oz (76.7 kg)    Physical Exam Vitals reviewed.  Constitutional:      Appearance: She is well-developed.  Eyes:     Conjunctiva/sclera: Conjunctivae normal.  Cardiovascular:     Rate and Rhythm: Normal rate and regular rhythm.     Pulses: Normal pulses.     Heart sounds: Normal heart sounds.  Pulmonary:     Effort: Pulmonary effort is normal.     Breath sounds: Normal breath sounds. No wheezing, rhonchi or rales.  Skin:    General: Skin is warm and dry.  Neurological:     Mental Status: She is alert.  Psychiatric:        Speech: Speech normal.        Behavior: Behavior normal.        Thought Content: Thought content normal.       Assessment & Plan:   Problem List Items Addressed This Visit       Cardiovascular and Mediastinum   Essential hypertension - Primary     Systolic blood pressure remains elevated.  Advised to stop this by systolic and start carvedilol 3.125 mg twice a day.  She will  remain on amlodipine 5 mg twice daily, losartan 100 mg every morning, hydralazine 10 mg 3 times daily.  She will call with blood pressure, heart rate readings from home.  Close follow-up in 1 month's time.  Cin ounseled on diet low sodium however this is difficult as she has a history of hyponatremia.  Referral to endocrinology for further evaluation hyponatremia.      Relevant Medications   carvedilol (COREG) 3.125 MG tablet   Other Relevant Orders   Basic metabolic panel   Other Visit Diagnoses     Need for immunization against influenza       Relevant Orders   Flu Vaccine QUAD High Dose(Fluad) (Completed)   Hyponatremia       Relevant Orders   Ambulatory referral to Endocrinology   Basic metabolic panel        I have discontinued Raffaela A. Buchanan's nebivolol. I am also having her start on carvedilol. Additionally, I am having her maintain her aspirin, Vitamin D, calcium carbonate, vitamin B-12, Fish Oil, acetaminophen, simvastatin, losartan, omeprazole, citalopram, LORazepam, gabapentin, amLODipine, clobetasol cream, hydrALAZINE, and omeprazole. We will continue to administer betamethasone acetate-betamethasone sodium phosphate.   Meds ordered this encounter  Medications   carvedilol (COREG) 3.125 MG tablet    Sig: Take 1 tablet (3.125 mg total) by mouth 2 (two) times daily with a meal.    Dispense:  120 tablet    Refill:  3    Order Specific Question:   Supervising Provider    Answer:   Crecencio Mc [[id number]]    Return precautions given.   Risks, benefits, and alternatives of the medications and treatment plan prescribed today were discussed, and patient expressed understanding.   Education regarding symptom management and diagnosis given to patient on AVS.  Continue to follow with Flinchum, Kelby Aline, FNP for routine health maintenance.    Kristen Powell and I agreed with plan.   Mable Paris, FNP

## 2021-08-29 NOTE — Patient Instructions (Addendum)
STOP Bystolic  Start Carvedilol 3.'125mg'$  twice per day with meals.   Referral to endocrine.  Let us know if you dont hear back within a week in regards to an appointment being scheduled.    Managing Your Hypertension Hypertension, also called high blood pressure, is when the force of the blood pressing against the walls of the arteries is too strong. Arteries are blood vessels that carry blood from your heart throughout your body. Hypertension forces the heart to work harder to pump blood and may cause the arteries to become narrow or stiff. Understanding blood pressure readings Your personal target blood pressure may vary depending on your medical conditions, your age, and other factors. A blood pressure reading includes a higher number over a lower number. Ideally, your blood pressure should be below 120/80. You should know that: The first, or top, number is called the systolic pressure. It is a measure of the pressure in your arteries as your heart beats. The second, or bottom number, is called the diastolic pressure. It is a measure of the pressure in your arteries as the heart relaxes. Blood pressure is classified into four stages. Based on your blood pressure reading, your health care provider may use the following stages to determine what type of treatment you need, if any. Systolic pressure and diastolic pressure are measured in a unit called mmHg. Normal Systolic pressure: below 123456. Diastolic pressure: below 80. Elevated Systolic pressure: [id number]. Diastolic pressure: below 80. Hypertension stage 1 Systolic pressure: 0000000. Diastolic pressure: [id number]. Hypertension stage 2 Systolic pressure: [id number] or above. Diastolic pressure: 90 or above. How can this condition affect me? Managing your hypertension is an important responsibility. Over time, hypertension can damage the arteries and decrease blood flow to important parts of the body, including the brain, heart, and kidneys. Having  untreated or uncontrolled hypertension can lead to: A heart attack. A stroke. A weakened blood vessel (aneurysm). Heart failure. Kidney damage. Eye damage. Metabolic syndrome. Memory and concentration problems. Vascular dementia. What actions can I take to manage this condition? Hypertension can be managed by making lifestyle changes and possibly by taking medicines. Your health care provider will help you make a plan to bring your blood pressure within a normal range. Nutrition  Eat a diet that is high in fiber and potassium, and low in salt (sodium), added sugar, and fat. An example eating plan is called the Dietary Approaches to Stop Hypertension (DASH) diet. To eat this way: Eat plenty of fresh fruits and vegetables. Try to fill one-half of your plate at each meal with fruits and vegetables. Eat whole grains, such as whole-wheat pasta, brown rice, or whole-grain bread. Fill about one-fourth of your plate with whole grains. Eat low-fat dairy products. Avoid fatty cuts of meat, processed or cured meats, and poultry with skin. Fill about one-fourth of your plate with lean proteins such as fish, chicken without skin, beans, eggs, and tofu. Avoid pre-made and processed foods. These tend to be higher in sodium, added sugar, and fat. Reduce your daily sodium intake. Most people with hypertension should eat less than 1,500 mg of sodium a day. Lifestyle  Work with your health care provider to maintain a healthy body weight or to lose weight. Ask what an ideal weight is for you. Get at least 30 minutes of exercise that causes your heart to beat faster (aerobic exercise) most days of the week. Activities may include walking, swimming, or biking. Include exercise to strengthen your muscles (resistance exercise), such as weight  lifting, as part of your weekly exercise routine. Try to do these types of exercises for 30 minutes at least 3 days a week. Do not use any products that contain nicotine or  tobacco, such as cigarettes, e-cigarettes, and chewing tobacco. If you need help quitting, ask your health care provider. Control any long-term (chronic) conditions you have, such as high cholesterol or diabetes. Identify your sources of stress and find ways to manage stress. This may include meditation, deep breathing, or making time for fun activities. Alcohol use Do not drink alcohol if: Your health care provider tells you not to drink. You are pregnant, may be pregnant, or are planning to become pregnant. If you drink alcohol: Limit how much you use to: 0-1 drink a day for women. 0-2 drinks a day for men. Be aware of how much alcohol is in your drink. In the [place] one drink equals one 12 oz bottle of beer (355 mL), one 5 oz glass of wine (148 mL), or one 1 oz glass of hard liquor (44 mL). Medicines Your health care provider may prescribe medicine if lifestyle changes are not enough to get your blood pressure under control and if: Your systolic blood pressure is 130 or higher. Your diastolic blood pressure is 80 or higher. Take medicines only as told by your health care provider. Follow the directions carefully. Blood pressure medicines must be taken as told by your health care provider. The medicine does not work as well when you skip doses. Skipping doses also puts you at risk for problems. Monitoring Before you monitor your blood pressure: Do not smoke, drink caffeinated beverages, or exercise within 30 minutes before taking a measurement. Use the bathroom and empty your bladder (urinate). Sit quietly for at least 5 minutes before taking measurements. Monitor your blood pressure at home as told by your health care provider. To do this: Sit with your back straight and supported. Place your feet flat on the floor. Do not cross your legs. Support your arm on a flat surface, such as a table. Make sure your upper arm is at heart level. Each time you measure, take two or three readings one  minute apart and record the results. You may also need to have your blood pressure checked regularly by your health care provider. General information Talk with your health care provider about your diet, exercise habits, and other lifestyle factors that may be contributing to hypertension. Review all the medicines you take with your health care provider because there may be side effects or interactions. Keep all visits as told by your health care provider. Your health care provider can help you create and adjust your plan for managing your high blood pressure. Where to find more information National Heart, Lung, and Blood Institute: https://wilson-eaton.com/ American Heart Association: www.heart.org Contact a health care provider if: You think you are having a reaction to medicines you have taken. You have repeated (recurrent) headaches. You feel dizzy. You have swelling in your ankles. You have trouble with your vision. Get help right away if: You develop a severe headache or confusion. You have unusual weakness or numbness, or you feel faint. You have severe pain in your chest or abdomen. You vomit repeatedly. You have trouble breathing. These symptoms may represent a serious problem that is an emergency. Do not wait to see if the symptoms will go away. Get medical help right away. Call your local emergency services (911 in the [place] Do not drive yourself to the hospital. Summary  Hypertension is when the force of blood pumping through your arteries is too strong. If this condition is not controlled, it may put you at risk for serious complications. Your personal target blood pressure may vary depending on your medical conditions, your age, and other factors. For most people, a normal blood pressure is less than 120/80. Hypertension is managed by lifestyle changes, medicines, or both. Lifestyle changes to help manage hypertension include losing weight, eating a healthy, low-sodium diet,  exercising more, stopping smoking, and limiting alcohol. This information is not intended to replace advice given to you by your health care provider. Make sure you discuss any questions you have with your health care provider. Document Revised: 01/05/2020 Document Reviewed: 10/31/2019 Elsevier Patient Education  2022 [employer].

## 2021-09-04 ENCOUNTER — Telehealth: Payer: Self-pay | Admitting: Adult Health

## 2021-09-04 NOTE — Telephone Encounter (Signed)
Patient is calling in to schedule her endocrinology appointment.Please call her at [phone].

## 2021-09-09 ENCOUNTER — Ambulatory Visit: Payer: Medicare Other | Admitting: Adult Health

## 2021-09-29 ENCOUNTER — Ambulatory Visit: Payer: Medicare Other | Admitting: Adult Health

## 2021-10-03 ENCOUNTER — Ambulatory Visit: Payer: Medicare Other | Admitting: Family

## 2021-10-04 ENCOUNTER — Other Ambulatory Visit: Payer: Self-pay | Admitting: Internal Medicine

## 2021-11-05 ENCOUNTER — Ambulatory Visit: Payer: Medicare Other | Admitting: Family

## 2021-11-05 ENCOUNTER — Encounter: Payer: Self-pay | Admitting: Family

## 2021-11-05 ENCOUNTER — Ambulatory Visit (INDEPENDENT_AMBULATORY_CARE_PROVIDER_SITE_OTHER): Payer: Medicare Other | Admitting: Family

## 2021-11-05 ENCOUNTER — Other Ambulatory Visit: Payer: Self-pay

## 2021-11-05 VITALS — BP 150/80 | HR 75 | Temp 97.6°F | Wt 170.4 lb

## 2021-11-05 DIAGNOSIS — Z1211 Encounter for screening for malignant neoplasm of colon: Secondary | ICD-10-CM

## 2021-11-05 DIAGNOSIS — I701 Atherosclerosis of renal artery: Secondary | ICD-10-CM

## 2021-11-05 DIAGNOSIS — I10 Essential (primary) hypertension: Secondary | ICD-10-CM

## 2021-11-05 NOTE — Progress Notes (Signed)
Subjective:    Patient ID: Kristen Powell, female    DOB: [birth year], 75 y.o.   MRN: [id number]  CC: Kristen Powell is a 75 y.o. female who presents today for follow up.   HPI: Feels well today.  No complaints. She is active at home with housework and yard work.  She also rides a stationary exercise bike.  Denies any chest pain, shortness of breath. She feels her blood pressure elevates when in the doctors office.  She has noticed improvement since starting the carvedilol and her blood pressure readings.  Follow-up hypertension-at home 136/59. She has been getting 141/61, 131/60, 129/60.  She reports her average to be 135/57. She is limiting salt.  She has had BP cuff checked in our office before for accuracy.   She hasnt taken carvedilol this morning. She has taken hydralazine, losartan, and amlodipine this morning.   compliant with carvedilol 3.125mg  BID, amlodipine 5 mg twice daily, losartan 100 mg every morning, hydralazine 10 mg 3 times daily.   Pending referral with endocrine. Na 131 two months ago  Due colonoscopy; she has never had one  HISTORY:  Past Medical History:  Diagnosis Date   Anxiety    COVID-19    Hyperlipidemia    Hypertension    Psoriasis    younger age   Spinal stenosis    Traumatic rupture of left ear drum    Past Surgical History:  Procedure Laterality Date   ABDOMINAL HYSTERECTOMY  12/14/1978   Partial   BASAL CELL CARCINOMA EXCISION  2006,2008,2011   BUNIONECTOMY     BUNIONECTOMY Bilateral 12/14/1990   KNEE ARTHROSCOPY     KNEE CLOSED REDUCTION  05/07/2015   Procedure: CLOSED MANIPULATION KNEE;  Surgeon: Hessie Knows, MD;  Location: [facility] ORS;  Service: Orthopedics;;   KNEE SURGERY Bilateral 2005,2006   Repair   MOHS SURGERY     nose bcc, left cheeck Dr. Delice Lesch f/u Q 12/2020   REPLACEMENT TOTAL KNEE Left 04/11/2015   manipulation--05/07/2015   ROTATOR CUFF REPAIR W/ DISTAL CLAVICLE EXCISION     SHOULDER SURGERY Right    x2   SKIN  CANCER EXCISION     TOTAL KNEE ARTHROPLASTY     VAGINAL HYSTERECTOMY     Family History  Problem Relation Age of Onset   Cancer Mother        lung   Hypertension Sister    Heart attack Sister 73   Breast cancer Paternal Aunt     Allergies: Adhesive [tape] Current Outpatient Medications on File Prior to Visit  Medication Sig Dispense Refill   acetaminophen (TYLENOL) 500 MG tablet Take 1-2 tablets (500-1,000 mg total) by mouth every 4 (four) hours as needed for fever. 30 tablet 0   amLODipine (NORVASC) 5 MG tablet Take 1 tablet (5 mg total) by mouth in the morning and at bedtime. BP >140/>90 180 tablet 3   aspirin 81 MG tablet Take 81 mg by mouth daily.     calcium carbonate (OS-CAL) 600 MG TABS tablet Take 600 mg by mouth daily.     carvedilol (COREG) 3.125 MG tablet Take 1 tablet (3.125 mg total) by mouth 2 (two) times daily with a meal. 120 tablet 3   Cholecalciferol (VITAMIN D) 2000 UNITS CAPS Take 1 capsule by mouth daily.     citalopram (CELEXA) 10 MG tablet Take 1 tablet (10 mg total) by mouth daily. D/c effexor 37.5 90 tablet 3   clobetasol cream (TEMOVATE) 3.78 % Apply 1 application  topically 2 (two) times daily. prn 45 g 0   gabapentin (NEURONTIN) 100 MG capsule Take 1 capsule (100 mg total) by mouth at bedtime. 90 capsule 3   hydrALAZINE (APRESOLINE) 10 MG tablet TAKE 1 TABLET BY MOUTH THREE TIMES A DAY 90 tablet 1   LORazepam (ATIVAN) 0.5 MG tablet Take 0.5-1 tablets (0.25-0.5 mg total) by mouth daily as needed for anxiety. 30 tablet 2   losartan (COZAAR) 100 MG tablet Take 1 tablet (100 mg total) by mouth daily. D/c 50 mg 90 tablet 3   Omega-3 Fatty Acids (FISH OIL) 1200 MG CAPS Take 1 capsule by mouth 2 (two) times daily.     omeprazole (PRILOSEC) 20 MG capsule TAKE 1 CAPSULE BY MOUTH TWICE A DAY 180 capsule 0   simvastatin (ZOCOR) 20 MG tablet TAKE 1 TABLET BY MOUTH EVERY DAY AT 6PM 90 tablet 2   vitamin B-12 (CYANOCOBALAMIN) 1000 MCG tablet Take 1,000 mcg by mouth daily.      Current Facility-Administered Medications on File Prior to Visit  Medication Dose Route Frequency Provider Last Rate Last Admin   betamethasone acetate-betamethasone sodium phosphate (CELESTONE) injection 3 mg  3 mg Intra-articular Once Edrick Kins, DPM        Social History   Tobacco Use   Smoking status: Never   Smokeless tobacco: Never  Substance Use Topics   Alcohol use: No   Drug use: No    Review of Systems  Constitutional:  Negative for chills and fever.  Respiratory:  Negative for cough.   Cardiovascular:  Negative for chest pain and palpitations.  Gastrointestinal:  Negative for nausea and vomiting.     Objective:    BP (!) 150/80 (BP Location: Left Arm, Patient Position: Sitting, Cuff Size: Normal)   Pulse 75   Temp 97.6 F (36.4 C) (Temporal)   Wt 170 lb 6.4 oz (77.3 kg)   SpO2 98%   BMI 32.20 kg/m  BP Readings from Last 3 Encounters:  11/05/21 (!) 150/80  08/29/21 (!) 142/64  08/05/21 (!) 172/72   Wt Readings from Last 3 Encounters:  11/05/21 170 lb 6.4 oz (77.3 kg)  08/29/21 170 lb 6.4 oz (77.3 kg)  08/05/21 171 lb 3.2 oz (77.7 kg)    Physical Exam Vitals reviewed.  Constitutional:      Appearance: She is well-developed.  Eyes:     Conjunctiva/sclera: Conjunctivae normal.  Cardiovascular:     Rate and Rhythm: Normal rate and regular rhythm.     Pulses: Normal pulses.     Heart sounds: Normal heart sounds.  Pulmonary:     Effort: Pulmonary effort is normal.     Breath sounds: Normal breath sounds. No wheezing, rhonchi or rales.  Skin:    General: Skin is warm and dry.  Neurological:     Mental Status: She is alert.  Psychiatric:        Speech: Speech normal.        Behavior: Behavior normal.        Thought Content: Thought content normal.       Assessment & Plan:   Problem List Items Addressed This Visit       Cardiovascular and Mediastinum   Essential hypertension    Remains elevated in the office.  Controlled at home and  reports improvement with change from bystolic to carvedilol. She hasnt taken carvedilol this morning. Escalation of anti hypertensives is limited by low DBP.  Continue carvedilol 3.125mg  BID, amlodipine 5 mg twice daily, losartan  100 mg every morning, hydralazine 10 mg 3 times daily. We did discuss referral to cardiology for restratification based on her sister's history and her history of hypertension.  She politely declines at this time in the absence of any symptoms.  Will follow closely.      Other Visit Diagnoses     Screen for colon cancer    -  Primary   Relevant Orders   Ambulatory referral to Gastroenterology        I am having Kristen Powell maintain her aspirin, Vitamin D, calcium carbonate, vitamin B-12, Fish Oil, acetaminophen, simvastatin, losartan, citalopram, LORazepam, gabapentin, amLODipine, clobetasol cream, omeprazole, carvedilol, and hydrALAZINE. We will continue to administer betamethasone acetate-betamethasone sodium phosphate.   No orders of the defined types were placed in this encounter.   Return precautions given.   Risks, benefits, and alternatives of the medications and treatment plan prescribed today were discussed, and patient expressed understanding.   Education regarding symptom management and diagnosis given to patient on AVS.  Continue to follow with Burnard Hawthorne, FNP for routine health maintenance.   Kristen Powell and I agreed with plan.   Kristen Paris, FNP

## 2021-11-05 NOTE — Patient Instructions (Addendum)
Referral for colonoscopy  Let us know if you dont hear back within a week in regards to an appointment being scheduled.    it is imperative that you are seen AT least twice per year for labs and monitoring. Monitor blood pressure at home and me 5-6 reading on separate days. Goal is less than 135/80 ,  based on newest guidelines  if persistently higher, please make sooner follow up appointment so we can recheck you blood pressure and manage/ adjust medications.  Nice to see you

## 2021-11-05 NOTE — Assessment & Plan Note (Addendum)
Remains elevated in the office.  Controlled at home and reports improvement with change from bystolic to carvedilol. She hasnt taken carvedilol this morning. Escalation of anti hypertensives is limited by low DBP.  Continue carvedilol 3.125mg  BID, amlodipine 5 mg twice daily, losartan 100 mg every morning, hydralazine 10 mg 3 times daily. We did discuss referral to cardiology for restratification based on her sister's history and her history of hypertension.  She politely declines at this time in the absence of any symptoms.  Will follow closely.

## 2021-11-12 ENCOUNTER — Other Ambulatory Visit: Payer: Self-pay | Admitting: Adult Health

## 2021-11-12 DIAGNOSIS — K219 Gastro-esophageal reflux disease without esophagitis: Secondary | ICD-10-CM

## 2021-11-17 ENCOUNTER — Telehealth: Payer: Self-pay | Admitting: Family

## 2021-11-17 ENCOUNTER — Other Ambulatory Visit: Payer: Self-pay

## 2021-11-17 DIAGNOSIS — I10 Essential (primary) hypertension: Secondary | ICD-10-CM

## 2021-11-17 DIAGNOSIS — Z1211 Encounter for screening for malignant neoplasm of colon: Secondary | ICD-10-CM

## 2021-11-17 MED ORDER — PEG 3350-KCL-NA BICARB-NACL 420 G PO SOLR: 4000.0000 mL | Freq: Once | ORAL | 0 refills | Status: DC

## 2021-11-17 MED ORDER — PEG 3350-KCL-NA BICARB-NACL 420 G PO SOLR: 4000.0000 mL | Freq: Once | ORAL | 0 refills | Status: AC

## 2021-11-17 NOTE — Progress Notes (Signed)
Gastroenterology Pre-Procedure Review  Request Date: 12/09/21  Requesting Physician: Dr. Allen Norris  PATIENT REVIEW QUESTIONS: The patient responded to the following health history questions as indicated:    1. Are you having any GI issues? no 2. Do you have a personal history of Polyps? no 3. Do you have a family history of Colon Cancer or Polyps? no 4. Diabetes Mellitus? no 5. Joint replacements in the past 12 months?no 6. Major health problems in the past 3 months?no 7. Any artificial heart valves, MVP, or defibrillator?no    MEDICATIONS & ALLERGIES:    Patient reports the following regarding taking any anticoagulation/antiplatelet therapy:   Plavix, Coumadin, Eliquis, Xarelto, Lovenox, Pradaxa, Brilinta, or Effient? no Aspirin? yes (81 mg)  Patient confirms/reports the following medications:  Current Outpatient Medications  Medication Sig Dispense Refill   acetaminophen (TYLENOL) 500 MG tablet Take 1-2 tablets (500-1,000 mg total) by mouth every 4 (four) hours as needed for fever. 30 tablet 0   amLODipine (NORVASC) 5 MG tablet Take 1 tablet (5 mg total) by mouth in the morning and at bedtime. BP >140/>90 180 tablet 3   aspirin 81 MG tablet Take 81 mg by mouth daily.     calcium carbonate (OS-CAL) 600 MG TABS tablet Take 600 mg by mouth daily.     carvedilol (COREG) 3.125 MG tablet Take 1 tablet (3.125 mg total) by mouth 2 (two) times daily with a meal. 120 tablet 3   Cholecalciferol (VITAMIN D) 2000 UNITS CAPS Take 1 capsule by mouth daily.     citalopram (CELEXA) 10 MG tablet Take 1 tablet (10 mg total) by mouth daily. D/c effexor 37.5 90 tablet 3   clobetasol cream (TEMOVATE) 1.61 % Apply 1 application topically 2 (two) times daily. prn 45 g 0   gabapentin (NEURONTIN) 100 MG capsule Take 1 capsule (100 mg total) by mouth at bedtime. 90 capsule 3   hydrALAZINE (APRESOLINE) 10 MG tablet TAKE 1 TABLET BY MOUTH THREE TIMES A DAY 90 tablet 1   LORazepam (ATIVAN) 0.5 MG tablet Take 0.5-1  tablets (0.25-0.5 mg total) by mouth daily as needed for anxiety. 30 tablet 2   losartan (COZAAR) 100 MG tablet Take 1 tablet (100 mg total) by mouth daily. D/c 50 mg 90 tablet 3   Omega-3 Fatty Acids (FISH OIL) 1200 MG CAPS Take 1 capsule by mouth 2 (two) times daily.     omeprazole (PRILOSEC) 20 MG capsule TAKE 1 CAPSULE BY MOUTH TWICE A DAY 180 capsule 0   simvastatin (ZOCOR) 20 MG tablet TAKE 1 TABLET BY MOUTH EVERY DAY AT 6PM 90 tablet 2   vitamin B-12 (CYANOCOBALAMIN) 1000 MCG tablet Take 1,000 mcg by mouth daily.     Current Facility-Administered Medications  Medication Dose Route Frequency Provider Last Rate Last Admin   betamethasone acetate-betamethasone sodium phosphate (CELESTONE) injection 3 mg  3 mg Intra-articular Once Edrick Kins, DPM        Patient confirms/reports the following allergies:  Allergies  Allergen Reactions   Adhesive [Tape] Rash    redness    No orders of the defined types were placed in this encounter.   AUTHORIZATION INFORMATION Primary Insurance: 1D#: Group #:  Secondary Insurance: 1D#: Group #:  SCHEDULE INFORMATION: Date: 12/09/21 Time: Location: [facility]

## 2021-11-17 NOTE — Telephone Encounter (Signed)
Pt called in stating that NP Arnett advise Pt if she needed a referral for cardiologist to give the office a call. Pt is requesting a referral for cardiologist. Pt requesting callback.

## 2021-11-17 NOTE — Telephone Encounter (Signed)
Call pt  Agree with cardiology ref as we discussed in office  Referral placed Let us know if you dont hear back within a week in regards to an appointment being scheduled.

## 2021-11-26 ENCOUNTER — Telehealth: Payer: Self-pay | Admitting: Gastroenterology

## 2021-11-26 NOTE — Telephone Encounter (Signed)
Inbound call from pt stating that she never received her prep.

## 2021-11-27 ENCOUNTER — Other Ambulatory Visit: Payer: Self-pay

## 2021-11-27 ENCOUNTER — Other Ambulatory Visit: Payer: Self-pay | Admitting: Adult Health

## 2021-11-27 MED ORDER — PEG 3350-KCL-NA BICARB-NACL 420 G PO SOLR: 4000.0000 mL | Freq: Once | ORAL | 0 refills | Status: AC

## 2021-11-27 NOTE — Telephone Encounter (Signed)
Golytely prep has been resent to [facility] pharmacy. Pt has been informed.

## 2021-11-29 IMAGING — MG DIGITAL SCREENING BILAT W/ TOMO W/ CAD
8 series · 8 of 24 positions shown · non-contrast
Comparison: Previous exam(s).

CLINICAL DATA: Screening.

EXAM:
DIGITAL SCREENING BILATERAL MAMMOGRAM WITH TOMO AND CAD

[L MLO synth-2D]
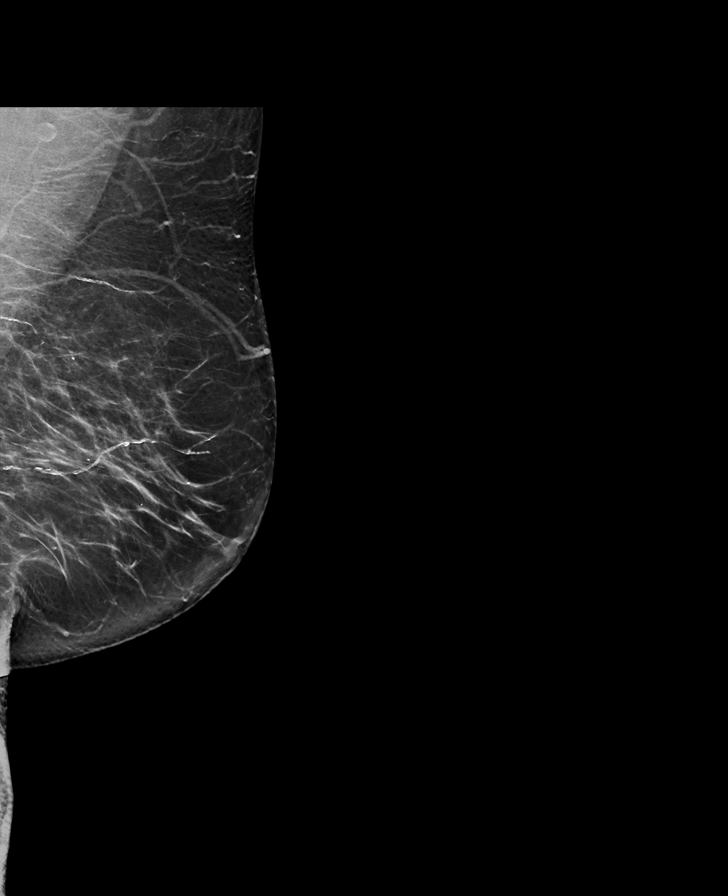

[R CC synth-2D]
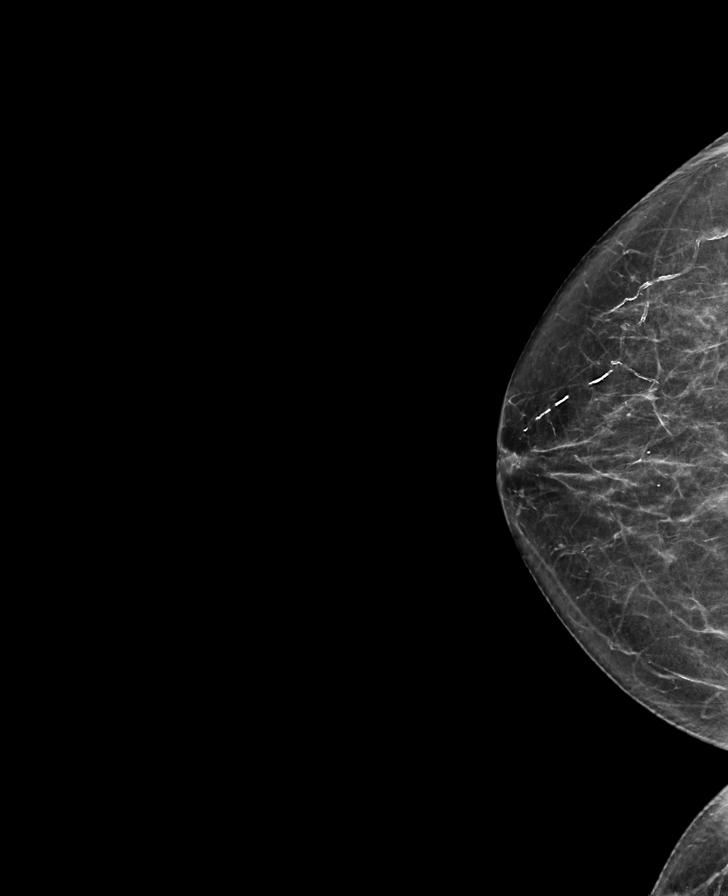

[L CC synth-2D]
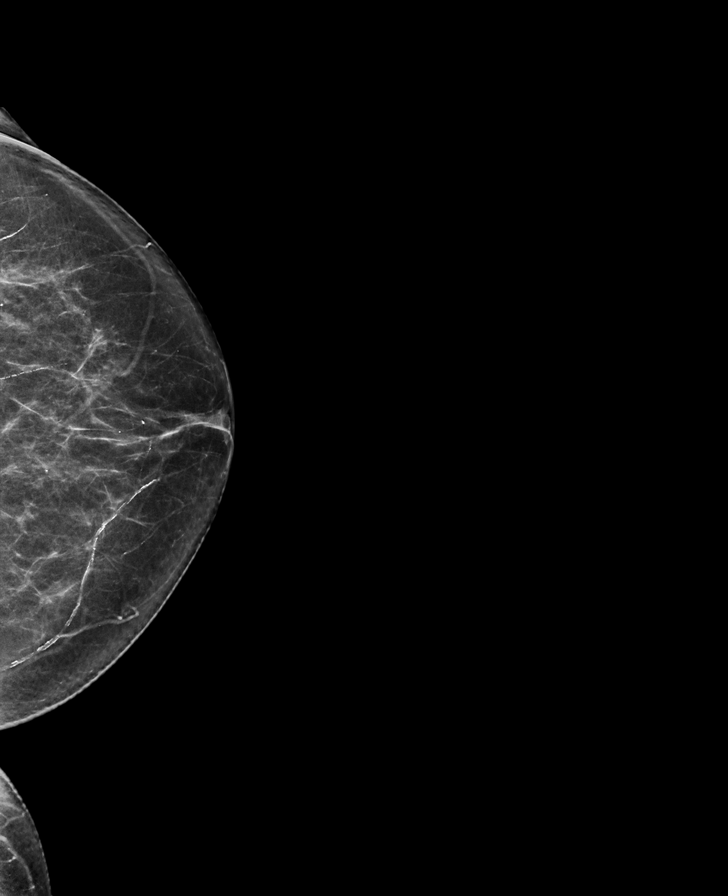

[R MLO synth-2D]
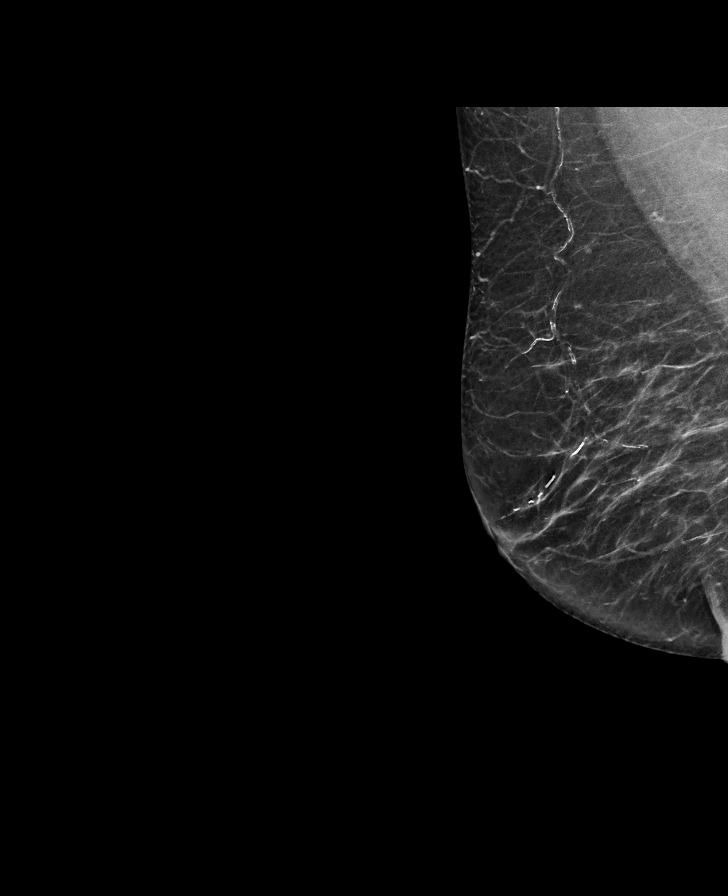

[L CC tomo · tomo slice 31/62.0]
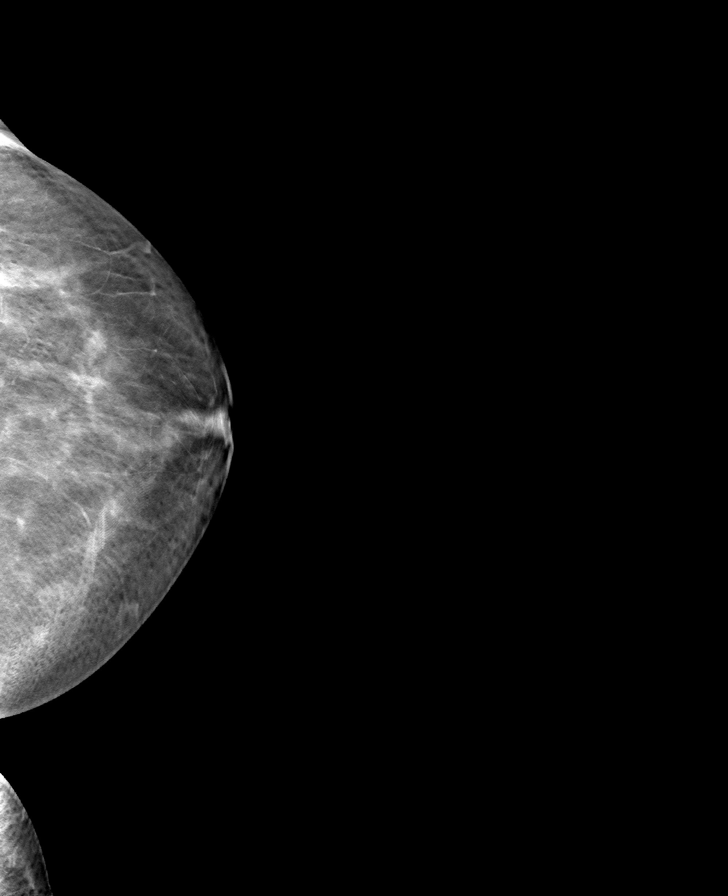

[R MLO tomo · tomo slice 35/69.0]
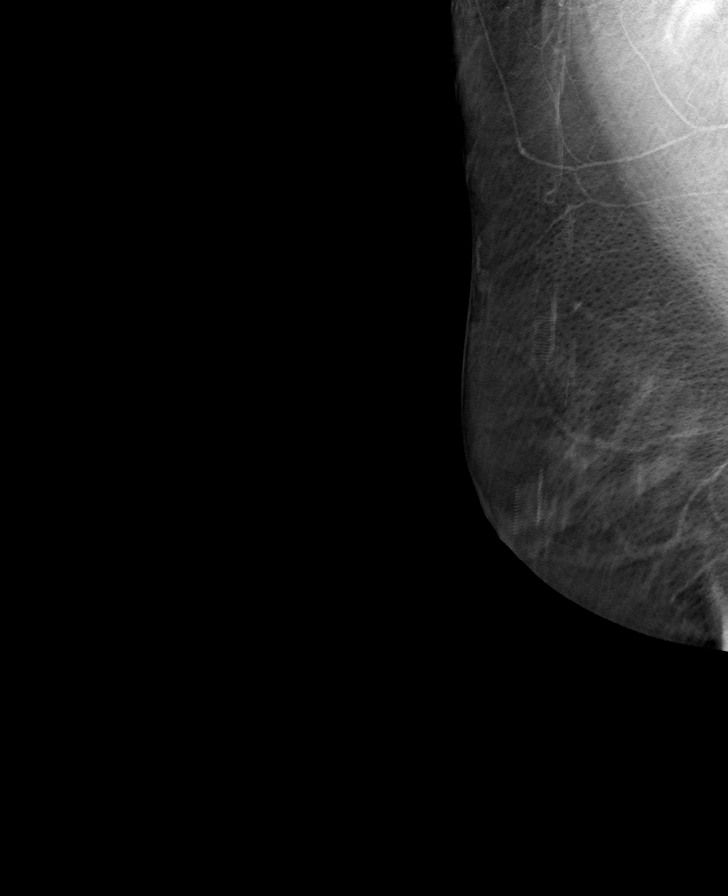

[L MLO tomo · tomo slice 38/75.0]
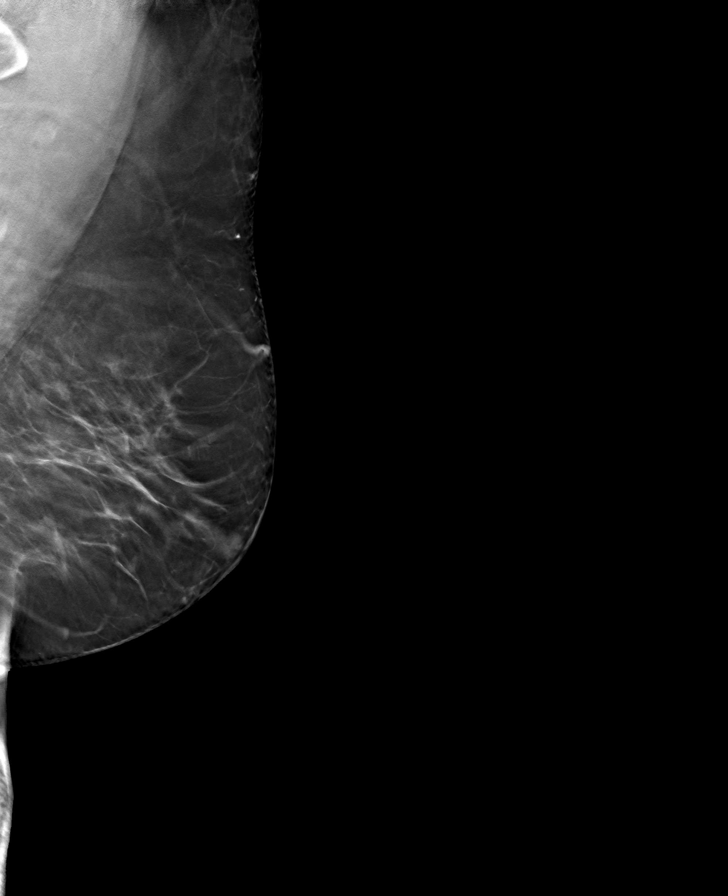

[R CC tomo · tomo slice 32/63.0]
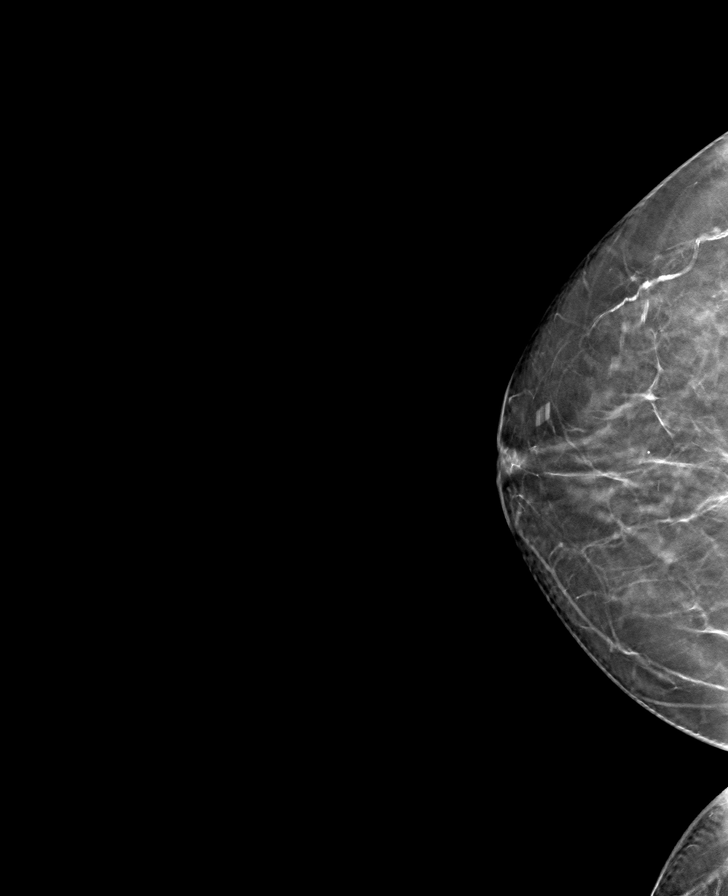

[8 of 24 positions shown; findings below may reference images not displayed]

ACR Breast Density Category b: There are scattered areas of
fibroglandular density.
FINDINGS: There are no findings suspicious for malignancy. Images were
processed with CAD.
IMPRESSION: No mammographic evidence of malignancy. A result letter of this
screening mammogram will be mailed directly to the patient.

RECOMMENDATION:
Screening mammogram in one year. (Code:[id number])

BI-RADS CATEGORY  1: Negative.

## 2021-12-01 ENCOUNTER — Telehealth: Payer: Self-pay

## 2021-12-01 NOTE — Telephone Encounter (Signed)
Returned patients call. Informed patient bowel prep has been at the pharmacy since 12/05 and resent on 12/15. Pharmacist states prep is ready. Pt verbalized understanding.

## 2021-12-09 ENCOUNTER — Encounter
Admission: RE | Disposition: A | Discharge status: Home / Self Care | Payer: Self-pay | Source: Home / Self Care | Attending: Gastroenterology

## 2021-12-09 ENCOUNTER — Ambulatory Visit: Payer: Medicare Other | Admitting: Anesthesiology

## 2021-12-09 ENCOUNTER — Ambulatory Visit
Admission: RE | Admit: 2021-12-09 | Discharge: 2021-12-09 | Disposition: A | Discharge status: Home / Self Care | Payer: Medicare Other | Attending: Gastroenterology | Admitting: Gastroenterology

## 2021-12-09 DIAGNOSIS — I10 Essential (primary) hypertension: Secondary | ICD-10-CM | POA: Diagnosis not present

## 2021-12-09 DIAGNOSIS — M1990 Unspecified osteoarthritis, unspecified site: Secondary | ICD-10-CM | POA: Insufficient documentation

## 2021-12-09 DIAGNOSIS — F419 Anxiety disorder, unspecified: Secondary | ICD-10-CM | POA: Insufficient documentation

## 2021-12-09 DIAGNOSIS — Z1211 Encounter for screening for malignant neoplasm of colon: Secondary | ICD-10-CM | POA: Diagnosis not present

## 2021-12-09 DIAGNOSIS — K5730 Diverticulosis of large intestine without perforation or abscess without bleeding: Secondary | ICD-10-CM | POA: Diagnosis not present

## 2021-12-09 DIAGNOSIS — K640 First degree hemorrhoids: Secondary | ICD-10-CM | POA: Diagnosis not present

## 2021-12-09 DIAGNOSIS — Z8616 Personal history of COVID-19: Secondary | ICD-10-CM | POA: Diagnosis not present

## 2021-12-09 DIAGNOSIS — K219 Gastro-esophageal reflux disease without esophagitis: Secondary | ICD-10-CM | POA: Diagnosis not present

## 2021-12-09 HISTORY — PX: COLONOSCOPY WITH PROPOFOL: SHX5780

## 2021-12-09 SURGERY — COLONOSCOPY WITH PROPOFOL
Anesthesia: General

## 2021-12-09 MED ORDER — PROPOFOL 500 MG/50ML IV EMUL
INTRAVENOUS | Status: DC | PRN
  Administered 2021-12-09: 100 ug/kg/min via INTRAVENOUS

## 2021-12-09 MED ORDER — FENTANYL CITRATE (PF) 100 MCG/2ML IJ SOLN
INTRAMUSCULAR | Status: DC | PRN
  Administered 2021-12-09: 50 ug via INTRAVENOUS

## 2021-12-09 MED ORDER — FENTANYL CITRATE (PF) 100 MCG/2ML IJ SOLN
INTRAMUSCULAR | Status: AC
  Filled 2021-12-09: qty 2

## 2021-12-09 MED ORDER — MIDAZOLAM HCL 2 MG/2ML IJ SOLN
INTRAMUSCULAR | Status: DC | PRN
  Administered 2021-12-09: 1 mg via INTRAVENOUS

## 2021-12-09 MED ORDER — MIDAZOLAM HCL 2 MG/2ML IJ SOLN
INTRAMUSCULAR | Status: AC
  Filled 2021-12-09: qty 2

## 2021-12-09 MED ORDER — PROPOFOL 10 MG/ML IV BOLUS
INTRAVENOUS | Status: DC | PRN
  Administered 2021-12-09: 50 mg via INTRAVENOUS

## 2021-12-09 MED ORDER — SODIUM CHLORIDE 0.9 % IV SOLN
INTRAVENOUS | Status: DC
  Administered 2021-12-09: 10:00:00 20 mL/h via INTRAVENOUS

## 2021-12-09 NOTE — Anesthesia Preprocedure Evaluation (Signed)
Anesthesia Evaluation  Patient identified by MRN, date of birth, ID band Patient awake    Reviewed: Allergy & Precautions, NPO status , Patient's Chart, lab work & pertinent test results, reviewed documented beta blocker date and time   Airway Mallampati: II  TM Distance: >3 FB Neck ROM: Full    Dental  (+) Upper Dentures, Lower Dentures   Pulmonary neg pulmonary ROS,    Pulmonary exam normal        Cardiovascular hypertension, Pt. on medications and Pt. on home beta blockers negative cardio ROS Normal cardiovascular exam     Neuro/Psych PSYCHIATRIC DISORDERS Anxiety  Neuromuscular disease    GI/Hepatic Neg liver ROS, Bowel prep,GERD  Medicated and Controlled,  Endo/Other  negative endocrine ROS  Renal/GU negative Renal ROS  negative genitourinary   Musculoskeletal  (+) Arthritis , Osteoarthritis,    Abdominal   Peds negative pediatric ROS (+)  Hematology negative hematology ROS (+)   Anesthesia Other Findings Anxiety    COVID-19    Hyperlipidemia    Hypertension    Psoriasis  younger age  Spinal stenosis    Traumatic rupture of left ear drum       Reproductive/Obstetrics negative OB ROS                             Anesthesia Physical Anesthesia Plan  ASA: 2  Anesthesia Plan: General   Post-op Pain Management:    Induction: Intravenous  PONV Risk Score and Plan: 2 and Propofol infusion and TIVA  Airway Management Planned: Natural Airway and Nasal Cannula  Additional Equipment:   Intra-op Plan:   Post-operative Plan:   Informed Consent: I have reviewed the patients History and Physical, chart, labs and discussed the procedure including the risks, benefits and alternatives for the proposed anesthesia with the patient or authorized representative who has indicated his/her understanding and acceptance.       Plan Discussed with: CRNA, Anesthesiologist and  Surgeon  Anesthesia Plan Comments:         Anesthesia Quick Evaluation

## 2021-12-09 NOTE — Transfer of Care (Signed)
Immediate Anesthesia Transfer of Care Note  Patient: Lincy Belles  Procedure(s) Performed: COLONOSCOPY WITH PROPOFOL  Patient Location: PACU  Anesthesia Type:General  Level of Consciousness: awake, alert  and oriented  Airway & Oxygen Therapy: Patient Spontanous Breathing and Patient connected to nasal cannula oxygen  Post-op Assessment: Report given to RN, Post -op Vital signs reviewed and stable and Patient moving all extremities  Post vital signs: Reviewed and stable  Last Vitals:  Vitals Value Taken Time  BP 119/54 12/09/21 1014  Temp 36.4 C 12/09/21 1011  Pulse 87 12/09/21 1014  Resp 19 12/09/21 1014  SpO2 96 % 12/09/21 1014    Last Pain:  Vitals:   12/09/21 1011  TempSrc: Temporal  PainSc: 0-No pain         Complications: No notable events documented.

## 2021-12-09 NOTE — H&P (Signed)
Kristen Lame, MD [facility] [address] [place], [place] [address] Phone: [phone] Fax : [phone]  Primary Care Physician:  Burnard Hawthorne, FNP Primary Gastroenterologist:  Dr. Allen Norris  Pre-Procedure History & Physical: HPI:  Kristen Powell is a 75 y.o. female is here for a screening colonoscopy.   Past Medical History:  Diagnosis Date   Anxiety    COVID-19    Hyperlipidemia    Hypertension    Psoriasis    younger age   Spinal stenosis    Traumatic rupture of left ear drum     Past Surgical History:  Procedure Laterality Date   ABDOMINAL HYSTERECTOMY  12/14/1978   Partial   BASAL CELL CARCINOMA EXCISION  2006,2008,2011   BUNIONECTOMY     BUNIONECTOMY Bilateral 12/14/1990   KNEE ARTHROSCOPY     KNEE CLOSED REDUCTION  05/07/2015   Procedure: CLOSED MANIPULATION KNEE;  Surgeon: Hessie Knows, MD;  Location: [facility] ORS;  Service: Orthopedics;;   KNEE SURGERY Bilateral 2005,2006   Repair   MOHS SURGERY     nose bcc, left cheeck Dr. Delice Lesch f/u Q 12/2020   REPLACEMENT TOTAL KNEE Left 04/11/2015   manipulation--05/07/2015   ROTATOR CUFF REPAIR W/ DISTAL CLAVICLE EXCISION     SHOULDER SURGERY Right    x2   SKIN CANCER EXCISION     TOTAL KNEE ARTHROPLASTY     VAGINAL HYSTERECTOMY      Prior to Admission medications   Medication Sig Start Date End Date Taking? Authorizing Provider  acetaminophen (TYLENOL) 500 MG tablet Take 1-2 tablets (500-1,000 mg total) by mouth every 4 (four) hours as needed for fever. 04/15/15  Yes Watt Climes, PA  amLODipine (NORVASC) 5 MG tablet Take 1 tablet (5 mg total) by mouth in the morning and at bedtime. BP >140/>90 07/04/21  Yes McLean-Scocuzza, Nino Glow, MD  aspirin 81 MG tablet Take 81 mg by mouth daily.   Yes [provider]  calcium carbonate (OS-CAL) 600 MG TABS tablet Take 600 mg by mouth daily.   Yes [provider]  carvedilol (COREG) 3.125 MG tablet Take 1 tablet (3.125 mg total) by mouth 2 (two) times  daily with a meal. 08/29/21  Yes Arnett, Yvetta Coder, FNP  Cholecalciferol (VITAMIN D) 2000 UNITS CAPS Take 1 capsule by mouth daily.   Yes [provider]  citalopram (CELEXA) 10 MG tablet Take 1 tablet (10 mg total) by mouth daily. D/c effexor 37.5 06/20/21  Yes McLean-Scocuzza, Nino Glow, MD  clobetasol cream (TEMOVATE) 7.42 % Apply 1 application topically 2 (two) times daily. prn 07/04/21  Yes McLean-Scocuzza, Nino Glow, MD  gabapentin (NEURONTIN) 100 MG capsule Take 1 capsule (100 mg total) by mouth at bedtime. 06/20/21  Yes McLean-Scocuzza, Nino Glow, MD  hydrALAZINE (APRESOLINE) 10 MG tablet TAKE 1 TABLET BY MOUTH THREE TIMES A DAY 10/06/21  Yes Flinchum, Kelby Aline, FNP  LORazepam (ATIVAN) 0.5 MG tablet Take 0.5-1 tablets (0.25-0.5 mg total) by mouth daily as needed for anxiety. 06/20/21  Yes McLean-Scocuzza, Nino Glow, MD  losartan (COZAAR) 100 MG tablet Take 1 tablet (100 mg total) by mouth daily. D/c 50 mg 06/20/21  Yes McLean-Scocuzza, Nino Glow, MD  Omega-3 Fatty Acids (FISH OIL) 1200 MG CAPS Take 1 capsule by mouth 2 (two) times daily.   Yes [provider]  omeprazole (PRILOSEC) 20 MG capsule TAKE 1 CAPSULE BY MOUTH TWICE A DAY 08/20/21  Yes Flinchum, Kelby Aline, FNP  vitamin B-12 (CYANOCOBALAMIN) 1000 MCG tablet Take  1,000 mcg by mouth daily.   Yes [provider]  simvastatin (ZOCOR) 20 MG tablet TAKE 1 TABLET BY MOUTH EVERY DAY AT 6PM 05/21/21   Flinchum, Kelby Aline, FNP    Allergies as of 11/17/2021 - Review Complete 11/05/2021  Allergen Reaction Noted   Adhesive [tape] Rash 04/12/2015    Family History  Problem Relation Age of Onset   Cancer Mother        lung   Hypertension Sister    Heart attack Sister 83   Breast cancer Paternal Aunt     Social History   Socioeconomic History   Marital status: Widowed    Spouse name: Not on file   Number of children: Not on file   Years of education: Not on file   Highest education level: Not on file  Occupational History    Not on file  Tobacco Use   Smoking status: Never   Smokeless tobacco: Never  Substance and Sexual Activity   Alcohol use: No   Drug use: No   Sexual activity: Never  Other Topics Concern   Not on file  Social History Narrative   Widowed x 12 years as of 06/2021    3 sisters and 1 brother    Has children son age 25, daughter 93, son 23 as of 06/2021   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 Neck:  Supple; no masses or thyromegaly. Lungs:  Clear throughout to auscultation.    Heart:  Regular rate and rhythm. Abdomen:  Soft, nontender and nondistended. Normal bowel sounds, without guarding, and without rebound.   Neurologic:  Alert and  oriented x4;  grossly normal neurologically.  Impression/Plan: Kristen Powell is now here to undergo a screening colonoscopy.  Risks, benefits, and alternatives regarding colonoscopy have been reviewed with the patient.  Questions have been answered.  All parties agreeable.

## 2021-12-09 NOTE — Op Note (Signed)
[facility] Gastroenterology Patient Name: Kristen Powell Procedure Date: 12/09/2021 9:39 AM MRN: [id number] Account #: [id number] Date of Birth: 02-08-1946 Admit Type: Outpatient Age: 75 Room: [facility] ENDO ROOM 4 Gender: Female Note Status: Finalized Instrument Name: Jasper Riling [id number] Procedure:             Colonoscopy Indications:           Screening for colorectal malignant neoplasm Providers:             Lucilla Lame MD, MD Referring MD:          Yvetta Coder. Arnett (Referring MD) Medicines:             Propofol per Anesthesia Complications:         No immediate complications. Procedure:             Pre-Anesthesia Assessment:                        - Prior to the procedure, a History and Physical was                         performed, and patient medications and allergies were                         reviewed. The patient's tolerance of previous                         anesthesia was also reviewed. The risks and benefits                         of the procedure and the sedation options and risks                         were discussed with the patient. All questions were                         answered, and informed consent was obtained. Prior                         Anticoagulants: The patient has taken no previous                         anticoagulant or antiplatelet agents. ASA Grade                         Assessment: II - A patient with mild systemic disease.                         After reviewing the risks and benefits, the patient                         was deemed in satisfactory condition to undergo the                         procedure.                        After obtaining informed consent, the colonoscope was  passed under direct vision. Throughout the procedure,                         the patient's blood pressure, pulse, and oxygen                         saturations were monitored continuously. The                          Colonoscope was introduced through the anus and                         advanced to the the cecum, identified by appendiceal                         orifice and ileocecal valve. The colonoscopy was                         performed without difficulty. The patient tolerated                         the procedure well. The quality of the bowel                         preparation was excellent. Findings:      The perianal and digital rectal examinations were normal.      Multiple small-mouthed diverticula were found in the sigmoid colon.      Non-bleeding internal hemorrhoids were found during retroflexion. The       hemorrhoids were Grade I (internal hemorrhoids that do not prolapse). Impression:            - Diverticulosis in the sigmoid colon.                        - Non-bleeding internal hemorrhoids.                        - No specimens collected. Recommendation:        - Discharge patient to home.                        - Resume previous diet.                        - Continue present medications.                        - Repeat colonoscopy is not recommended due to current                         age (36 years or older) for screening purposes. Procedure Code(s):     --- Professional ---                        [id number], Colonoscopy, flexible; diagnostic, including                         collection of specimen(s) by brushing or washing, when  performed (separate procedure) Diagnosis Code(s):     --- Professional ---                        Z12.11, Encounter for screening for malignant neoplasm                         of colon CPT copyright 2019 American Medical Association. All rights reserved. The codes documented in this report are preliminary and upon coder review may  be revised to meet current compliance requirements. Lucilla Lame MD, MD 12/09/2021 10:08:49 AM This report has been signed electronically. Number of Addenda: 0 Note Initiated On: 12/09/2021 9:39  AM Scope Withdrawal Time: 0 hours 9 minutes 40 seconds  Total Procedure Duration: 0 hours 13 minutes 27 seconds  Estimated Blood Loss:  Estimated blood loss: none.      [facility]

## 2021-12-09 NOTE — Anesthesia Postprocedure Evaluation (Signed)
Anesthesia Post Note  Patient: Kristen Powell  Procedure(s) Performed: COLONOSCOPY WITH PROPOFOL  Patient location during evaluation: Phase II Anesthesia Type: General Level of consciousness: awake and alert, awake and oriented Pain management: pain level controlled Vital Signs Assessment: post-procedure vital signs reviewed and stable Respiratory status: spontaneous breathing, nonlabored ventilation and respiratory function stable Cardiovascular status: blood pressure returned to baseline and stable Postop Assessment: no apparent nausea or vomiting Anesthetic complications: no   No notable events documented.   Last Vitals:  Vitals:   12/09/21 1021 12/09/21 1031  BP: (!) 143/77 129/66  Pulse:    Resp:    Temp:    SpO2:      Last Pain:  Vitals:   12/09/21 1031  TempSrc:   PainSc: 0-No pain                 Phill Mutter

## 2021-12-10 ENCOUNTER — Other Ambulatory Visit: Payer: Self-pay | Admitting: Internal Medicine

## 2021-12-10 ENCOUNTER — Encounter: Payer: Self-pay | Admitting: Gastroenterology

## 2021-12-10 DIAGNOSIS — Z1231 Encounter for screening mammogram for malignant neoplasm of breast: Secondary | ICD-10-CM

## 2021-12-12 ENCOUNTER — Other Ambulatory Visit: Payer: Self-pay

## 2021-12-12 ENCOUNTER — Ambulatory Visit (INDEPENDENT_AMBULATORY_CARE_PROVIDER_SITE_OTHER): Payer: Medicare Other | Admitting: Cardiology

## 2021-12-12 ENCOUNTER — Encounter: Payer: Self-pay | Admitting: Cardiology

## 2021-12-12 ENCOUNTER — Other Ambulatory Visit: Payer: Self-pay | Admitting: Family

## 2021-12-12 VITALS — BP 160/70 | HR 95 | Ht 61.0 in | Wt 168.0 lb

## 2021-12-12 DIAGNOSIS — E782 Mixed hyperlipidemia: Secondary | ICD-10-CM

## 2021-12-12 DIAGNOSIS — R011 Cardiac murmur, unspecified: Secondary | ICD-10-CM

## 2021-12-12 DIAGNOSIS — I10 Essential (primary) hypertension: Secondary | ICD-10-CM | POA: Diagnosis not present

## 2021-12-12 MED ORDER — HYDROCHLOROTHIAZIDE 25 MG PO TABS: 25.0000 mg | ORAL_TABLET | Freq: Every day | ORAL | 3 refills | Status: DC

## 2021-12-12 NOTE — Patient Instructions (Signed)
Medication Instructions:  Your physician has recommended you make the following change in your medication:   STOP taking hydralazine  START taking hydrochlorothiazide (Hydrodiuril) 25 mg daily   *If you need a refill on your cardiac medications before your next appointment, please call your pharmacy*   Lab Work: Your physician recommends that you return for a FASTING lipid profile: In 1 week. We will also draw a BMP at the same time.   - You will need to be fasting. Please do not have anything to eat or drink after midnight the morning you have the lab work. You may only have water or black coffee with no cream or sugar.   If you have labs (blood work) drawn today and your tests are completely normal, you will receive your results only by: [place] (if you have MyChart) OR A paper copy in the mail If you have any lab test that is abnormal or we need to change your treatment, we will call you to review the results.   Testing/Procedures:  Echocardiogram - Your physician has requested that you have an echocardiogram prior to your 6 week follow up.   Echocardiography is a painless test that uses sound waves to create images of your heart. It provides your doctor with information about the size and shape of your heart and how well your hearts chambers and valves are working. This procedure takes approximately one hour. There are no restrictions for this procedure.   Follow-Up: At [facility], you and your health needs are our priority.  As part of our continuing mission to provide you with exceptional heart care, we have created designated Provider Care Teams.  These Care Teams include your primary Cardiologist (physician) and Advanced Practice Providers (APPs -  Physician Assistants and Nurse Practitioners) who all work together to provide you with the care you need, when you need it.  We recommend signing up for the patient portal called "MyChart".  Sign up information is  provided on this After Visit Summary.  MyChart is used to connect with patients for Virtual Visits (Telemedicine).  Patients are able to view lab/test results, encounter notes, upcoming appointments, etc.  Non-urgent messages can be sent to your provider as well.   To learn more about what you can do with MyChart, go to [email or web address].    Your next appointment:   6 week(s)  The format for your next appointment:   In Person  Provider:   You may see Kate Sable, MD or one of the following Advanced Practice Providers on your designated Care Team:   Murray Hodgkins, NP Christell Faith, PA-C Cadence Kathlen Mody, PA-C  :1}    Other Instructions N/A

## 2021-12-12 NOTE — Progress Notes (Signed)
Cardiology Office Note:    Date:  12/12/2021   ID:  Rozann, Holts 13-Oct-1946, MRN [id number]  PCP:  Burnard Hawthorne, [facility] Providers Cardiologist:  Kate Sable, MD     Referring MD: Burnard Hawthorne, FNP   Chief Complaint  Patient presents with   NEW patient-Referred by Dr. Vidal Schwalbe for HTN    History of Present Illness:    Kristen Powell is a 75 y.o. female with a hx of hypertension, hyperlipidemia, anxiety who presents due to to control blood pressures.  Patient states having difficult to control BPs beginning last year.  Has tried several BP meds, and adjustments made.  Recently started on hydralazine 10 mg 3 times daily.  She denies chest pain, shortness of breath or palpitations.  Denies edema.  Denies any medication allergies.  Apart from occasionally being anxious, she feels okay.  BP at home ranges from 140s to 160s.  Renal arterial ultrasound 07/2021 with no evidence for stenosis.  Past Medical History:  Diagnosis Date   Anxiety    COVID-19    Hyperlipidemia    Hypertension    Psoriasis    younger age   Spinal stenosis    Traumatic rupture of left ear drum     Past Surgical History:  Procedure Laterality Date   ABDOMINAL HYSTERECTOMY  12/14/1978   Partial   BASAL CELL CARCINOMA EXCISION  2006,2008,2011   BUNIONECTOMY     BUNIONECTOMY Bilateral 12/14/1990   COLONOSCOPY WITH PROPOFOL N/A 12/09/2021   Procedure: COLONOSCOPY WITH PROPOFOL;  Surgeon: Lucilla Lame, MD;  Location: [facility] ENDOSCOPY;  Service: Endoscopy;  Laterality: N/A;   KNEE ARTHROSCOPY     KNEE CLOSED REDUCTION  05/07/2015   Procedure: CLOSED MANIPULATION KNEE;  Surgeon: Hessie Knows, MD;  Location: [facility] ORS;  Service: Orthopedics;;   KNEE SURGERY Bilateral 2005,2006   Repair   MOHS SURGERY     nose bcc, left cheeck Dr. Delice Lesch f/u Q 12/2020   REPLACEMENT TOTAL KNEE Left 04/11/2015   manipulation--05/07/2015   ROTATOR CUFF REPAIR W/ DISTAL CLAVICLE EXCISION      SHOULDER SURGERY Right    x2   SKIN CANCER EXCISION     TOTAL KNEE ARTHROPLASTY     VAGINAL HYSTERECTOMY      Current Medications: Current Meds  Medication Sig   acetaminophen (TYLENOL) 500 MG tablet Take 1-2 tablets (500-1,000 mg total) by mouth every 4 (four) hours as needed for fever.   amLODipine (NORVASC) 5 MG tablet Take 1 tablet (5 mg total) by mouth in the morning and at bedtime. BP >140/>90   aspirin 81 MG tablet Take 81 mg by mouth daily.   calcium carbonate (OS-CAL) 600 MG TABS tablet Take 600 mg by mouth daily.   carvedilol (COREG) 3.125 MG tablet Take 1 tablet (3.125 mg total) by mouth 2 (two) times daily with a meal.   Cholecalciferol (VITAMIN D) 2000 UNITS CAPS Take 1 capsule by mouth daily.   citalopram (CELEXA) 10 MG tablet Take 1 tablet (10 mg total) by mouth daily. D/c effexor 37.5   gabapentin (NEURONTIN) 100 MG capsule Take 1 capsule (100 mg total) by mouth at bedtime.   hydrochlorothiazide (HYDRODIURIL) 25 MG tablet Take 1 tablet (25 mg total) by mouth daily.   LORazepam (ATIVAN) 0.5 MG tablet Take 0.5-1 tablets (0.25-0.5 mg total) by mouth daily as needed for anxiety.   losartan (COZAAR) 100 MG tablet Take 1 tablet (100 mg total) by mouth daily. D/c 50  mg   Omega-3 Fatty Acids (FISH OIL) 1200 MG CAPS Take 1 capsule by mouth 2 (two) times daily.   omeprazole (PRILOSEC) 20 MG capsule TAKE 1 CAPSULE BY MOUTH TWICE A DAY   simvastatin (ZOCOR) 20 MG tablet TAKE 1 TABLET BY MOUTH EVERY DAY AT 6PM   vitamin B-12 (CYANOCOBALAMIN) 1000 MCG tablet Take 1,000 mcg by mouth daily.   [DISCONTINUED] hydrALAZINE (APRESOLINE) 10 MG tablet TAKE 1 TABLET BY MOUTH THREE TIMES A DAY   Current Facility-Administered Medications for the 12/12/21 encounter (Office Visit) with Kate Sable, MD  Medication   betamethasone acetate-betamethasone sodium phosphate (CELESTONE) injection 3 mg     Allergies:   Adhesive [tape]   Social History   Socioeconomic History   Marital  status: Widowed    Spouse name: Not on file   Number of children: Not on file   Years of education: Not on file   Highest education level: Not on file  Occupational History   Not on file  Tobacco Use   Smoking status: Never   Smokeless tobacco: Never  Vaping Use   Vaping Use: Never used  Substance and Sexual Activity   Alcohol use: No   Drug use: No   Sexual activity: Never  Other Topics Concern   Not on file  Social History Narrative   Widowed x 12 years as of 06/2021    3 sisters and 1 brother    Has children son age 81, daughter 35, son 25 as of 06/2021   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  ROS:   Please see the history of present illness.     All other systems reviewed and are negative.  EKGs/Labs/Other Studies Reviewed:    The following studies were reviewed today:   EKG:  EKG is  ordered today.  The ekg ordered today demonstrates normal sinus rhythm, normal ECG  Recent Labs: 04/29/2021: ALT 22; Hemoglobin 13.0; Platelets 225.0; TSH 4.28 08/29/2021: BUN 18; Creatinine, Ser 0.93; Potassium 4.7; Sodium 131  Recent Lipid Panel    Component Value Date/Time   CHOL 204 (H) 04/29/2021 0940   TRIG 206.0 (H) 04/29/2021 0940   HDL 54.00 04/29/2021 0940   CHOLHDL 4 04/29/2021 0940   VLDL 41.2 (H) 04/29/2021 0940   LDLCALC 88 08/08/2020 0909   LDLDIRECT 123.0 04/29/2021 0940     Risk Assessment/Calculations:          Physical Exam:    VS:  BP (!) 160/70 (BP Location: Right Arm, Patient Position: Sitting, Cuff Size: Large)   Pulse 95   Ht 5\' 1"   (1.549 m)   Wt 168 lb (76.2 kg)   SpO2 98%   BMI 31.74 kg/m     Wt Readings from Last 3 Encounters:  12/12/21 168 lb (76.2 kg)  12/09/21 170 lb (77.1 kg)  11/05/21 170 lb 6.4 oz (77.3 kg)     GEN:  Well nourished, well developed in no acute distress HEENT: Normal NECK: No JVD; No carotid bruits CARDIAC: RRR, 2/6 systolic murmur RESPIRATORY:  Clear  to auscultation without rales, wheezing or rhonchi  ABDOMEN: Soft, non-tender, non-distended MUSCULOSKELETAL:  No edema; No deformity  SKIN: Warm and dry NEUROLOGIC:  Alert and oriented x 3 PSYCHIATRIC:  Normal affect   ASSESSMENT:    1. Primary hypertension   2. Mixed hyperlipidemia   3. Systolic murmur    PLAN:    In order of problems listed above:  Hypertension, BP elevated.  Start HCTZ 25 mg daily, check BMP in 1 week.  Continue Norvasc 10 mg daily, losartan 100 mg daily, Coreg 3.125 mg twice daily.  Stop hydralazine.  If BP elevated at follow-up visit, plan to titrate dose of carvedilol.  Renal artery ultrasound with no evidence for stenosis. Hyperlipidemia, repeat fasting lipid profile.  Continue simvastatin 20 mg daily for now. Systolic murmur on exam, obtain echo to evaluate any valvular abnormalities.  Follow-up in 6 weeks.       Medication Adjustments/Labs and Tests Ordered: Current medicines are reviewed at length with the patient today.  Concerns regarding medicines are outlined above.  Orders Placed This Encounter  Procedures   Basic Metabolic Panel (BMET)   Lipid Profile   EKG 12-Lead   ECHOCARDIOGRAM COMPLETE   Meds ordered this encounter  Medications   hydrochlorothiazide (HYDRODIURIL) 25 MG tablet    Sig: Take 1 tablet (25 mg total) by mouth daily.    Dispense:  90 tablet    Refill:  3    Patient Instructions  Medication Instructions:  Your physician has recommended you make the following change in your medication:   STOP taking hydralazine  START taking hydrochlorothiazide (Hydrodiuril) 25  mg daily   *If you need a refill on your cardiac medications before your next appointment, please call your pharmacy*   Lab Work: Your physician recommends that you return for a FASTING lipid profile: In 1 week. We will also draw a BMP at the same time.   - You will need to be fasting. Please do not have anything to eat or drink after midnight the morning you have the lab work. You may only have water or black coffee with no cream or sugar.   If you have labs (blood work) drawn today and your tests are completely normal, you will receive your results only by: [place] (if you have MyChart) OR A paper copy in the mail If you have any lab test that is abnormal or we need to change your treatment, we will call you to review the results.   Testing/Procedures:  Echocardiogram - Your physician has requested that you have an echocardiogram prior to your 6 week follow up.   Echocardiography is a painless test that uses sound waves to create images of your heart. It provides your doctor with information about the size and shape of your heart and how well your heart's chambers and valves are working. This procedure takes approximately one hour. There are no restrictions for this procedure.   Follow-Up: At [facility], you and your health needs are our priority.  As part of our continuing mission to provide you with exceptional heart care, we have created designated Provider Care Teams.  These Care Teams include your primary Cardiologist (physician) and Advanced Practice Providers (APPs -  Physician Assistants and Nurse Practitioners) who all work together to provide you with the care you need, when you need it.  We recommend signing up for the patient portal called "MyChart".  Sign up information is provided on this After Visit Summary.  MyChart is used  to connect with patients for Virtual Visits (Telemedicine).  Patients are able to view lab/test results, encounter notes, upcoming  appointments, etc.  Non-urgent messages can be sent to your provider as well.   To learn more about what you can do with MyChart, go to [email or web address].    Your next appointment:   6 week(s)  The format for your next appointment:   In Person  Provider:   You may see Kate Sable, MD or one of the following Advanced Practice Providers on your designated Care Team:   Murray Hodgkins, NP Christell Faith, PA-C Cadence Kathlen Mody, PA-C  :1}    Other Instructions N/A    Signed, Kate Sable, MD  12/12/2021 4:58 PM    [place]

## 2021-12-19 ENCOUNTER — Encounter: Payer: Self-pay | Admitting: Family

## 2021-12-19 ENCOUNTER — Other Ambulatory Visit (INDEPENDENT_AMBULATORY_CARE_PROVIDER_SITE_OTHER): Payer: Medicare Other

## 2021-12-19 ENCOUNTER — Ambulatory Visit (INDEPENDENT_AMBULATORY_CARE_PROVIDER_SITE_OTHER): Payer: Medicare Other | Admitting: Family

## 2021-12-19 ENCOUNTER — Other Ambulatory Visit: Payer: Self-pay

## 2021-12-19 DIAGNOSIS — E782 Mixed hyperlipidemia: Secondary | ICD-10-CM | POA: Diagnosis not present

## 2021-12-19 DIAGNOSIS — I10 Essential (primary) hypertension: Secondary | ICD-10-CM

## 2021-12-19 NOTE — Patient Instructions (Signed)
We recommend you get the updated bivalent COVID-19 booster, at least 2 months after any prior doses. You may consider delaying a booster dose by 3 months from a prior episode of COVID-19 per the CDC.   You can find pharmacies that have this formulation in stock at [email or web address]   Nice to see you!

## 2021-12-19 NOTE — Progress Notes (Signed)
Subjective:    Patient ID: Kristen Powell, female    DOB: 04/16/1946, 76 y.o.   MRN: [id number]  CC: Kristen Powell is a 76 y.o. female who presents today for follow up.   HPI: She feels well today She is riding her exercise bike. No cp, sob, dizziness.  Mammogram scheduled   She reports that she has only taken losartan this morning.  Seen by Dr Charlestine Night 12/12/21 for HTN.  He started hydrochlorothiazide 25 mg.  She was continued on Norvasc 10 mg, losartan 100 mg, Coreg 3.125 mg twice daily.Discontinued hydralazine.  Pending echocardiogram which is scheduled. HISTORY:  Past Medical History:  Diagnosis Date   Anxiety    COVID-19    Hyperlipidemia    Hypertension    Psoriasis    younger age   Spinal stenosis    Traumatic rupture of left ear drum    Past Surgical History:  Procedure Laterality Date   ABDOMINAL HYSTERECTOMY  12/14/1978   Partial   BASAL CELL CARCINOMA EXCISION  2006,2008,2011   BUNIONECTOMY     BUNIONECTOMY Bilateral 12/14/1990   COLONOSCOPY WITH PROPOFOL N/A 12/09/2021   Procedure: COLONOSCOPY WITH PROPOFOL;  Surgeon: Lucilla Lame, MD;  Location: [facility] ENDOSCOPY;  Service: Endoscopy;  Laterality: N/A;   KNEE ARTHROSCOPY     KNEE CLOSED REDUCTION  05/07/2015   Procedure: CLOSED MANIPULATION KNEE;  Surgeon: Hessie Knows, MD;  Location: [facility] ORS;  Service: Orthopedics;;   KNEE SURGERY Bilateral 2005,2006   Repair   MOHS SURGERY     nose bcc, left cheeck Dr. Delice Lesch f/u Q 12/2020   REPLACEMENT TOTAL KNEE Left 04/11/2015   manipulation--05/07/2015   ROTATOR CUFF REPAIR W/ DISTAL CLAVICLE EXCISION     SHOULDER SURGERY Right    x2   SKIN CANCER EXCISION     TOTAL KNEE ARTHROPLASTY     VAGINAL HYSTERECTOMY     Family History  Problem Relation Age of Onset   Cancer Mother        lung   Hypertension Sister    Heart attack Sister 73   Breast cancer Paternal Aunt     Allergies: Adhesive [tape] Current Outpatient Medications on File Prior to Visit   Medication Sig Dispense Refill   acetaminophen (TYLENOL) 500 MG tablet Take 1-2 tablets (500-1,000 mg total) by mouth every 4 (four) hours as needed for fever. 30 tablet 0   amLODipine (NORVASC) 5 MG tablet Take 1 tablet (5 mg total) by mouth in the morning and at bedtime. BP >140/>90 180 tablet 3   aspirin 81 MG tablet Take 81 mg by mouth daily.     calcium carbonate (OS-CAL) 600 MG TABS tablet Take 600 mg by mouth daily.     carvedilol (COREG) 3.125 MG tablet Take 1 tablet (3.125 mg total) by mouth 2 (two) times daily with a meal. 120 tablet 3   Cholecalciferol (VITAMIN D) 2000 UNITS CAPS Take 1 capsule by mouth daily.     citalopram (CELEXA) 10 MG tablet Take 1 tablet (10 mg total) by mouth daily. D/c effexor 37.5 90 tablet 3   gabapentin (NEURONTIN) 100 MG capsule Take 1 capsule (100 mg total) by mouth at bedtime. 90 capsule 3   hydrochlorothiazide (HYDRODIURIL) 25 MG tablet Take 1 tablet (25 mg total) by mouth daily. 90 tablet 3   LORazepam (ATIVAN) 0.5 MG tablet Take 0.5-1 tablets (0.25-0.5 mg total) by mouth daily as needed for anxiety. 30 tablet 2   losartan (COZAAR) 100 MG tablet Take  1 tablet (100 mg total) by mouth daily. D/c 50 mg 90 tablet 3   Omega-3 Fatty Acids (FISH OIL) 1200 MG CAPS Take 1 capsule by mouth 2 (two) times daily.     omeprazole (PRILOSEC) 20 MG capsule TAKE 1 CAPSULE BY MOUTH TWICE A DAY 180 capsule 0   simvastatin (ZOCOR) 20 MG tablet TAKE 1 TABLET BY MOUTH EVERY DAY AT 6PM 90 tablet 2   vitamin B-12 (CYANOCOBALAMIN) 1000 MCG tablet Take 1,000 mcg by mouth daily.     Current Facility-Administered Medications on File Prior to Visit  Medication Dose Route Frequency Provider Last Rate Last Admin   betamethasone acetate-betamethasone sodium phosphate (CELESTONE) injection 3 mg  3 mg Intra-articular Once Edrick Kins, DPM        Social History   Tobacco Use   Smoking status: Never   Smokeless tobacco: Never  Vaping Use   Vaping Use: Never used  Substance  Use Topics   Alcohol use: No   Drug use: No    Review of Systems  Constitutional:  Negative for chills and fever.  Respiratory:  Negative for cough.   Cardiovascular:  Negative for chest pain and palpitations.  Gastrointestinal:  Negative for nausea and vomiting.     Objective:    BP 140/62 (BP Location: Right Arm, Patient Position: Sitting, Cuff Size: Large)    Pulse 81    Temp 98.2 F (36.8 C) (Oral)    Ht 5\' 1"  (1.549 m)    Wt 166 lb 6.4 oz (75.5 kg)    SpO2 98%    BMI 31.44 kg/m  BP Readings from Last 3 Encounters:  12/19/21 140/62  12/12/21 (!) 160/70  12/09/21 129/66   Wt Readings from Last 3 Encounters:  12/19/21 166 lb 6.4 oz (75.5 kg)  12/12/21 168 lb (76.2 kg)  12/09/21 170 lb (77.1 kg)    Physical Exam Vitals reviewed.  Constitutional:      Appearance: She is well-developed.  Eyes:     Conjunctiva/sclera: Conjunctivae normal.  Cardiovascular:     Rate and Rhythm: Normal rate and regular rhythm.     Pulses: Normal pulses.     Heart sounds: Normal heart sounds.  Pulmonary:     Effort: Pulmonary effort is normal.     Breath sounds: Normal breath sounds. No wheezing, rhonchi or rales.  Skin:    General: Skin is warm and dry.  Neurological:     Mental Status: She is alert.  Psychiatric:        Speech: Speech normal.        Behavior: Behavior normal.        Thought Content: Thought content normal.       Assessment & Plan:   Problem List Items Addressed This Visit       Cardiovascular and Mediastinum   Essential hypertension    Much improved. Pleased with reading today. DBP low end so I would not escalate regimen at this time. Continue hydrochlorothiazide 25 mg,Norvasc 10 mg, losartan 100 mg, Coreg 3.125 mg twice daily. Close follow up.         I am having [name] maintain her aspirin, Vitamin D, calcium carbonate, vitamin B-12, Fish Oil, acetaminophen, simvastatin, losartan, citalopram, LORazepam, gabapentin, amLODipine, omeprazole,  carvedilol, and hydrochlorothiazide. We will continue to administer betamethasone acetate-betamethasone sodium phosphate.   No orders of the defined types were placed in this encounter.   Return precautions given.   Risks, benefits, and alternatives of the medications and  treatment plan prescribed today were discussed, and patient expressed understanding.   Education regarding symptom management and diagnosis given to patient on AVS.  Continue to follow with Burnard Hawthorne, FNP for routine health maintenance.   Kristen Powell and I agreed with plan.   Mable Paris, FNP

## 2021-12-19 NOTE — Assessment & Plan Note (Signed)
Much improved. Pleased with reading today. DBP low end so I would not escalate regimen at this time. Continue hydrochlorothiazide 25 mg,Norvasc 10 mg, losartan 100 mg, Coreg 3.125 mg twice daily. Close follow up.

## 2021-12-20 LAB — BASIC METABOLIC PANEL
BUN/Creatinine Ratio: 16 (ref 12–28)
BUN: 18 mg/dL (ref 8–27)
CO2: 24 mmol/L (ref 20–29)
Calcium: 9.4 mg/dL (ref 8.7–10.3)
Chloride: 101 mmol/L (ref 96–106)
Creatinine, Ser: 1.16 mg/dL — ABNORMAL HIGH (ref 0.57–1.00)
Glucose: 92 mg/dL (ref 70–99)
Potassium: 4.6 mmol/L (ref 3.5–5.2)
Sodium: 141 mmol/L (ref 134–144)
eGFR: 49 mL/min/{1.73_m2} — ABNORMAL LOW (ref >59)

## 2021-12-20 LAB — LIPID PANEL
Chol/HDL Ratio: 3.8 ratio (ref 0.0–4.4)
Cholesterol, Total: 183 mg/dL (ref 100–199)
HDL: 48 mg/dL (ref >39)
LDL Chol Calc (NIH): 96 mg/dL (ref 0–99)
Triglycerides: 231 mg/dL — ABNORMAL HIGH (ref 0–149)
VLDL Cholesterol Cal: 39 mg/dL (ref 5–40)

## 2021-12-23 DIAGNOSIS — L821 Other seborrheic keratosis: Secondary | ICD-10-CM | POA: Diagnosis not present

## 2021-12-23 DIAGNOSIS — Z85828 Personal history of other malignant neoplasm of skin: Secondary | ICD-10-CM | POA: Diagnosis not present

## 2021-12-23 DIAGNOSIS — D225 Melanocytic nevi of trunk: Secondary | ICD-10-CM | POA: Diagnosis not present

## 2021-12-23 DIAGNOSIS — L708 Other acne: Secondary | ICD-10-CM | POA: Diagnosis not present

## 2021-12-23 DIAGNOSIS — L814 Other melanin hyperpigmentation: Secondary | ICD-10-CM | POA: Diagnosis not present

## 2021-12-23 DIAGNOSIS — Z08 Encounter for follow-up examination after completed treatment for malignant neoplasm: Secondary | ICD-10-CM | POA: Diagnosis not present

## 2021-12-24 ENCOUNTER — Telehealth: Payer: Self-pay

## 2021-12-24 DIAGNOSIS — I10 Essential (primary) hypertension: Secondary | ICD-10-CM

## 2021-12-24 MED ORDER — FENOFIBRATE 145 MG PO TABS: 145.0000 mg | ORAL_TABLET | Freq: Every day | ORAL | 5 refills | Status: DC

## 2021-12-24 NOTE — Telephone Encounter (Signed)
-----   Message from Kate Sable, MD sent at 12/23/2021 10:53 AM EST ----- Creatinine slightly abnormal, possibly from dehydration with starting HCTZ.  Recheck BMP in 2 weeks.  Encourage hydration.

## 2021-12-24 NOTE — Telephone Encounter (Signed)
-----   Message from Kate Sable, MD sent at 12/23/2021 10:54 AM EST ----- Triglycerides still elevated, start fenofibrate if patient agreeable.

## 2021-12-24 NOTE — Telephone Encounter (Signed)
Called patient and discussed the result notes as documented below. Lab scheduled 2 weeks out, patient is willing to start Fenofibrate, prescription sent in. Patient verbalized understanding of plan and was grateful for the follow up.  Patient also stated that she has been feeling so much better since starting HCTZ, and that her BP has been great.

## 2022-01-05 ENCOUNTER — Other Ambulatory Visit (INDEPENDENT_AMBULATORY_CARE_PROVIDER_SITE_OTHER): Payer: Medicare Other

## 2022-01-05 ENCOUNTER — Other Ambulatory Visit: Payer: Self-pay

## 2022-01-05 DIAGNOSIS — I10 Essential (primary) hypertension: Secondary | ICD-10-CM | POA: Diagnosis not present

## 2022-01-06 LAB — BASIC METABOLIC PANEL
BUN/Creatinine Ratio: 18 (ref 12–28)
BUN: 17 mg/dL (ref 8–27)
CO2: 24 mmol/L (ref 20–29)
Calcium: 9.2 mg/dL (ref 8.7–10.3)
Chloride: 92 mmol/L — ABNORMAL LOW (ref 96–106)
Creatinine, Ser: 0.93 mg/dL (ref 0.57–1.00)
Glucose: 91 mg/dL (ref 70–99)
Potassium: 4.5 mmol/L (ref 3.5–5.2)
Sodium: 129 mmol/L — ABNORMAL LOW (ref 134–144)
eGFR: 64 mL/min/{1.73_m2} (ref >59)

## 2022-01-07 ENCOUNTER — Other Ambulatory Visit: Payer: Self-pay | Admitting: Family

## 2022-01-07 ENCOUNTER — Ambulatory Visit
Admission: RE | Admit: 2022-01-07 | Discharge: 2022-01-07 | Disposition: A | Discharge status: Home / Self Care | Payer: Medicare Other | Source: Ambulatory Visit | Attending: Internal Medicine | Admitting: Internal Medicine

## 2022-01-07 DIAGNOSIS — Z1231 Encounter for screening mammogram for malignant neoplasm of breast: Secondary | ICD-10-CM | POA: Diagnosis not present

## 2022-01-09 ENCOUNTER — Other Ambulatory Visit: Payer: Self-pay

## 2022-01-09 ENCOUNTER — Ambulatory Visit (INDEPENDENT_AMBULATORY_CARE_PROVIDER_SITE_OTHER): Payer: Medicare Other

## 2022-01-09 DIAGNOSIS — R011 Cardiac murmur, unspecified: Secondary | ICD-10-CM | POA: Diagnosis not present

## 2022-01-09 LAB — ECHOCARDIOGRAM COMPLETE
AR max vel: 2.02 cm2
AV Area VTI: 1.94 cm2
AV Area mean vel: 1.84 cm2
AV Mean grad: 8.3 mmHg
AV Peak grad: 13.7 mmHg
Ao pk vel: 1.85 m/s
Area-P 1/2: 3.06 cm2
Calc EF: 64.6 %
MV VTI: 2.45 cm2
S' Lateral: 2.7 cm
Single Plane A2C EF: 76.2 %
Single Plane A4C EF: 50.9 %

## 2022-01-23 ENCOUNTER — Other Ambulatory Visit: Payer: Self-pay

## 2022-01-23 ENCOUNTER — Ambulatory Visit (INDEPENDENT_AMBULATORY_CARE_PROVIDER_SITE_OTHER): Payer: Medicare Other | Admitting: Cardiology

## 2022-01-23 ENCOUNTER — Encounter: Payer: Self-pay | Admitting: Cardiology

## 2022-01-23 VITALS — BP 140/70 | HR 70 | Ht 61.0 in | Wt 165.0 lb

## 2022-01-23 DIAGNOSIS — I10 Essential (primary) hypertension: Secondary | ICD-10-CM | POA: Diagnosis not present

## 2022-01-23 DIAGNOSIS — E782 Mixed hyperlipidemia: Secondary | ICD-10-CM

## 2022-01-23 DIAGNOSIS — R011 Cardiac murmur, unspecified: Secondary | ICD-10-CM

## 2022-01-23 MED ORDER — AMLODIPINE BESYLATE 10 MG PO TABS: 10.0000 mg | ORAL_TABLET | Freq: Every day | ORAL | 3 refills | Status: DC

## 2022-01-23 NOTE — Progress Notes (Signed)
Cardiology Office Note:    Date:  01/23/2022   ID:  Kristen Powell, DOB 30-Apr-1946, MRN [id number]  PCP:  Burnard Hawthorne, FNP   [facility] Providers Cardiologist:  Kate Sable, MD     Referring MD: Burnard Hawthorne, FNP   Chief Complaint  Patient presents with   Other    6 week follow up post ECHO -- Meds reviewed verbally with patient.     History of Present Illness:    Kristen Powell is a 76 y.o. female with a hx of hypertension, hyperlipidemia, anxiety who presents for follow-up.  Previously seen due to elevated blood pressures.  Her medications were adjusted/consolidated.  Repeat cholesterol/lipid panel was obtained.  She states her blood pressure is much improved, current readings averaging 120s to 130s at home.  She also sleeps much better.  States being anxious but that is also controlled.    Prior notes Renal arterial ultrasound 07/2021 with no evidence for stenosis.  Past Medical History:  Diagnosis Date   Anxiety    COVID-19    Hyperlipidemia    Hypertension    Psoriasis    younger age   Spinal stenosis    Traumatic rupture of left ear drum     Past Surgical History:  Procedure Laterality Date   ABDOMINAL HYSTERECTOMY  12/14/1978   Partial   BASAL CELL CARCINOMA EXCISION  2006,2008,2011   BUNIONECTOMY     BUNIONECTOMY Bilateral 12/14/1990   COLONOSCOPY WITH PROPOFOL N/A 12/09/2021   Procedure: COLONOSCOPY WITH PROPOFOL;  Surgeon: Lucilla Lame, MD;  Location: [facility] ENDOSCOPY;  Service: Endoscopy;  Laterality: N/A;   KNEE ARTHROSCOPY     KNEE CLOSED REDUCTION  05/07/2015   Procedure: CLOSED MANIPULATION KNEE;  Surgeon: Hessie Knows, MD;  Location: [facility] ORS;  Service: Orthopedics;;   KNEE SURGERY Bilateral 2005,2006   Repair   MOHS SURGERY     nose bcc, left cheeck Dr. Delice Lesch f/u Q 12/2020   REPLACEMENT TOTAL KNEE Left 04/11/2015   manipulation--05/07/2015   ROTATOR CUFF REPAIR W/ DISTAL CLAVICLE EXCISION     SHOULDER SURGERY Right     x2   SKIN CANCER EXCISION     TOTAL KNEE ARTHROPLASTY     VAGINAL HYSTERECTOMY      Current Medications: Current Meds  Medication Sig   acetaminophen (TYLENOL) 500 MG tablet Take 1-2 tablets (500-1,000 mg total) by mouth every 4 (four) hours as needed for fever.   amLODipine (NORVASC) 10 MG tablet Take 1 tablet (10 mg total) by mouth daily.   aspirin 81 MG tablet Take 81 mg by mouth daily.   calcium carbonate (OS-CAL) 600 MG TABS tablet Take 600 mg by mouth daily.   carvedilol (COREG) 3.125 MG tablet Take 1 tablet (3.125 mg total) by mouth 2 (two) times daily with a meal.   Cholecalciferol (VITAMIN D) 2000 UNITS CAPS Take 1 capsule by mouth daily.   citalopram (CELEXA) 10 MG tablet Take 1 tablet (10 mg total) by mouth daily. D/c effexor 37.5   fenofibrate (TRICOR) 145 MG tablet Take 1 tablet (145 mg total) by mouth daily.   gabapentin (NEURONTIN) 100 MG capsule Take 1 capsule (100 mg total) by mouth at bedtime.   hydrochlorothiazide (HYDRODIURIL) 25 MG tablet Take 1 tablet (25 mg total) by mouth daily.   LORazepam (ATIVAN) 0.5 MG tablet Take 0.5-1 tablets (0.25-0.5 mg total) by mouth daily as needed for anxiety.   losartan (COZAAR) 100 MG tablet Take 1 tablet (100 mg total) by  mouth daily. D/c 50 mg   Omega-3 Fatty Acids (FISH OIL) 1200 MG CAPS Take 1 capsule by mouth 2 (two) times daily.   omeprazole (PRILOSEC) 20 MG capsule TAKE 1 CAPSULE BY MOUTH TWICE A DAY   simvastatin (ZOCOR) 20 MG tablet TAKE 1 TABLET BY MOUTH EVERY DAY AT 6PM   vitamin B-12 (CYANOCOBALAMIN) 1000 MCG tablet Take 1,000 mcg by mouth daily.   [DISCONTINUED] amLODipine (NORVASC) 5 MG tablet Take 1 tablet (5 mg total) by mouth in the morning and at bedtime. BP >140/>90   Current Facility-Administered Medications for the 01/23/22 encounter (Office Visit) with Kate Sable, MD  Medication   betamethasone acetate-betamethasone sodium phosphate (CELESTONE) injection 3 mg     Allergies:   Adhesive [tape]    Social History   Socioeconomic History   Marital status: Widowed    Spouse name: Not on file   Number of children: Not on file   Years of education: Not on file   Highest education level: Not on file  Occupational History   Not on file  Tobacco Use   Smoking status: Never   Smokeless tobacco: Never  Vaping Use   Vaping Use: Never used  Substance and Sexual Activity   Alcohol use: No   Drug use: No   Sexual activity: Never  Other Topics Concern   Not on file  Social History Narrative   Widowed x 12 years as of 06/2021    3 sisters and 1 brother    Has children son age 51, daughter 89, son 43 as of 06/2021   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  ROS:   Please see the history of present illness.     All other systems reviewed and are negative.  EKGs/Labs/Other Studies Reviewed:    The following studies were reviewed today:   EKG:  EKG not ordered today.    Recent Labs: 04/29/2021: ALT 22; Hemoglobin 13.0; Platelets 225.0; TSH 4.28 01/05/2022: BUN 17; Creatinine, Ser 0.93; Potassium 4.5; Sodium 129  Recent Lipid Panel    Component Value Date/Time   CHOL 183 12/19/2021 0814   TRIG 231 (H) 12/19/2021 0814   HDL 48 12/19/2021 0814   CHOLHDL 3.8 12/19/2021 0814   CHOLHDL 4 04/29/2021 0940   VLDL 41.2 (H) 04/29/2021 0940   LDLCALC 96 12/19/2021 0814   LDLDIRECT 123.0 04/29/2021 0940     Risk Assessment/Calculations:          Physical Exam:    VS:  BP 140/70 (BP Location: Left Arm, Patient Position: Sitting, Cuff Size: Normal)    Pulse 70    Ht 5'  1" (1.549 m)    Wt 165 lb (74.8 kg)    SpO2 99%    BMI 31.18 kg/m     Wt Readings from Last 3 Encounters:  01/23/22 165 lb (74.8 kg)  12/19/21 166 lb 6.4 oz (75.5 kg)  12/12/21 168 lb (76.2 kg)     GEN:  Well nourished, well developed in no acute distress HEENT: Normal NECK: No JVD; No  carotid bruits CARDIAC: RRR, 2/6 systolic murmur RESPIRATORY:  Clear to auscultation without rales, wheezing or rhonchi  ABDOMEN: Soft, non-tender, non-distended MUSCULOSKELETAL:  No edema; No deformity  SKIN: Warm and dry NEUROLOGIC:  Alert and oriented x 3 PSYCHIATRIC:  Normal affect   ASSESSMENT:    1. Primary hypertension   2. Mixed hyperlipidemia   3. Systolic murmur     PLAN:    In order of problems listed above:  Hypertension, BP elevated today, improved at home.  May have a component of whitecoat syndrome.  Continue Coreg 3.125 mg twice daily, continue HCTZ 25 mg daily, Norvasc 10 mg daily, losartan 100 mg daily.   Renal artery ultrasound with no evidence for stenosis. Hyperlipidemia, fasting lipid profile with elevated triglycerides.  Start fenofibrate 145 mg daily.  Repeat lipid panel in 3 months. Systolic murmur on exam, echo shows normal systolic function EF 60 to 65%, mild MR, no significant valvular abnormalities.  Patient made aware of results.  Follow-up in 3 months.       Medication Adjustments/Labs and Tests Ordered: Current medicines are reviewed at length with the patient today.  Concerns regarding medicines are outlined above.  Orders Placed This Encounter  Procedures   Lipid panel   Lipid panel   Meds ordered this encounter  Medications   amLODipine (NORVASC) 10 MG tablet    Sig: Take 1 tablet (10 mg total) by mouth daily.    Dispense:  90 tablet    Refill:  3    Patient Instructions  Medication Instructions:   Your physician has recommended you make the following change in your medication:  CHANGE Amlodipine - Take one tablet (10mg ) by mouth daily.    *If you need a refill on your cardiac medications before your next appointment, please call your pharmacy*   Lab Work:  Your physician recommends that you return for a FASTING lipid profile: The week prior to your 3 month follow up.  - You will need to be fasting. Please do not have anything to eat or drink after midnight the morning you have the lab work. You may only have water or black coffee with no cream or sugar.   We will call you closer to your appointment time and schedule you in our office for this lab draw.  If you have labs (blood work) drawn today and your tests are completely normal, you will receive your results only by: [place] (if you have MyChart) OR A paper copy in the mail If you have any lab test that is abnormal or we need to change your treatment, we will call you to review the results.   Testing/Procedures:  None Ordered   Follow-Up: At [facility], you and your health needs are our priority.  As part of our continuing mission to provide you with exceptional heart care, we have created designated Provider Care Teams.  These Care Teams include your primary Cardiologist (physician) and Advanced Practice Providers (APPs -  Physician Assistants and Nurse Practitioners) who all work together to provide you with the care you need, when you need it.  We recommend signing up for the patient portal called "MyChart".  Sign up information is provided on this After Visit Summary.  MyChart is used to connect with patients for Virtual Visits (Telemedicine).  Patients are able to view lab/test results, encounter notes, upcoming appointments, etc.  Non-urgent messages can be sent to your provider as well.   To learn more about what you can do  with MyChart, go to [email or web address].    Your next appointment:   3 month(s)  The format for your next appointment:   In Person  Provider:   You may see Kate Sable, MD or one of the following Advanced  Practice Providers on your designated Care Team:   Murray Hodgkins, NP Christell Faith, PA-C Cadence Kathlen Mody, PA-C{     Signed, Kate Sable, MD  01/23/2022 1:08 PM    [place]

## 2022-01-23 NOTE — Patient Instructions (Signed)
Medication Instructions:   Your physician has recommended you make the following change in your medication:  CHANGE Amlodipine - Take one tablet (10mg ) by mouth daily.   *If you need a refill on your cardiac medications before your next appointment, please call your pharmacy*   Lab Work:  Your physician recommends that you return for a FASTING lipid profile: The week prior to your 3 month follow up.  - You will need to be fasting. Please do not have anything to eat or drink after midnight the morning you have the lab work. You may only have water or black coffee with no cream or sugar.   We will call you closer to your appointment time and schedule you in our office for this lab draw.  If you have labs (blood work) drawn today and your tests are completely normal, you will receive your results only by: [place] (if you have MyChart) OR A paper copy in the mail If you have any lab test that is abnormal or we need to change your treatment, we will call you to review the results.   Testing/Procedures:  None Ordered   Follow-Up: At [facility], you and your health needs are our priority.  As part of our continuing mission to provide you with exceptional heart care, we have created designated Provider Care Teams.  These Care Teams include your primary Cardiologist (physician) and Advanced Practice Providers (APPs -  Physician Assistants and Nurse Practitioners) who all work together to provide you with the care you need, when you need it.  We recommend signing up for the patient portal called "MyChart".  Sign up information is provided on this After Visit Summary.  MyChart is used to connect with patients for Virtual Visits (Telemedicine).  Patients are able to view lab/test results, encounter notes, upcoming appointments, etc.  Non-urgent messages can be sent to your provider as well.   To learn more about what you can do with MyChart, go to [email or web address].    Your  next appointment:   3 month(s)  The format for your next appointment:   In Person  Provider:   You may see Kate Sable, MD or one of the following Advanced Practice Providers on your designated Care Team:   Murray Hodgkins, NP Christell Faith, PA-C Cadence Kathlen Mody, [place]

## 2022-02-04 ENCOUNTER — Encounter: Payer: Self-pay | Admitting: Internal Medicine

## 2022-02-04 ENCOUNTER — Other Ambulatory Visit: Payer: Self-pay

## 2022-02-04 ENCOUNTER — Ambulatory Visit (INDEPENDENT_AMBULATORY_CARE_PROVIDER_SITE_OTHER): Payer: Medicare Other | Admitting: Internal Medicine

## 2022-02-04 ENCOUNTER — Other Ambulatory Visit: Payer: Medicare Other

## 2022-02-04 VITALS — BP 132/80 | HR 68 | Ht 61.0 in | Wt 166.0 lb

## 2022-02-04 DIAGNOSIS — E871 Hypo-osmolality and hyponatremia: Secondary | ICD-10-CM | POA: Diagnosis not present

## 2022-02-04 NOTE — Patient Instructions (Addendum)
-   STOP Hydrochlorothiazide  - Limit water intake from 4 bottles a day  to 2-3 bottles a day ( Preferably 2 bottles)

## 2022-02-04 NOTE — Progress Notes (Signed)
Name: Kristen Powell  MRN/ DOB: [id number], 09/17/1946    Age/ Sex: 76 y.o., female    PCP: Burnard Hawthorne, FNP   Reason for Endocrinology Evaluation: Hyponatremia      Date of Initial Endocrinology Evaluation: 02/04/2022     HPI: Kristen Powell is a 76 y.o. female with a past medical history of HTN, GERD and DJD. The patient presented for initial endocrinology clinic visit on 02/04/2022 for consultative assistance with her Hyponatremia .   Pt has been noted with intermittent hyponatremia since 2016 with a nadir of 128 mmol/L in 2016.    The pt is on HCTZ for HTN for 6 weeks due to LE edema per pt. Echo showed MR, EF 60-65%  Weight stable  She has been using less salt on her diet  She drinks 4 bottles of water a day plus 3-4 cups of coffee, occasional juice   Denies dizziness Denies unsteady gait unless making a quick turn  Denies nausea  LE edema has improved    She has never smoked  Denies head injury  Denies fatigue and feels her energy has improved  Nocturia x1    HISTORY:  Past Medical History:  Past Medical History:  Diagnosis Date   Anxiety    COVID-19    Hyperlipidemia    Hypertension    Psoriasis    younger age   Spinal stenosis    Traumatic rupture of left ear drum    Past Surgical History:  Past Surgical History:  Procedure Laterality Date   ABDOMINAL HYSTERECTOMY  12/14/1978   Partial   BASAL CELL CARCINOMA EXCISION  2006,2008,2011   BUNIONECTOMY     BUNIONECTOMY Bilateral 12/14/1990   COLONOSCOPY WITH PROPOFOL N/A 12/09/2021   Procedure: COLONOSCOPY WITH PROPOFOL;  Surgeon: Lucilla Lame, MD;  Location: [facility] ENDOSCOPY;  Service: Endoscopy;  Laterality: N/A;   KNEE ARTHROSCOPY     KNEE CLOSED REDUCTION  05/07/2015   Procedure: CLOSED MANIPULATION KNEE;  Surgeon: Hessie Knows, MD;  Location: [facility] ORS;  Service: Orthopedics;;   KNEE SURGERY Bilateral 2005,2006   Repair   MOHS SURGERY     nose bcc, left cheeck Dr. Delice Lesch f/u Q  12/2020   REPLACEMENT TOTAL KNEE Left 04/11/2015   manipulation--05/07/2015   ROTATOR CUFF REPAIR W/ DISTAL CLAVICLE EXCISION     SHOULDER SURGERY Right    x2   SKIN CANCER EXCISION     TOTAL KNEE ARTHROPLASTY     VAGINAL HYSTERECTOMY      Social History:  reports that she has never smoked. She has never used smokeless tobacco. She reports that she does not drink alcohol and does not use drugs. Family History: family history includes Breast cancer in her paternal aunt; Cancer in her mother; Heart attack (age of onset: 42) in her sister; Hypertension in her sister.   HOME MEDICATIONS: Allergies as of 02/04/2022       Reactions   Adhesive [tape] Rash   redness        Medication List        Accurate as of February 04, 2022  8:10 AM. If you have any questions, ask your nurse or doctor.          acetaminophen 500 MG tablet Commonly known as: TYLENOL Take 1-2 tablets (500-1,000 mg total) by mouth every 4 (four) hours as needed for fever.   amLODipine 10 MG tablet Commonly known as: NORVASC Take 1 tablet (10 mg total) by mouth daily.  aspirin 81 MG tablet Take 81 mg by mouth daily.   calcium carbonate 600 MG Tabs tablet Commonly known as: OS-CAL Take 600 mg by mouth daily.   carvedilol 3.125 MG tablet Commonly known as: COREG Take 1 tablet (3.125 mg total) by mouth 2 (two) times daily with a meal.   citalopram 10 MG tablet Commonly known as: CeleXA Take 1 tablet (10 mg total) by mouth daily. D/c effexor 37.5   fenofibrate 145 MG tablet Commonly known as: Tricor Take 1 tablet (145 mg total) by mouth daily.   Fish Oil 1200 MG Caps Take 1 capsule by mouth 2 (two) times daily.   gabapentin 100 MG capsule Commonly known as: NEURONTIN Take 1 capsule (100 mg total) by mouth at bedtime.   hydrochlorothiazide 25 MG tablet Commonly known as: HYDRODIURIL Take 1 tablet (25 mg total) by mouth daily.   LORazepam 0.5 MG tablet Commonly known as: ATIVAN Take 0.5-1  tablets (0.25-0.5 mg total) by mouth daily as needed for anxiety.   losartan 100 MG tablet Commonly known as: COZAAR Take 1 tablet (100 mg total) by mouth daily. D/c 50 mg   omeprazole 20 MG capsule Commonly known as: PRILOSEC TAKE 1 CAPSULE BY MOUTH TWICE A DAY   simvastatin 20 MG tablet Commonly known as: ZOCOR TAKE 1 TABLET BY MOUTH EVERY DAY AT 6PM   vitamin B-12 1000 MCG tablet Commonly known as: CYANOCOBALAMIN Take 1,000 mcg by mouth daily.   Vitamin D 50 MCG (2000 UT) Caps Take 1 capsule by mouth daily.          REVIEW OF SYSTEMS: A comprehensive ROS was conducted with the patient     OBJECTIVE:  VS: BP 132/80 (BP Location: Left Arm, Patient Position: Sitting, Cuff Size: Small)    Pulse 68    Ht _0  (1.549 m)    Wt 166 lb (75.3 kg)    SpO2 95%    BMI 31.37 kg/m    Wt Readings from Last 3 Encounters:  02/04/22 166 lb (75.3 kg)  01/23/22 165 lb (74.8 kg)  12/19/21 166 lb 6.4 oz (75.5 kg)     EXAM: General: Pt appears well and is in NAD  Eyes: External eye exam normal without stare, lid lag or exophthalmos.  EOM intact.    Neck: General: Supple without adenopathy. Thyroid: Thyroid size normal.  No goiter or nodules appreciated. No thyroid bruit.  Lungs: Clear with good BS bilat with no rales, rhonchi, or wheezes  Heart: Auscultation: RRR.  Abdomen: Normoactive bowel sounds, soft, nontender, without masses or organomegaly palpable  Extremities:  BL LE: Trace  pretibial edema on the right , no edema on the left   Mental Status: Judgment, insight: Intact Orientation: Oriented to time, place, and person Mood and affect: No depression, anxiety, or agitation     DATA REVIEWED:  Latest Reference Range & Units 01/05/22 09:13  Sodium 134 - 144 mmol/L 129 (L)  Potassium 3.5 - 5.2 mmol/L 4.5  Chloride 96 - 106 mmol/L 92 (L)  CO2 20 - 29 mmol/L 24  Glucose 70 - 99 mg/dL 91  BUN 8 - 27 mg/dL 17  Creatinine 0.57 - 1.00 mg/dL 0.93  Calcium 8.7 - 10.3 mg/dL  9.2  BUN/Creatinine Ratio 12 - 28  18  eGFR >59 mL/min/1.73 64     ASSESSMENT/PLAN/RECOMMENDATIONS:   Hyponatremia :   - Pt is asymptomatic due to chronicity  -Differential diagnosis includes SIADH, SSRI use, HCTZ use, or low effective arterial blood  volume. -I have asked the patient to limit liquid intake, patient does like to drink water, she currently drinks about 4 bottles of water a day, this is in addition to soup, 4 cups of coffee, and occasional juice intake. -I am going to stop HCTZ and recheck in 3 weeks including urine/serum osmolality and urine sodium -I did explain to the patient that with hyponatremia there is a risk of brain swelling which can result in coma .    Medications : Stop HCTZ Reduce water intake from 4 bottles to 2 bottles a day    F/U in 3 weeks   Signed electronically by: Mack Guise, MD  [facility]  [facility] [address] [place], [place] [address] Phone: [phone] FAX: [phone]   CC: Burnard Hawthorne, FNP [address] [place] [address] Phone: [phone] Fax: [phone]   Return to Endocrinology clinic as below: Future Appointments  Date Time Provider [place]  03/23/2022  8:30 AM Burnard Hawthorne, FNP [facility]  04/22/2022  9:15 AM Theora Gianotti, NP [facility]

## 2022-02-08 ENCOUNTER — Other Ambulatory Visit: Payer: Self-pay | Admitting: Adult Health

## 2022-02-12 ENCOUNTER — Telehealth: Payer: Self-pay | Admitting: Family

## 2022-02-12 NOTE — Telephone Encounter (Signed)
Patient called and said her pharmacy needs a prior authorization  on her simvastatin (ZOCOR) 20 MG tablet  ?

## 2022-02-13 ENCOUNTER — Other Ambulatory Visit: Payer: Self-pay

## 2022-02-13 MED ORDER — SIMVASTATIN 20 MG PO TABS: ORAL_TABLET | ORAL | 2 refills | Status: DC

## 2022-02-26 ENCOUNTER — Encounter: Payer: Self-pay | Admitting: Internal Medicine

## 2022-02-26 ENCOUNTER — Ambulatory Visit (INDEPENDENT_AMBULATORY_CARE_PROVIDER_SITE_OTHER): Payer: Medicare Other | Admitting: Internal Medicine

## 2022-02-26 ENCOUNTER — Other Ambulatory Visit: Payer: Self-pay

## 2022-02-26 VITALS — BP 140/80 | HR 61 | Ht 61.0 in | Wt 166.6 lb

## 2022-02-26 DIAGNOSIS — E871 Hypo-osmolality and hyponatremia: Secondary | ICD-10-CM | POA: Diagnosis not present

## 2022-02-26 LAB — BASIC METABOLIC PANEL
BUN: 19 mg/dL (ref 6–23)
CO2: 26 mEq/L (ref 19–32)
Calcium: 9.5 mg/dL (ref 8.4–10.5)
Chloride: 99 mEq/L (ref 96–112)
Creatinine, Ser: 1.14 mg/dL (ref 0.40–1.20)
GFR: 46.89 mL/min — ABNORMAL LOW (ref >60.00)
Glucose, Bld: 85 mg/dL (ref 70–99)
Potassium: 4.4 mEq/L (ref 3.5–5.1)
Sodium: 132 mEq/L — ABNORMAL LOW (ref 135–145)

## 2022-02-26 LAB — TSH: TSH: 3.18 u[IU]/mL (ref 0.35–5.50)

## 2022-02-26 LAB — T4, FREE: Free T4: 0.81 ng/dL (ref 0.60–1.60)

## 2022-02-26 NOTE — Progress Notes (Signed)
? ? ?Name: Kristen Powell  ?MRN/ DOB: [id number], 1946-03-02    ?Age/ Sex: 76 y.o., female   ? ?PCP: Burnard Hawthorne, FNP   ?Reason for Endocrinology Evaluation: Hyponatremia   ?   ?Date of Initial Endocrinology Evaluation: 02/04/2022  ? ? ?HPI: ?Kristen Powell is a 76 y.o. female with a past medical history of HTN, GERD and DJD. The patient presented for initial endocrinology clinic visit on 02/04/2022 for consultative assistance with her Hyponatremia .  ? ?Pt has been noted with intermittent hyponatremia since 2016 with a nadir of 128 mmol/L in 2016.  ? ? ?The pt is on HCTZ for HTN for 6 weeks due to LE edema per pt. Echo showed MR, EF 60-65%  ?She drinks 4 bottles of water a day plus 3-4 cups of coffee, occasional juice , on her initial visit she was advised to reduce her liquid intake by 50% and avoid juice, we also stopped her HCTZ with serum sodium of 129 mmol/L ? ?No prior hx of tobacco use  ? ? ?SUBJECTIVE:  ? ? ?Today (02/26/22):  Kristen Powell is here for a follow up on hyponatremia.  She has stopped drinking all juices, she has also reduce the amount of soups, water and has reduced coffee intake to 1 cup a day. ? ? ?She stopped HCTZ without any worsening of lower extremity edema no weight gain ? ?Denies dizziness  ?Denies nausea  ?Denies headaches  ?Has right foot pain, but no LE swelling  ?She has been sleeping better at night ? ? ? ? ?HISTORY:  ?Past Medical History:  ?Past Medical History:  ?Diagnosis Date  ? Anxiety   ? COVID-19   ? Hyperlipidemia   ? Hypertension   ? Psoriasis   ? younger age  ? Spinal stenosis   ? Traumatic rupture of left ear drum   ? ?Past Surgical History:  ?Past Surgical History:  ?Procedure Laterality Date  ? ABDOMINAL HYSTERECTOMY  12/14/1978  ? Partial  ? BASAL CELL CARCINOMA EXCISION  2006,2008,2011  ? BUNIONECTOMY    ? BUNIONECTOMY Bilateral 12/14/1990  ? COLONOSCOPY WITH PROPOFOL N/A 12/09/2021  ? Procedure: COLONOSCOPY WITH PROPOFOL;  Surgeon: Lucilla Lame, MD;   Location: [facility] ENDOSCOPY;  Service: Endoscopy;  Laterality: N/A;  ? KNEE ARTHROSCOPY    ? KNEE CLOSED REDUCTION  05/07/2015  ? Procedure: CLOSED MANIPULATION KNEE;  Surgeon: Hessie Knows, MD;  Location: [facility] ORS;  Service: Orthopedics;;  ? KNEE SURGERY Bilateral 2005,2006  ? Repair  ? MOHS SURGERY    ? nose bcc, left cheeck Dr. Delice Lesch f/u Q 12/2020  ? REPLACEMENT TOTAL KNEE Left 04/11/2015  ? manipulation--05/07/2015  ? ROTATOR CUFF REPAIR W/ DISTAL CLAVICLE EXCISION    ? SHOULDER SURGERY Right   ? x2  ? SKIN CANCER EXCISION    ? TOTAL KNEE ARTHROPLASTY    ? VAGINAL HYSTERECTOMY    ?  ?Social History:  reports that she has never smoked. She has never used smokeless tobacco. She reports that she does not drink alcohol and does not use drugs. ?Family History: family history includes Breast cancer in her paternal aunt; Cancer in her mother; Heart attack (age of onset: 94) in her sister; Hypertension in her sister. ? ? ?HOME MEDICATIONS: ?Allergies as of 02/26/2022   ? ?   Reactions  ? Adhesive [tape] Rash  ? redness  ? ?  ? ?  ?Medication List  ?  ? ?  ? Accurate as of February 26, 2022  9:44 AM. If you have any questions, ask your nurse or doctor.  ?  ?  ? ?  ? ?acetaminophen 500 MG tablet ?Commonly known as: TYLENOL ?Take 1-2 tablets (500-1,000 mg total) by mouth every 4 (four) hours as needed for fever. ?  ?amLODipine 10 MG tablet ?Commonly known as: NORVASC ?Take 1 tablet (10 mg total) by mouth daily. ?  ?aspirin 81 MG tablet ?Take 81 mg by mouth daily. ?  ?calcium carbonate 600 MG Tabs tablet ?Commonly known as: OS-CAL ?Take 600 mg by mouth daily. ?  ?carvedilol 3.125 MG tablet ?Commonly known as: COREG ?Take 1 tablet (3.125 mg total) by mouth 2 (two) times daily with a meal. ?  ?citalopram 10 MG tablet ?Commonly known as: CeleXA ?Take 1 tablet (10 mg total) by mouth daily. D/c effexor 37.5 ?  ?fenofibrate 145 MG tablet ?Commonly known as: Tricor ?Take 1 tablet (145 mg total) by mouth daily. ?  ?Fish Oil 1200 MG  Caps ?Take 1 capsule by mouth 2 (two) times daily. ?  ?gabapentin 100 MG capsule ?Commonly known as: NEURONTIN ?Take 1 capsule (100 mg total) by mouth at bedtime. ?  ?hydrochlorothiazide 25 MG tablet ?Commonly known as: HYDRODIURIL ?Take 1 tablet (25 mg total) by mouth daily. ?  ?LORazepam 0.5 MG tablet ?Commonly known as: ATIVAN ?Take 0.5-1 tablets (0.25-0.5 mg total) by mouth daily as needed for anxiety. ?  ?losartan 100 MG tablet ?Commonly known as: COZAAR ?Take 1 tablet (100 mg total) by mouth daily. D/c 50 mg ?  ?omeprazole 20 MG capsule ?Commonly known as: PRILOSEC ?TAKE 1 CAPSULE BY MOUTH TWICE A DAY ?  ?simvastatin 20 MG tablet ?Commonly known as: ZOCOR ?TAKE 1 TABLET BY MOUTH EVERY DAY AT 6PM ?  ?vitamin B-12 1000 MCG tablet ?Commonly known as: CYANOCOBALAMIN ?Take 1,000 mcg by mouth daily. ?  ?Vitamin D 50 MCG (2000 UT) Caps ?Take 1 capsule by mouth daily. ?  ? ?  ?  ? ? ?REVIEW OF SYSTEMS: ?A comprehensive ROS was conducted with the patient  ? ? ? ?OBJECTIVE:  ?VS: BP 140/80 (BP Location: Left Arm, Patient Position: Sitting, Cuff Size: Large)   Pulse 61   Ht '5\' 1"'$  (1.549 m)   Wt 166 lb 9.6 oz (75.6 kg)   SpO2 99%   BMI 31.48 kg/m?   ? ?Wt Readings from Last 3 Encounters:  ?02/26/22 166 lb 9.6 oz (75.6 kg)  ?02/04/22 166 lb (75.3 kg)  ?01/23/22 165 lb (74.8 kg)  ? ? ? ?EXAM: ?General: Pt appears well and is in NAD  ?Eyes: External eye exam normal without stare, lid lag or exophthalmos.  EOM intact.    ?Neck: General: Supple without adenopathy. ?Thyroid: Thyroid size normal.  No goiter or nodules appreciated. No thyroid bruit.  ?Lungs: Clear with good BS bilat with no rales, rhonchi, or wheezes  ?Heart: Auscultation: RRR.  ?Extremities:  ?BL LE: Trace  pretibial edema on the right , no edema on the left   ?Mental Status: Judgment, insight: Intact ?Orientation: Oriented to time, place, and person ?Mood and affect: No depression, anxiety, or agitation  ? ? ? ?DATA REVIEWED: ? Latest Reference Range &  Units 02/26/22 09:57  ?Sodium 135 - 145 mEq/L 132 (L)  ?Potassium 3.5 - 5.1 mEq/L 4.4  ?Chloride 96 - 112 mEq/L 99  ?CO2 19 - 32 mEq/L 26  ?Glucose 70 - 99 mg/dL 85  ?BUN 6 - 23 mg/dL 19  ?Creatinine 0.40 - 1.20 mg/dL 1.14  ?  Calcium 8.4 - 10.5 mg/dL 9.5  ?GFR >60.00 mL/min 46.89 (L)  ? ? Latest Reference Range & Units 02/26/22 09:57  ?Glucose 70 - 99 mg/dL 85  ?TSH 0.35 - 5.50 uIU/mL 3.18  ?T4,Free(Direct) 0.60 - 1.60 ng/dL 0.81  ? ? Latest Reference Range & Units 02/26/22 09:57  ?Sodium, Urine 28 - 272 mmol/L 92  ? ? ? ?ASSESSMENT/PLAN/RECOMMENDATIONS:  ? ?Hyponatremia  ? ? 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  Serum and urine osmolality pending ?-She has done well with reducing liquid intake ? ?Medications : ? ?Reduce water intake to 2 bottles a day, as well as reducing coffee/tea/juice/soups ? ? ? ?F/U in 4 months  ? ?Signed electronically by: ?Abby Nena Jordan, MD ? ?[facility]  ? [facility] ?[address] ?[place], [place] [address] ?Phone: [phone] ?FAX: [phone] ? ? ?CC: ?Burnard Hawthorne, FNP ?[phone] [address] ?[place] [address] [place] [address] ?Phone: [phone] ?Fax: [phone] ? ? ?Return to Endocrinology clinic as below: ?Future Appointments  ?Date Time Provider [place]  ?03/19/2022  8:30 AM Burnard Hawthorne, FNP [facility]  ?04/22/2022  9:15 AM Theora Gianotti, NP [facility]  ?  ? ? ? ? ? ?

## 2022-02-26 NOTE — Patient Instructions (Signed)
?-   Limit water intake from 4 bottles a day  to 2-3 bottles a day ( Preferably 2 bottles)  ?

## 2022-02-28 LAB — OSMOLALITY, URINE: Osmolality, Ur: 346 mOsm/kg (ref 50–1200)

## 2022-02-28 LAB — OSMOLALITY: Osmolality: 287 mOsm/kg (ref 278–305)

## 2022-02-28 LAB — SODIUM, URINE, RANDOM: Sodium, Ur: 92 mmol/L (ref 28–272)

## 2022-03-06 ENCOUNTER — Other Ambulatory Visit: Payer: Self-pay | Admitting: Adult Health

## 2022-03-06 DIAGNOSIS — K219 Gastro-esophageal reflux disease without esophagitis: Secondary | ICD-10-CM

## 2022-03-13 ENCOUNTER — Telehealth: Payer: Self-pay | Admitting: Family

## 2022-03-13 NOTE — Telephone Encounter (Signed)
Copied from [place] [id number]. Topic: Medicare AWV ?>> Mar 13, 2022 11:11 AM Harris-Coley, Hannah Beat wrote: ?Reason for CRM: Left message for patient to schedule Annual Wellness Visit.  Please schedule with Nurse Health Advisor Denisa O'Brien-Blaney, LPN at [facility].  Please call [phone] ask for Juliann Pulse ?

## 2022-03-19 ENCOUNTER — Encounter: Payer: Self-pay | Admitting: Family

## 2022-03-19 ENCOUNTER — Other Ambulatory Visit: Payer: Self-pay

## 2022-03-19 ENCOUNTER — Ambulatory Visit (INDEPENDENT_AMBULATORY_CARE_PROVIDER_SITE_OTHER): Payer: Medicare Other | Admitting: Family

## 2022-03-19 DIAGNOSIS — F419 Anxiety disorder, unspecified: Secondary | ICD-10-CM

## 2022-03-19 DIAGNOSIS — E782 Mixed hyperlipidemia: Secondary | ICD-10-CM

## 2022-03-19 DIAGNOSIS — I10 Essential (primary) hypertension: Secondary | ICD-10-CM | POA: Diagnosis not present

## 2022-03-19 DIAGNOSIS — F411 Generalized anxiety disorder: Secondary | ICD-10-CM

## 2022-03-19 DIAGNOSIS — E871 Hypo-osmolality and hyponatremia: Secondary | ICD-10-CM | POA: Diagnosis not present

## 2022-03-19 MED ORDER — LORAZEPAM 0.5 MG PO TABS: 0.2500 mg | ORAL_TABLET | Freq: Every day | ORAL | 2 refills | Status: DC | PRN

## 2022-03-19 NOTE — Progress Notes (Signed)
? ?Subjective:  ? ? Patient ID: Kristen Powell, female    DOB: 06-24-1946, 76 y.o.   MRN: [id number] ? ?CC: Kristen Powell is a 76 y.o. female who presents today for follow up.  ? ?HPI: GAD- compliant celexa '10mg'$ . Takes xanax 0.'5mg'$  rarely half tablet.  Request refill today.  She feels very happy and is recently started dating.  She excited for an upcoming trip with her great-grandchildren ? ?Rides exercise bike at home, 3-4 x week.  ?She does moderate house work and yard work as well.  ? ?No cp, sob ? ? ?She had follow-up with endocrine 02/26/2022 for hyponatremia, suspected SIADH.  She is no longer on HCTZ.  She is reducing liquid intake ?She had follow-up with Dr. Charlestine Night, cardiology 01/23/2022 for hypertension, hyperlipidemia, systolic murmur.  Continued on Coreg 3.125 mg twice daily, Norvasc 10 mg qam, losartan 100 mg qam. She hasnt taken coreg this morning ? ?3 weeks ago sodium 132 previously 129. ? ? ? ?HLD- compliant with zocor '20mg'$ . Fenofibrate '145mg'$  ? ? ?HISTORY:  ?Past Medical History:  ?Diagnosis Date  ? Anxiety   ? COVID-19   ? Hyperlipidemia   ? Hypertension   ? Psoriasis   ? younger age  ? Spinal stenosis   ? Traumatic rupture of left ear drum   ? ?Past Surgical History:  ?Procedure Laterality Date  ? ABDOMINAL HYSTERECTOMY  12/14/1978  ? Partial  ? BASAL CELL CARCINOMA EXCISION  2006,2008,2011  ? BUNIONECTOMY    ? BUNIONECTOMY Bilateral 12/14/1990  ? COLONOSCOPY WITH PROPOFOL N/A 12/09/2021  ? Procedure: COLONOSCOPY WITH PROPOFOL;  Surgeon: Lucilla Lame, MD;  Location: [facility] ENDOSCOPY;  Service: Endoscopy;  Laterality: N/A;  ? KNEE ARTHROSCOPY    ? KNEE CLOSED REDUCTION  05/07/2015  ? Procedure: CLOSED MANIPULATION KNEE;  Surgeon: Hessie Knows, MD;  Location: [facility] ORS;  Service: Orthopedics;;  ? KNEE SURGERY Bilateral 2005,2006  ? Repair  ? MOHS SURGERY    ? nose bcc, left cheeck Dr. Delice Lesch f/u Q 12/2020  ? REPLACEMENT TOTAL KNEE Left 04/11/2015  ? manipulation--05/07/2015  ? ROTATOR CUFF REPAIR W/  DISTAL CLAVICLE EXCISION    ? SHOULDER SURGERY Right   ? x2  ? SKIN CANCER EXCISION    ? TOTAL KNEE ARTHROPLASTY    ? VAGINAL HYSTERECTOMY    ? ?Family History  ?Problem Relation Age of Onset  ? Cancer Mother   ?     lung  ? Hypertension Sister   ? Heart attack Sister 57  ? Breast cancer Paternal Aunt   ? ? ?Allergies: Adhesive [tape] ?Current Outpatient Medications on File Prior to Visit  ?Medication Sig Dispense Refill  ? acetaminophen (TYLENOL) 500 MG tablet Take 1-2 tablets (500-1,000 mg total) by mouth every 4 (four) hours as needed for fever. 30 tablet 0  ? amLODipine (NORVASC) 10 MG tablet Take 1 tablet (10 mg total) by mouth daily. 90 tablet 3  ? aspirin 81 MG tablet Take 81 mg by mouth daily.    ? calcium carbonate (OS-CAL) 600 MG TABS tablet Take 600 mg by mouth daily.    ? carvedilol (COREG) 3.125 MG tablet Take 1 tablet (3.125 mg total) by mouth 2 (two) times daily with a meal. 120 tablet 3  ? Cholecalciferol (VITAMIN D) 2000 UNITS CAPS Take 1 capsule by mouth daily.    ? citalopram (CELEXA) 10 MG tablet Take 1 tablet (10 mg total) by mouth daily. D/c effexor 37.5 90 tablet 3  ? fenofibrate (TRICOR)  145 MG tablet Take 1 tablet (145 mg total) by mouth daily. 30 tablet 5  ? gabapentin (NEURONTIN) 100 MG capsule Take 1 capsule (100 mg total) by mouth at bedtime. 90 capsule 3  ? losartan (COZAAR) 100 MG tablet Take 1 tablet (100 mg total) by mouth daily. D/c 50 mg 90 tablet 3  ? Omega-3 Fatty Acids (FISH OIL) 1200 MG CAPS Take 1 capsule by mouth 2 (two) times daily.    ? omeprazole (PRILOSEC) 20 MG capsule TAKE 1 CAPSULE BY MOUTH TWICE A DAY 180 capsule 0  ? simvastatin (ZOCOR) 20 MG tablet TAKE 1 TABLET BY MOUTH EVERY DAY AT 6PM 90 tablet 1  ? vitamin B-12 (CYANOCOBALAMIN) 1000 MCG tablet Take 1,000 mcg by mouth daily.    ? ?Current Facility-Administered Medications on File Prior to Visit  ?Medication Dose Route Frequency Provider Last Rate Last Admin  ? betamethasone acetate-betamethasone sodium phosphate  (CELESTONE) injection 3 mg  3 mg Intra-articular Once Edrick Kins, DPM      ? ? ?Social History  ? ?Tobacco Use  ? Smoking status: Never  ? Smokeless tobacco: Never  ?Vaping Use  ? Vaping Use: Never used  ?Substance Use Topics  ? Alcohol use: No  ? Drug use: No  ? ? ?Review of Systems  ?Constitutional:  Negative for chills and fever.  ?Respiratory:  Negative for cough.   ?Cardiovascular:  Negative for chest pain and palpitations.  ?Gastrointestinal:  Negative for nausea and vomiting.  ?Psychiatric/Behavioral:  The patient is not nervous/anxious (controlled).   ?   ?Objective:  ?  ?BP 138/80 (BP Location: Left Arm, Patient Position: Sitting, Cuff Size: Large)   Pulse 73   Temp 98 ?F (36.7 ?C) (Oral)   Ht '5\' 1"'$  (1.549 m)   Wt 164 lb 9.6 oz (74.7 kg)   SpO2 98%   BMI 31.10 kg/m?  ?BP Readings from Last 3 Encounters:  ?03/19/22 138/80  ?02/26/22 140/80  ?02/04/22 132/80  ? ?Wt Readings from Last 3 Encounters:  ?03/19/22 164 lb 9.6 oz (74.7 kg)  ?02/26/22 166 lb 9.6 oz (75.6 kg)  ?02/04/22 166 lb (75.3 kg)  ? ? ?Physical Exam ?Vitals reviewed.  ?Constitutional:   ?   Appearance: She is well-developed.  ?Eyes:  ?   Conjunctiva/sclera: Conjunctivae normal.  ?Cardiovascular:  ?   Rate and Rhythm: Normal rate and regular rhythm.  ?   Pulses: Normal pulses.  ?   Heart sounds: Normal heart sounds.  ?Pulmonary:  ?   Effort: Pulmonary effort is normal.  ?   Breath sounds: Normal breath sounds. No wheezing, rhonchi or rales.  ?Skin: ?   General: Skin is warm and dry.  ?Neurological:  ?   Mental Status: She is alert.  ?Psychiatric:     ?   Speech: Speech normal.     ?   Behavior: Behavior normal.     ?   Thought Content: Thought content normal.  ? ? ?   ?Assessment & Plan:  ? ?Problem List Items Addressed This Visit   ? ?  ? Cardiovascular and Mediastinum  ? Essential hypertension  ?  Slight elevated today.  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  She has yet to take Coreg 3.'125mg'$   this morning.  She  has taken Norvasc 10 mg, losartan 100 mg about an hour ago.  Advised her to spot check blood pressure at home as a reasonable goal for her would be 130/80.  If she is able to obtain this, I would not feel inclined to resume hydrochlorothiazide due to hyponatremia.  She has had follow-up with cardiology, Ignacia Bayley next month.  Will appreciate his input as well ?  ?  ?  ? Other  ? Anxiety  ? Relevant Medications  ? LORazepam (ATIVAN) 0.5 MG tablet  ? Generalized anxiety disorder  ?  Chronic, stable.  Continue Celexa 10 mg, rare use of Xanax 0.5 mg.  I have refilled today ?I looked up patient on  Controlled Substances Reporting System PMP AWARE and saw no activity that raised concern of inappropriate use.  ? ?  ?  ? Relevant Medications  ? LORazepam (ATIVAN) 0.5 MG tablet  ? HLD (hyperlipidemia)  ?  Chronic, stable.  Elevated triglycerides.  She has been on fenofibrate 145 mg for 2 months.  Advised we can recheck cholesterol at follow-up to ensure improved  Continue Zocor 20 mg ?  ?  ? Hyponatremia  ?  Improved.  Reviewed Dr Quin Hoop note.  ?3 weeks ago sodium 132 previously 129. ?She  will remain off of HCTZ 25 mg for now ?  ?  ? ? ? ?I have discontinued Ruqaya A. Lemmons's hydrochlorothiazide. I am also having her maintain her aspirin, Vitamin D, calcium carbonate, vitamin B-12, Fish Oil, acetaminophen, losartan, citalopram, gabapentin, carvedilol, fenofibrate, amLODipine, simvastatin, omeprazole, and LORazepam. We will continue to administer betamethasone acetate-betamethasone sodium phosphate. ? ? ?Meds ordered this encounter  ?Medications  ? LORazepam (ATIVAN) 0.5 MG tablet  ?  Sig: Take 0.5-1 tablets (0.25-0.5 mg total) by mouth daily as needed for anxiety.  ?  Dispense:  30 tablet  ?  Refill:  2  ?  Order Specific Question:   Supervising Provider  ?  Answer:   Crecencio Mc [[id number]]  ? ? ?Return precautions given.  ? ?Risks, benefits, and alternatives of the medications and treatment plan prescribed  today were discussed, and patient expressed understanding.  ? ?Education regarding symptom management and diagnosis given to patient on AVS. ? ?Continue to follow with Burnard Hawthorne, FNP for routine health [place]

## 2022-03-19 NOTE — Patient Instructions (Addendum)
It is imperative that you are seen AT least twice per year for labs and monitoring. Monitor blood pressure at home and me 5-6 reading on separate days. Goal is less than 130/80,  if persistently higher, please make sooner follow up appointment so we can recheck you blood pressure and manage/ adjust medications. ? ?Managing Your Hypertension ?Hypertension, also called high blood pressure, is when the force of the blood pressing against the walls of the arteries is too strong. Arteries are blood vessels that carry blood from your heart throughout your body. Hypertension forces the heart to work harder to pump blood and may cause the arteries to become narrow or stiff. ?Understanding blood pressure readings ?Your personal target blood pressure may vary depending on your medical conditions, your age, and other factors. A blood pressure reading includes a higher number over a lower number. Ideally, your blood pressure should be below 120/80. You should know that: ?The first, or top, number is called the systolic pressure. It is a measure of the pressure in your arteries as your heart beats. ?The second, or bottom number, is called the diastolic pressure. It is a measure of the pressure in your arteries as the heart relaxes. ?Blood pressure is classified into four stages. Based on your blood pressure reading, your health care provider may use the following stages to determine what type of treatment you need, if any. Systolic pressure and diastolic pressure are measured in a unit called mmHg. ?Normal ?Systolic pressure: below 242. ?Diastolic pressure: below 80. ?Elevated ?Systolic pressure: 683-419. ?Diastolic pressure: below 80. ?Hypertension stage 1 ?Systolic pressure: 622-297. ?Diastolic pressure: 98-92. ?Hypertension stage 2 ?Systolic pressure: 119 or above. ?Diastolic pressure: 90 or above. ?How can this condition affect me? ?Managing your hypertension is an important responsibility. Over time, hypertension can damage  the arteries and decrease blood flow to important parts of the body, including the brain, heart, and kidneys. Having untreated or uncontrolled hypertension can lead to: ?A heart attack. ?A stroke. ?A weakened blood vessel (aneurysm). ?Heart failure. ?Kidney damage. ?Eye damage. ?Metabolic syndrome. ?Memory and concentration problems. ?Vascular dementia. ?What actions can I take to manage this condition? ?Hypertension can be managed by making lifestyle changes and possibly by taking medicines. Your health care provider will help you make a plan to bring your blood pressure within a normal range. ?Nutrition ? ?Eat a diet that is high in fiber and potassium, and low in salt (sodium), added sugar, and fat. An example eating plan is called the Dietary Approaches to Stop Hypertension (DASH) diet. To eat this way: ?Eat plenty of fresh fruits and vegetables. Try to fill one-half of your plate at each meal with fruits and vegetables. ?Eat whole grains, such as whole-wheat pasta, brown rice, or whole-grain bread. Fill about one-fourth of your plate with whole grains. ?Eat low-fat dairy products. ?Avoid fatty cuts of meat, processed or cured meats, and poultry with skin. Fill about one-fourth of your plate with lean proteins such as fish, chicken without skin, beans, eggs, and tofu. ?Avoid pre-made and processed foods. These tend to be higher in sodium, added sugar, and fat. ?Reduce your daily sodium intake. Most people with hypertension should eat less than 1,500 mg of sodium a day. ?Lifestyle ? ?Work with your health care provider to maintain a healthy body weight or to lose weight. Ask what an ideal weight is for you. ?Get at least 30 minutes of exercise that causes your heart to beat faster (aerobic exercise) most days of the week. Activities  may include walking, swimming, or biking. ?Include exercise to strengthen your muscles (resistance exercise), such as weight lifting, as part of your weekly exercise routine. Try to  do these types of exercises for 30 minutes at least 3 days a week. ?Do not use any products that contain nicotine or tobacco, such as cigarettes, e-cigarettes, and chewing tobacco. If you need help quitting, ask your health care provider. ?Control any long-term (chronic) conditions you have, such as high cholesterol or diabetes. ?Identify your sources of stress and find ways to manage stress. This may include meditation, deep breathing, or making time for fun activities. ?Alcohol use ?Do not drink alcohol if: ?Your health care provider tells you not to drink. ?You are pregnant, may be pregnant, or are planning to become pregnant. ?If you drink alcohol: ?Limit how much you use to: ?0-1 drink a day for women. ?0-2 drinks a day for men. ?Be aware of how much alcohol is in your drink. In the [place] one drink equals one 12 oz bottle of beer (355 mL), one 5 oz glass of wine (148 mL), or one 1? oz glass of hard liquor (44 mL). ?Medicines ?Your health care provider may prescribe medicine if lifestyle changes are not enough to get your blood pressure under control and if: ?Your systolic blood pressure is 130 or higher. ?Your diastolic blood pressure is 80 or higher. ?Take medicines only as told by your health care provider. Follow the directions carefully. Blood pressure medicines must be taken as told by your health care provider. The medicine does not work as well when you skip doses. Skipping doses also puts you at risk for problems. ?Monitoring ?Before you monitor your blood pressure: ?Do not smoke, drink caffeinated beverages, or exercise within 30 minutes before taking a measurement. ?Use the bathroom and empty your bladder (urinate). ?Sit quietly for at least 5 minutes before taking measurements. ?Monitor your blood pressure at home as told by your health care provider. To do this: ?Sit with your back straight and supported. ?Place your feet flat on the floor. Do not cross your legs. ?Support your arm on a flat  surface, such as a table. Make sure your upper arm is at heart level. ?Each time you measure, take two or three readings one minute apart and record the results. ?You may also need to have your blood pressure checked regularly by your health care provider. ?General information ?Talk with your health care provider about your diet, exercise habits, and other lifestyle factors that may be contributing to hypertension. ?Review all the medicines you take with your health care provider because there may be side effects or interactions. ?Keep all visits as told by your health care provider. Your health care provider can help you create and adjust your plan for managing your high blood pressure. ?Where to find more information ?National Heart, Lung, and Blood Institute: https://wilson-eaton.com/ ?American Heart Association: www.heart.org ?Contact a health care provider if: ?You think you are having a reaction to medicines you have taken. ?You have repeated (recurrent) headaches. ?You feel dizzy. ?You have swelling in your ankles. ?You have trouble with your vision. ?Get help right away if: ?You develop a severe headache or confusion. ?You have unusual weakness or numbness, or you feel faint. ?You have severe pain in your chest or abdomen. ?You vomit repeatedly. ?You have trouble breathing. ?These symptoms may represent a serious problem that is an emergency. Do not wait to see if the symptoms will go away. Get medical help right away.  Call your local emergency services (911 in the [place] Do not drive yourself to the hospital. ?Summary ?Hypertension is when the force of blood pumping through your arteries is too strong. If this condition is not controlled, it may put you at risk for serious complications. ?Your personal target blood pressure may vary depending on your medical conditions, your age, and other factors. For most people, a normal blood pressure is less than 120/80. ?Hypertension is managed by lifestyle changes,  medicines, or both. ?Lifestyle changes to help manage hypertension include losing weight, eating a healthy, low-sodium diet, exercising more, stopping smoking, and limiting alcohol. ?This information is not intended

## 2022-03-19 NOTE — Assessment & Plan Note (Signed)
Chronic, stable.  Elevated triglycerides.  She has been on fenofibrate 145 mg for 2 months.  Advised we can recheck cholesterol at follow-up to ensure improved  Continue Zocor 20 mg ?

## 2022-03-19 NOTE — Assessment & Plan Note (Signed)
Improved.  Reviewed Dr Quin Hoop note.  ?3 weeks ago sodium 132 previously 129. ?She  will remain off of HCTZ 25 mg for now ?

## 2022-03-19 NOTE — Assessment & Plan Note (Signed)
Chronic, stable.  Continue Celexa 10 mg, rare use of Xanax 0.5 mg.  I have refilled today ?I looked up patient on [place] Controlled Substances Reporting System PMP AWARE and saw no activity that raised concern of inappropriate use.  ? ?

## 2022-03-19 NOTE — Assessment & Plan Note (Signed)
Slight elevated today.  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  She has yet to take Coreg 3.'125mg'$   this morning.  She has taken Norvasc 10 mg, losartan 100 mg about an hour ago.  Advised her to spot check blood pressure at home as a reasonable goal for her would be 130/80.  If she is able to obtain this, I would not feel inclined to resume hydrochlorothiazide due to hyponatremia.  She has had follow-up with cardiology, Kristen Powell next month.  Will appreciate his input as well ?

## 2022-03-23 ENCOUNTER — Ambulatory Visit: Payer: Medicare Other | Admitting: Family

## 2022-04-14 ENCOUNTER — Telehealth: Payer: Self-pay

## 2022-04-14 DIAGNOSIS — E782 Mixed hyperlipidemia: Secondary | ICD-10-CM

## 2022-04-14 NOTE — Telephone Encounter (Signed)
Called patient and left a VM requesting a call back. Patient needs to get a Fasting Lipid drawn prior to her follow up with Ignacia Bayley on 04/22/22. ?

## 2022-04-16 NOTE — Telephone Encounter (Signed)
Spoke with patient and reminded her to get lipid panel drawn as soon as possible so we could have the results to go over with her at her appointment next week. Patient expressed gratitude for the reminder. ?

## 2022-04-17 ENCOUNTER — Other Ambulatory Visit
Admission: RE | Admit: 2022-04-17 | Discharge: 2022-04-17 | Disposition: A | Discharge status: Home / Self Care | Payer: Medicare Other | Attending: Cardiology | Admitting: Cardiology

## 2022-04-17 DIAGNOSIS — E782 Mixed hyperlipidemia: Secondary | ICD-10-CM | POA: Diagnosis not present

## 2022-04-17 LAB — LIPID PANEL
Cholesterol: 169 mg/dL (ref 0–200)
HDL: 55 mg/dL (ref >40)
LDL Cholesterol: 91 mg/dL (ref 0–99)
Total CHOL/HDL Ratio: 3.1 RATIO
Triglycerides: 114 mg/dL (ref <150)
VLDL: 23 mg/dL (ref 0–40)

## 2022-04-20 ENCOUNTER — Other Ambulatory Visit: Payer: Self-pay | Admitting: Family

## 2022-04-20 DIAGNOSIS — I10 Essential (primary) hypertension: Secondary | ICD-10-CM

## 2022-04-20 NOTE — Progress Notes (Signed)
? ? ?Office Visit  ?  ?Patient Name: Kristen Powell ?Date of Encounter: 04/20/2022 ? ?Primary Care Provider:  Burnard Hawthorne, FNP ?Primary Cardiologist:  Kate Sable, MD ?Primary Electrophysiologist: None ? ?Patient Profile  ?  ?Chief Complaint: 67-monthfollow-up for hypertension ? ? Notable History: ?HTN ?HLD ?Anxiety ? Recent Studies: ?07/2021 renal artery U/S: No evidence of stenosis ?2D echo 12/2021: EF 60-65%, normal LV function with no RWMA and grade 1 DD.  Mild elevated PA systolic pressure, no evidence of mitral stenosis, or aortic valve regurgitation, mild mitral valve regurgitation. ? ?History of Present Illness  ?  ?Kristen Rockholdis a 76y.o. female with PMH of HTN, HLD, anxiety.  She was seen for consultation by Dr. AGaren Lahfor management of uncontrolled hypertension.  She underwent renal artery ultrasounds that revealed no evidence of stenosis.  She was started on HCTZ 25 mg, and hydralazine was stopped.  2D echo completed to evaluate systolic murmur that revealed EF 60-65%, with normal LV function and mild mitral valve regurgitation. ? ?[name] was seen on 01/2022 for follow-up of hypertension and to review echo findings.  During appointment patient endorsed much improved blood pressure readings with average 120s to 130s at home.  Lipid panel completed with elevated triglycerides and patient was started on fenofibrate 145 mg.  ? ?Since last being seen in the office patient reports doing well and is tolerating her medications without any adverse effects. She did report that endocrinology stopped her HCTZ due to hyponatremia in the setting of hyponatremia. Patient denies chest pain, palpitations, dyspnea, PND, orthopnea, nausea, vomiting, dizziness, syncope, edema, weight gain, or early satiety. 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 Her B/P today was 138/60 and she reports that she had one reading of 169/60 that occurred once  at home that improved following her medications. ? ?Past Medical History  ?  ?Past Medical History:  ?Diagnosis Date  ? Anxiety   ? COVID-19   ? Hyperlipidemia   ? Hypertension   ? Psoriasis   ? younger age  ? Spinal stenosis   ? Traumatic rupture of left ear drum   ? ?Past Surgical History:  ?Procedure Laterality Date  ? ABDOMINAL HYSTERECTOMY  12/14/1978  ? Partial  ? BASAL CELL CARCINOMA EXCISION  2006,2008,2011  ? BUNIONECTOMY    ? BUNIONECTOMY Bilateral 12/14/1990  ? COLONOSCOPY WITH PROPOFOL N/A 12/09/2021  ? Procedure: COLONOSCOPY WITH PROPOFOL;  Surgeon: WLucilla Lame MD;  [facility];  Service: Endoscopy;  Laterality: N/A;  ? KNEE ARTHROSCOPY    ? KNEE CLOSED REDUCTION  05/07/2015  ? Procedure: CLOSED MANIPULATION KNEE;  Surgeon: MHessie Knows MD;  Location: [facility] ORS;  Service: Orthopedics;;  ? KNEE SURGERY Bilateral 2005,2006  ? Repair  ? MOHS SURGERY    ? nose bcc, left cheeck Dr. SDelice Leschf/u Q 12/2020  ? REPLACEMENT TOTAL KNEE Left 04/11/2015  ? manipulation--05/07/2015  ? ROTATOR CUFF REPAIR W/ DISTAL CLAVICLE EXCISION    ? SHOULDER SURGERY Right   ? x2  ? SKIN CANCER EXCISION    ? TOTAL KNEE ARTHROPLASTY    ? VAGINAL HYSTERECTOMY    ? ? ?Allergies ? ?Allergies  ?Allergen Reactions  ? Adhesive [Tape] Rash  ?  redness  ? ? ?Home Medications  ?  ?Current Outpatient Medications  ?Medication Sig Dispense Refill  ? acetaminophen (TYLENOL) 500 MG tablet Take 1-2 tablets (500-1,000 mg total) by mouth every 4 (  four) hours as needed for fever. 30 tablet 0  ? amLODipine (NORVASC) 10 MG tablet Take 1 tablet (10 mg total) by mouth daily. 90 tablet 3  ? aspirin 81 MG tablet Take 81 mg by mouth daily.    ? calcium carbonate (OS-CAL) 600 MG TABS tablet Take 600 mg by mouth daily.    ? carvedilol (COREG) 3.125 MG tablet TAKE 1 TABLET (3.125 MG TOTAL) BY MOUTH 2 (TWO) TIMES DAILY WITH A MEAL. 120 tablet 3  ? Cholecalciferol (VITAMIN D) 2000 UNITS CAPS Take 1 capsule by mouth daily.    ? citalopram (CELEXA) 10  MG tablet Take 1 tablet (10 mg total) by mouth daily. D/c effexor 37.5 90 tablet 3  ? fenofibrate (TRICOR) 145 MG tablet Take 1 tablet (145 mg total) by mouth daily. 30 tablet 5  ? gabapentin (NEURONTIN) 100 MG capsule Take 1 capsule (100 mg total) by mouth at bedtime. 90 capsule 3  ? LORazepam (ATIVAN) 0.5 MG tablet Take 0.5-1 tablets (0.25-0.5 mg total) by mouth daily as needed for anxiety. 30 tablet 2  ? losartan (COZAAR) 100 MG tablet Take 1 tablet (100 mg total) by mouth daily. D/c 50 mg 90 tablet 3  ? Omega-3 Fatty Acids (FISH OIL) 1200 MG CAPS Take 1 capsule by mouth 2 (two) times daily.    ? omeprazole (PRILOSEC) 20 MG capsule TAKE 1 CAPSULE BY MOUTH TWICE A DAY 180 capsule 0  ? simvastatin (ZOCOR) 20 MG tablet TAKE 1 TABLET BY MOUTH EVERY DAY AT 6PM 90 tablet 1  ? vitamin B-12 (CYANOCOBALAMIN) 1000 MCG tablet Take 1,000 mcg by mouth daily.    ? ?Current Facility-Administered Medications  ?Medication Dose Route Frequency Provider Last Rate Last Admin  ? betamethasone acetate-betamethasone sodium phosphate (CELESTONE) injection 3 mg  3 mg Intra-articular Once Edrick Kins, DPM      ?  ? ?Review of Systems  ?Please see the history of present illness.    ?(+)Non pitting edema ? ?All other systems reviewed and are otherwise negative except as noted above. ? ?Physical Exam  ?  ?Wt Readings from Last 3 Encounters:  ?03/19/22 164 lb 9.6 oz (74.7 kg)  ?02/26/22 166 lb 9.6 oz (75.6 kg)  ?02/04/22 166 lb (75.3 kg)  ? ?OA:CZYSA were no vitals filed for this visit.,There is no height or weight on file to calculate BMI. ? ?Constitutional:   ?   Appearance: Healthy appearance. Not in distress.  ?Neck:  ?   Vascular: JVD normal.  ?Pulmonary:  ?   Effort: Pulmonary effort is normal.  ?   Breath sounds: No wheezing. No rales. Diminished in the bases ?Cardiovascular:  ?   Normal rate. Regular rhythm. Normal S1. Normal S2.   ?   Murmurs: There is no murmur.  ?Edema: ?   Peripheral edema absent.  ?Abdominal:  ?   Palpations:  Abdomen is soft non tender. There is no hepatomegaly.  ?Skin: ?   General: Skin is warm and dry.  ?Neurological:  ?   General: No focal deficit present.  ?   Mental Status: Alert and oriented to person, place and time.  ?   Cranial Nerves: Cranial nerves are intact.  ?EKG/LABS/Other Studies Reviewed  ?  ?ECG personally reviewed by me today - None completed today ? ?Lab Results  ?Component Value Date  ? WBC 6.6 04/29/2021  ? HGB 13.0 04/29/2021  ? HCT 37.5 04/29/2021  ? MCV 96.8 04/29/2021  ? PLT 225.0 04/29/2021  ? ?Lab  Results  ?Component Value Date  ? CREATININE 1.14 02/26/2022  ? BUN 19 02/26/2022  ? NA 132 (L) 02/26/2022  ? K 4.4 02/26/2022  ? CL 99 02/26/2022  ? CO2 26 02/26/2022  ? ?Lab Results  ?Component Value Date  ? ALT 22 04/29/2021  ? AST 20 04/29/2021  ? ALKPHOS 69 04/29/2021  ? BILITOT 0.4 04/29/2021  ? ?Lab Results  ?Component Value Date  ? CHOL 169 04/17/2022  ? HDL 55 04/17/2022  ? [place] 91 04/17/2022  ? LDLDIRECT 123.0 04/29/2021  ? TRIG 114 04/17/2022  ? CHOLHDL 3.1 04/17/2022  ?  ?Lab Results  ?Component Value Date  ? HGBA1C 5.8 03/12/2016  ? ? ?Assessment & Plan  ?  ?1.  Essential hypertension: ?-Patient's blood pressure today was 138/60 ?-HCTZ discontinued by endocrinology due to hyponatremia with ?-Continue carvedilol 3.125 twice daily, amlodipine 10 mg daily, losartan 100 mg daily ?-Continue low-sodium heart healthy diet ?-She was advised to monitor her blood pressures for the next 2 weeks and report any blood pressures with systolic greater than 161-096 consistently ? ?2.  Hyperlipidemia: ?-Patient started on fenofibrate last appointment due to increased triglycerides. ?-Continue simvastatin 20 mg daily and fenofibrate 145 mg daily ?-Lipids completed on 5/5 with total cholesterol 169, triglycerides 114, LDL is 91, HDL 55. ? ?3.  Hyponatremia: ?-HCTZ discontinued by endocrinology and BP today at 138/60 ? ?4.  Systolic murmur: ?-Echo completed 12/2021 with mild mitral valve regurgitation  noted.  Patient endorses no signs and symptoms of lower extremity swelling or SOB at this time ? ?Disposition: Follow-up with Kate Sable, MD or APP in 6 months ?   ?Medication Adjustments/Labs and T

## 2022-04-22 ENCOUNTER — Ambulatory Visit (INDEPENDENT_AMBULATORY_CARE_PROVIDER_SITE_OTHER): Payer: Medicare Other | Admitting: Nurse Practitioner

## 2022-04-22 ENCOUNTER — Encounter: Payer: Self-pay | Admitting: Nurse Practitioner

## 2022-04-22 VITALS — BP 138/60 | HR 86 | Ht 61.0 in | Wt 169.5 lb

## 2022-04-22 DIAGNOSIS — R011 Cardiac murmur, unspecified: Secondary | ICD-10-CM | POA: Diagnosis not present

## 2022-04-22 DIAGNOSIS — E782 Mixed hyperlipidemia: Secondary | ICD-10-CM | POA: Diagnosis not present

## 2022-04-22 DIAGNOSIS — I10 Essential (primary) hypertension: Secondary | ICD-10-CM | POA: Diagnosis not present

## 2022-04-22 DIAGNOSIS — E871 Hypo-osmolality and hyponatremia: Secondary | ICD-10-CM | POA: Diagnosis not present

## 2022-04-22 NOTE — Patient Instructions (Addendum)
Please monitor blood pressures and keep a log of your readings.  ? ?Make sure to check 2 hours after your medications.  ? ?AVOID these things for 30 minutes before checking your blood pressure: ?No Drinking caffeine. ?No Drinking alcohol. ?No Eating. ?No Smoking. ?No Exercising. ? ?Five minutes before checking your blood pressure: ?Pee. ?Sit in a dining chair. Avoid sitting in a soft couch or armchair. ?Be quiet. Do not talk. ? ?Please give [place] a call if top blood pressure readings remains consistently greater than 130.  ? ? ?Medication Instructions:  ?No changes at this time.  ? ?*If you need a refill on your cardiac medications before your next appointment, please call your pharmacy* ? ? ?Lab Work: ?None ? ?If you have labs (blood work) drawn today and your tests are completely normal, you will receive your results only by: ?MyChart Message (if you have MyChart) OR ?A paper copy in the mail ?If you have any lab test that is abnormal or we need to change your treatment, we will call you to review the results. ? ? ?Testing/Procedures: ?None ? ? ?Follow-Up: ?At [facility], you and your health needs are our priority.  As part of our continuing mission to provide you with exceptional heart care, we have created designated Provider Care Teams.  These Care Teams include your primary Cardiologist (physician) and Advanced Practice Providers (APPs -  Physician Assistants and Nurse Practitioners) who all work together to provide you with the care you need, when you need it. ? ? ?Your next appointment:   ?6 month(s) ? ?The format for your next appointment:   ?In Person ? ?Provider:   ?Kate Sable, MD or Murray Hodgkins, NP  ? ? ? ? ? ?Important Information About Sugar ? ? ? ? ?  ?

## 2022-04-24 ENCOUNTER — Other Ambulatory Visit: Payer: Self-pay | Admitting: Internal Medicine

## 2022-04-24 DIAGNOSIS — I10 Essential (primary) hypertension: Secondary | ICD-10-CM

## 2022-05-05 ENCOUNTER — Telehealth: Payer: Self-pay | Admitting: Family

## 2022-05-05 NOTE — Telephone Encounter (Signed)
Copied from [place] [id number]. Topic: Medicare AWV >> May 05, 2022  1:08 PM Harris-Coley, Hannah Beat wrote: Reason for CRM: Left message for patient to schedule Annual Wellness Visit.  Please schedule with Nurse Health Advisor Denisa O'Brien-Blaney, LPN at [facility].  Please call [phone] ask for [facility]

## 2022-05-21 ENCOUNTER — Other Ambulatory Visit: Payer: Self-pay

## 2022-05-21 MED ORDER — FENOFIBRATE 145 MG PO TABS: 145.0000 mg | ORAL_TABLET | Freq: Every day | ORAL | 4 refills | Status: DC

## 2022-06-04 ENCOUNTER — Other Ambulatory Visit: Payer: Self-pay | Admitting: Family

## 2022-06-04 ENCOUNTER — Other Ambulatory Visit: Payer: Self-pay | Admitting: Internal Medicine

## 2022-06-04 DIAGNOSIS — I10 Essential (primary) hypertension: Secondary | ICD-10-CM

## 2022-06-04 DIAGNOSIS — K219 Gastro-esophageal reflux disease without esophagitis: Secondary | ICD-10-CM

## 2022-06-04 DIAGNOSIS — F419 Anxiety disorder, unspecified: Secondary | ICD-10-CM

## 2022-06-15 ENCOUNTER — Telehealth: Payer: Self-pay | Admitting: Family

## 2022-06-15 NOTE — Telephone Encounter (Signed)
Copied from [place] [id number]. Topic: Medicare AWV >> Jun 15, 2022 11:20 AM Devoria Glassing wrote: Reason for CRM: Left message for patient to schedule Annual Wellness Visit.  Please schedule with Nurse Health Advisor Denisa O'Brien-Blaney, LPN at [facility].  Please call [phone] ask for [facility]

## 2022-06-30 ENCOUNTER — Encounter: Payer: Self-pay | Admitting: Internal Medicine

## 2022-06-30 ENCOUNTER — Ambulatory Visit (INDEPENDENT_AMBULATORY_CARE_PROVIDER_SITE_OTHER): Payer: Medicare Other | Admitting: Internal Medicine

## 2022-06-30 VITALS — BP 130/82 | HR 69 | Ht 61.0 in | Wt 167.2 lb

## 2022-06-30 DIAGNOSIS — E871 Hypo-osmolality and hyponatremia: Secondary | ICD-10-CM

## 2022-06-30 LAB — BASIC METABOLIC PANEL
BUN: 22 mg/dL (ref 6–23)
CO2: 27 mEq/L (ref 19–32)
Calcium: 9.4 mg/dL (ref 8.4–10.5)
Chloride: 101 mEq/L (ref 96–112)
Creatinine, Ser: 1.03 mg/dL (ref 0.40–1.20)
GFR: 52.84 mL/min — ABNORMAL LOW (ref >60.00)
Glucose, Bld: 93 mg/dL (ref 70–99)
Potassium: 4.5 mEq/L (ref 3.5–5.1)
Sodium: 134 mEq/L — ABNORMAL LOW (ref 135–145)

## 2022-06-30 NOTE — Progress Notes (Unsigned)
Name: Kristen Powell  MRN/ DOB: [id number], [birth year]    Age/ Sex: 76 y.o., female    PCP: Burnard Hawthorne, FNP   Reason for Endocrinology Evaluation: Hyponatremia      Date of Initial Endocrinology Evaluation: 02/04/2022    HPI: Ms. Kristen Powell is a 76 y.o. female with a past medical history of HTN, GERD and DJD. The patient presented for initial endocrinology clinic visit on 02/04/2022 for consultative assistance with her Hyponatremia .   Pt has been noted with intermittent hyponatremia since 2016 with a nadir of 128 mmol/L in 2016.    The pt is on HCTZ for HTN for 6 weeks due to LE edema per pt. Echo showed MR, EF 60-65%  She was drinking 4 bottles of water a day plus 3-4 cups of coffee, occasional juice , on her initial visit she was advised to reduce her liquid intake by 50% and avoid juice, we also stopped her HCTZ with serum sodium of 129 mmol/L  No prior hx of tobacco use    SUBJECTIVE:    Today (06/30/22):  Ms. Kristen Powell is here for a follow up on hyponatremia.  Today she coninutes to avoid juice, now she drinks 1.5 cups of coffee a day. She is not sure how much water she is drinking   Denies dizziness  Denies nausea  Denies headaches  She has noted LE swelling when she is on her feet, but resolve with leg elevation  She is on low salt diet   She brought BP readings from home : 142/52, 114/60, 124/52, 134/57, 135/58     HISTORY:  Past Medical History:  Past Medical History:  Diagnosis Date   Anxiety    COVID-19    Hyperlipidemia    Hypertension    Psoriasis    younger age   Spinal stenosis    Traumatic rupture of left ear drum    Past Surgical History:  Past Surgical History:  Procedure Laterality Date   ABDOMINAL HYSTERECTOMY  12/14/1978   Partial   BASAL CELL CARCINOMA EXCISION  2006,2008,2011   BUNIONECTOMY     BUNIONECTOMY Bilateral 12/14/1990   COLONOSCOPY WITH PROPOFOL N/A 12/09/2021   Procedure: COLONOSCOPY WITH PROPOFOL;   Surgeon: Lucilla Lame, MD;  Location: [facility];  Service: Endoscopy;  Laterality: N/A;   KNEE ARTHROSCOPY     KNEE CLOSED REDUCTION  05/07/2015   Procedure: CLOSED MANIPULATION KNEE;  Surgeon: Hessie Knows, MD;  Location: [facility] ORS;  Service: Orthopedics;;   KNEE SURGERY Bilateral 2005,2006   Repair   MOHS SURGERY     nose bcc, left cheeck Dr. Delice Lesch f/u Q 12/2020   REPLACEMENT TOTAL KNEE Left 04/11/2015   manipulation--05/07/2015   ROTATOR CUFF REPAIR W/ DISTAL CLAVICLE EXCISION     SHOULDER SURGERY Right    x2   SKIN CANCER EXCISION     TOTAL KNEE ARTHROPLASTY     VAGINAL HYSTERECTOMY      Social History:  reports that she has never smoked. She has never used smokeless tobacco. She reports that she does not drink alcohol and does not use drugs. Family History: family history includes Breast cancer in her paternal aunt; Cancer in her mother; Heart attack (age of onset: 57) in her sister; Hypertension in her sister.   HOME MEDICATIONS: Allergies as of 06/30/2022       Reactions   Adhesive [tape] Rash   redness        Medication List  Accurate as of June 30, 2022  8:56 AM. If you have any questions, ask your nurse or doctor.          acetaminophen 500 MG tablet Commonly known as: TYLENOL Take 1-2 tablets (500-1,000 mg total) by mouth every 4 (four) hours as needed for fever.   amLODipine 10 MG tablet Commonly known as: NORVASC Take 1 tablet (10 mg total) by mouth daily.   aspirin 81 MG tablet Take 81 mg by mouth daily.   calcium carbonate 600 MG Tabs tablet Commonly known as: OS-CAL Take 600 mg by mouth daily as needed.   carvedilol 3.125 MG tablet Commonly known as: COREG TAKE 1 TABLET (3.125 MG TOTAL) BY MOUTH 2 (TWO) TIMES DAILY WITH A MEAL.   citalopram 10 MG tablet Commonly known as: CELEXA TAKE 1 TABLET (10 MG TOTAL) BY MOUTH DAILY. D/C EFFEXOR 37.5   fenofibrate 145 MG tablet Commonly known as: Tricor Take 1 tablet (145 mg total) by mouth  daily.   Fish Oil 1200 MG Caps Take 1 capsule by mouth 2 (two) times daily.   gabapentin 100 MG capsule Commonly known as: NEURONTIN Take 1 capsule (100 mg total) by mouth at bedtime.   LORazepam 0.5 MG tablet Commonly known as: ATIVAN Take 0.5-1 tablets (0.25-0.5 mg total) by mouth daily as needed for anxiety.   losartan 100 MG tablet Commonly known as: COZAAR TAKE 1 TABLET (100 MG TOTAL) BY MOUTH DAILY. D/C 50 MG   omeprazole 20 MG capsule Commonly known as: PRILOSEC TAKE 1 CAPSULE BY MOUTH TWICE A DAY   simvastatin 20 MG tablet Commonly known as: ZOCOR TAKE 1 TABLET BY MOUTH EVERY DAY AT 6PM   vitamin B-12 1000 MCG tablet Commonly known as: CYANOCOBALAMIN Take 1,000 mcg by mouth daily.   Vitamin D 50 MCG (2000 UT) Caps Take 1 capsule by mouth daily.          REVIEW OF SYSTEMS: A comprehensive ROS was conducted with the patient     OBJECTIVE:  VS: BP 130/82 (BP Location: Left Arm, Patient Position: Sitting, Cuff Size: Large)   Pulse 69   Ht '5\' 1"'$  (1.549 m)   Wt 167 lb 3.2 oz (75.8 kg)   SpO2 97%   BMI 31.59 kg/m    Wt Readings from Last 3 Encounters:  06/30/22 167 lb 3.2 oz (75.8 kg)  04/22/22 169 lb 8 oz (76.9 kg)  03/19/22 164 lb 9.6 oz (74.7 kg)     EXAM: General: Pt appears well and is in NAD  Eyes: External eye exam normal without stare, lid lag or exophthalmos.  EOM intact.    Neck: General: Supple without adenopathy. Thyroid: Thyroid size normal.  No goiter or nodules appreciated.   Lungs: Clear with good BS bilat with no rales, rhonchi, or wheezes  Heart: Auscultation: RRR.+ systolic murmur   Extremities:  BL LE: Trace  pretibial edema around the ankles and feet   Mental Status: Judgment, insight: Intact Orientation: Oriented to time, place, and person Mood and affect: No depression, anxiety, or agitation     DATA REVIEWED:   Latest Reference Range & Units 06/30/22 09:19  Sodium 135 - 145 mEq/L 134 (L)  Potassium 3.5 - 5.1 mEq/L  4.5  Chloride 96 - 112 mEq/L 101  CO2 19 - 32 mEq/L 27  Glucose 70 - 99 mg/dL 93  BUN 6 - 23 mg/dL 22  Creatinine 0.40 - 1.20 mg/dL 1.03  Calcium 8.4 - 10.5 mg/dL 9.4  GFR >60.00  mL/min 52.84 (L)     Latest Reference Range & Units 02/26/22 09:57  Glucose 70 - 99 mg/dL 85  TSH 0.35 - 5.50 uIU/mL 3.18  T4,Free(Direct) 0.60 - 1.60 ng/dL 0.81    Latest Reference Range & Units 02/26/22 09:57  Sodium, Urine 28 - 272 mmol/L 92    Latest Reference Range & Units 02/26/22 09:57  Osmolality, Urine 50 - 1,200 mOsm/kg 346    Latest Reference Range & Units 02/26/22 09:57  Osmolality 278 - 305 mOsm/kg 287    Latest Reference Range & Units 02/26/22 09:57  Sodium 135 - 145 mEq/L 132 (L)  Potassium 3.5 - 5.1 mEq/L 4.4  Chloride 96 - 112 mEq/L 99  CO2 19 - 32 mEq/L 26  Glucose 70 - 99 mg/dL 85  BUN 6 - 23 mg/dL 19  Creatinine 0.40 - 1.20 mg/dL 1.14  Calcium 8.4 - 10.5 mg/dL 9.5  GFR >60.00 mL/min 46.89 (L)   ASSESSMENT/PLAN/RECOMMENDATIONS:   Hyponatremia    - Pt is asymptomatic due to chronicity  -Her sodium has improved since the discontinuation of HCTZ  -Her hyponatremia is due to low effective arterial blood volume which is evidenced by peripheral edema, the cause of this is unclear to me, as I don't see Hx of CHF or Cirrhosis in the chart. Low effective arterial blood volume will cause release of ADH.  -So far she has been responded well to fluid restriction -Serum sodium has improved to 134 mEq/L.  Which is at goal of serum sodium >130 mill [place]/L  F/U in 6 months   Signed electronically by: Mack Guise, MD  [facility]  [address] [place], [place] [address] Phone: [phone] FAX: [phone]   CC: Burnard Hawthorne, FNP [address] [place] [address] Phone: [phone] Fax: [phone]   Return to Endocrinology clinic as below: Future Appointments  Date Time Provider  [place]  09/18/2022  8:30 AM Burnard Hawthorne, FNP [id number]  10/28/2022  9:15 AM Theora Gianotti, NP [id number]

## 2022-07-02 ENCOUNTER — Telehealth: Payer: Self-pay | Admitting: Family

## 2022-07-02 NOTE — Telephone Encounter (Signed)
Copied from [place] [id number]. Topic: Medicare AWV >> Jul 02, 2022 11:07 AM Devoria Glassing wrote: Reason for CRM: Left message for patient to schedule Annual Wellness Visit.  Please schedule with Nurse Health Advisor Denisa O'Brien-Blaney, LPN at [facility]. This appt can be telephone or office visit.  Please call [phone] ask for [facility]

## 2022-07-29 ENCOUNTER — Other Ambulatory Visit: Payer: Self-pay | Admitting: Internal Medicine

## 2022-07-29 DIAGNOSIS — F419 Anxiety disorder, unspecified: Secondary | ICD-10-CM

## 2022-08-19 ENCOUNTER — Other Ambulatory Visit: Payer: Self-pay | Admitting: Internal Medicine

## 2022-08-19 DIAGNOSIS — M19071 Primary osteoarthritis, right ankle and foot: Secondary | ICD-10-CM

## 2022-08-19 DIAGNOSIS — G629 Polyneuropathy, unspecified: Secondary | ICD-10-CM

## 2022-08-21 ENCOUNTER — Ambulatory Visit (INDEPENDENT_AMBULATORY_CARE_PROVIDER_SITE_OTHER): Payer: Medicare Other

## 2022-08-21 VITALS — BP 128/55 | HR 70 | Ht 61.0 in | Wt 167.0 lb

## 2022-08-21 DIAGNOSIS — Z0000 Encounter for general adult medical examination without abnormal findings: Secondary | ICD-10-CM | POA: Diagnosis not present

## 2022-08-21 NOTE — Progress Notes (Signed)
Subjective:   Kristen Powell is a 76 y.o. female who presents for Medicare Annual (Subsequent) preventive examination.  Review of Systems    No ROS.  Medicare Wellness Virtual Visit.  Visual/audio telehealth visit, UTA vital signs.   See social history for additional risk factors.   Cardiac Risk Factors include: advanced age (>51mn, >>52women)     Objective:    Today's Vitals   08/21/22 1039  BP: (!) 128/55  Pulse: 70  Weight: 167 lb (75.8 kg)  Height: '5\' 1"'$  (1.549 m)   Body mass index is 31.55 kg/m.     08/21/2022   10:37 AM 12/09/2021    9:18 AM 04/15/2015    1:44 PM  Advanced Directives  Does Patient Have a Medical Advance Directive? Yes No No  Type of AParamedicof ADe [place]Living will    Does patient want to make changes to medical advance directive? No - Patient declined    Copy of HSpartain Chart? No - copy requested    Would patient like information on creating a medical advance directive?   Yes - Educational materials given    Current Medications (verified) Outpatient Encounter Medications as of 08/21/2022  Medication Sig   acetaminophen (TYLENOL) 500 MG tablet Take 1-2 tablets (500-1,000 mg total) by mouth every 4 (four) hours as needed for fever.   amLODipine (NORVASC) 10 MG tablet Take 1 tablet (10 mg total) by mouth daily.   aspirin 81 MG tablet Take 81 mg by mouth daily.   calcium carbonate (OS-CAL) 600 MG TABS tablet Take 600 mg by mouth daily as needed.   carvedilol (COREG) 3.125 MG tablet TAKE 1 TABLET (3.125 MG TOTAL) BY MOUTH 2 (TWO) TIMES DAILY WITH A MEAL.   Cholecalciferol (VITAMIN D) 2000 UNITS CAPS Take 1 capsule by mouth daily.   citalopram (CELEXA) 10 MG tablet TAKE 1 TABLET (10 MG TOTAL) BY MOUTH DAILY. D/C EFFEXOR 37.5   fenofibrate (TRICOR) 145 MG tablet Take 1 tablet (145 mg total) by mouth daily.   gabapentin (NEURONTIN) 100 MG capsule TAKE 1 CAPSULE BY MOUTH AT BEDTIME.   LORazepam  (ATIVAN) 0.5 MG tablet TAKE 0.5-1 TABLETS (0.25-0.5 MG TOTAL) BY MOUTH DAILY AS NEEDED FOR ANXIETY   losartan (COZAAR) 100 MG tablet TAKE 1 TABLET (100 MG TOTAL) BY MOUTH DAILY. D/C 50 MG   Omega-3 Fatty Acids (FISH OIL) 1200 MG CAPS Take 1 capsule by mouth 2 (two) times daily.   omeprazole (PRILOSEC) 20 MG capsule TAKE 1 CAPSULE BY MOUTH TWICE A DAY   simvastatin (ZOCOR) 20 MG tablet TAKE 1 TABLET BY MOUTH EVERY DAY AT 6PM   vitamin B-12 (CYANOCOBALAMIN) 1000 MCG tablet Take 1,000 mcg by mouth daily.   Facility-Administered Encounter Medications as of 08/21/2022  Medication   betamethasone acetate-betamethasone sodium phosphate (CELESTONE) injection 3 mg    Allergies (verified) Adhesive [tape]   History: Past Medical History:  Diagnosis Date   Anxiety    COVID-19    Hyperlipidemia    Hypertension    Psoriasis    younger age   Spinal stenosis    Traumatic rupture of left ear drum    Past Surgical History:  Procedure Laterality Date   ABDOMINAL HYSTERECTOMY  12/14/1978   Partial   BASAL CELL CARCINOMA EXCISION  2006,2008,2011   BUNIONECTOMY     BUNIONECTOMY Bilateral 12/14/1990   COLONOSCOPY WITH PROPOFOL N/A 12/09/2021   Procedure: COLONOSCOPY WITH PROPOFOL;  Surgeon: WLucilla Lame MD;  Location: [facility] END[facility]COPY;  Service: Endoscopy;  Laterality: N/A;   KNEE ARTHROSCOPY     KNEE CLOSED REDUCTION  05/07/2015   Procedure: CLOSED MANIPULATION KNEE;  Surgeon: Hessie Knows, MD;  Location: [facility] ORS;  Service: Orthopedics;;   KNEE SURGERY Bilateral 2005,2006   Repair   MOHS SURGERY     nose bcc, left cheeck Dr. Delice Lesch f/u Q 12/2020   REPLACEMENT TOTAL KNEE Left 04/11/2015   manipulation--05/07/2015   ROTATOR CUFF REPAIR W/ DISTAL CLAVICLE EXCISION     SHOULDER SURGERY Right    x2   SKIN CANCER EXCISION     TOTAL KNEE ARTHROPLASTY     VAGINAL HYSTERECTOMY     Family History  Problem Relation Age of Onset   Cancer Mother        lung   Hypertension Sister    Heart attack  Sister 39   Breast cancer Paternal Aunt    Social History   Socioeconomic History   Marital status: Widowed    Spouse name: Not on file   Number of children: Not on file   Years of education: Not on file   Highest education level: Not on file  Occupational History   Not on file  Tobacco Use   Smoking status: Never   Smokeless tobacco: Never  Vaping Use   Vaping Use: Never used  Substance and Sexual Activity   Alcohol use: No   Drug use: No   Sexual activity: Never  Other Topics Concern   Not on file  Social History Narrative   Widowed x 12 years as of 06/2021    3 sisters and 1 brother    Has children son age 86, daughter 55, son 26 as of 06/2021   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      44 great grandchildren         Social Determinants of Health   Financial Resource Strain: Low Risk  (08/21/2022)   Overall Financial Resource Strain (CARDIA)    Difficulty of Paying Living Expenses: Not hard at all  Food Insecurity: No Food Insecurity (08/21/2022)   Hunger Vital Sign    Worried About Running Out of Food in the Last Year: Never true    [place] in the Last Year: Never true  Transportation Needs: No Transportation Needs (08/21/2022)   PRAPARE - Hydrologist (Medical): No    Lack of Transportation (Non-Medical): No  Physical Activity: Sufficiently Active (08/21/2022)   Exercise Vital Sign    Days of Exercise per Week: 5 days    Minutes of Exercise per Session: 30 min  Stress: No Stress Concern Present (08/21/2022)   [place]    Feeling of Stress : Not at all  Social Connections: Unknown (08/21/2022)   Social Connection and Isolation Panel [NHANES]    Frequency of Communication with Friends and Family: More than three times a week    Frequency of Social Gatherings with Friends and Family: More than three times a week    Attends Religious Services: More than 4 times per year     Active Member of [employer] or Organizations: Not on file    Attends Music therapist: More than 4 times per year    Marital Status: Not on file    Tobacco Counseling Counseling given: Not Answered   Clinical Intake:  Pre-visit preparation completed: Yes        Diabetes:  No  How often do you need to have someone help you when you read instructions, pamphlets, or other written materials from your doctor or pharmacy?: 1 - Never    Interpreter Needed?: No      Activities of Daily Living    08/21/2022   10:45 AM  In your present state of health, do you have any difficulty performing the following activities:  Hearing? 0  Vision? 0  Difficulty concentrating or making decisions? 0  Walking or climbing stairs? 0  Comment Cane in close proximity as needed  Dressing or bathing? 0  Doing errands, shopping? 0  Preparing Food and eating ? N  Comment microwave in use for most meals.  Using the Toilet? N  In the past six months, have you accidently leaked urine? N  Do you have problems with loss of bowel control? N  Managing your Medications? N  Managing your Finances? N  Housekeeping or managing your Housekeeping? N    Patient Care Team: Burnard Hawthorne, FNP as PCP - General (Family Medicine) Kate Sable, MD as PCP - Cardiology (Cardiology)  Indicate any recent Medical Services you may have received from other than [facility] providers in the past year (date may be approximate).     Assessment:   This is a routine wellness examination for [place].  Virtual Visit via Telephone Note  I connected with  Azaylah Stailey on 08/21/22 at 10:30 AM EDT by telephone and verified that I am speaking with the correct person using two identifiers.  Location: Patient: home  Provider: office Persons participating in the virtual visit: patient/Nurse Health Advisor   I discussed the limitations of performing an evaluation and management service by telehealth. We  continued and completed visit with audio only. Some vital signs may be absent or patient reported.   Hearing/Vision screen Hearing Screening - Comments:: Patient is able to hear conversational tones without difficulty.  No issues reported.  Vision Screening - Comments:: Follow by [facility]--cataract surgery  Dietary issues and exercise activities discussed: Current Exercise Habits: Home exercise routine, Type of exercise: calisthenics;walking, Time (Minutes): 30, Frequency (Times/Week): 5, Weekly Exercise (Minutes/Week): 150, Intensity: Mild She tries to have a healthy diet Good water intake   Goals Addressed             This Visit's Progress    Maintain healthy lifestyle       Stay active Stay hydrated       Depression Screen    08/21/2022   10:53 AM 11/05/2021    8:59 AM 04/29/2021    9:04 AM 08/08/2020    8:32 AM 08/07/2019    8:19 AM 08/01/2018    8:26 AM 07/29/2017    8:23 AM  PHQ 2/9 Scores  PHQ - 2 Score 0 0 0 0 0 0 0  PHQ- 9 Score    0 0      Fall Risk    08/21/2022   10:54 AM 03/18/2022    4:38 PM 11/05/2021    8:59 AM 07/04/2021   11:30 AM 06/20/2021   11:49 AM  Fall Risk   Falls in the past year? 1 0 0 0 1  Number falls in past yr: 0 0 0 0 0  Injury with Fall? 0 0 0 0 0  Risk for fall due to :  No Fall Risks     Follow up Falls evaluation completed Falls evaluation completed  Falls evaluation completed Falls evaluation completed  FALL RISK PREVENTION PERTAINING TO THE HOME: Any stairs in or around the home? Yes  If so, are there any without handrails? No  Home free of loose throw rugs in walkways, pet beds, electrical cords, etc? Yes  Adequate lighting in your home to reduce risk of falls? Yes   ASSISTIVE DEVICES UTILIZED TO PREVENT FALLS: Life alert? No  Use of a cane, walker or w/c? No  Grab bars in the bathroom? No  Shower chair or bench in shower? Yes  Elevated toilet seat or a handicapped toilet? No   TIMED UP  AND GO: Was the test performed? No .   Cognitive Function:        08/21/2022   10:59 AM  6CIT Screen  What Year? 0 points  What month? 0 points  What time? 0 points  Count back from 20 0 points  Months in reverse 0 points  Repeat phrase 0 points  Total Score 0 points    Immunizations Immunization History  Administered Date(s) Administered   Fluad Quad(high Dose 65+) 10/02/2019, 09/05/2020, 08/29/2021   Influenza Split 09/13/2013   Influenza, High Dose Seasonal PF 10/10/2018   Influenza, Seasonal, Injecte, Preservative Fre 09/09/2016   Influenza,inj,Quad PF,6+ Mos 08/13/2014, 09/15/2015   PFIZER(Purple Top)SARS-COV-2 Vaccination 01/31/2020, 02/21/2020, 03/14/2020, 11/26/2020   Pneumococcal Conjugate-13 07/11/2014   Pneumococcal Polysaccharide-23 07/25/2015   Td 01/05/2014   Zoster Recombinat (Shingrix) 06/22/2018, 08/23/2018   Zoster, Live 07/11/2014    Flu Vaccine status: Due, Education has been provided regarding the importance of this vaccine. Advised may receive this vaccine at local pharmacy or Health Dept. Aware to provide a copy of the vaccination record if obtained from local pharmacy or Health Dept. Verbalized acceptance and understanding. Deferred.   Covid-19 vaccine status: Completed vaccines x4.  Screening Tests Health Maintenance  Topic Date Due   COVID-19 Vaccine (5 - Pfizer series) 09/06/2022 (Originally 01/21/2021)   INFLUENZA VACCINE  03/14/2023 (Originally 07/14/2022)   TETANUS/TDAP  01/06/2024   Pneumonia Vaccine 72+ Years old  Completed   DEXA SCAN  Completed   Hepatitis C Screening  Completed   Zoster Vaccines- Shingrix  Completed   HPV VACCINES  Aged Out   COLONOSCOPY (Pts 45-59yr Insurance coverage will need to be confirmed)  Discontinued   Health Maintenance There are no preventive care reminders to display for this patient.  Lung Cancer Screening: (Low Dose CT Chest recommended if Age 76-80years, 30 pack-year currently smoking OR have quit  w/in 15years.) does not qualify.   Vision Screening: Recommended annual ophthalmology exams for early detection of glaucoma and other disorders of the eye.  Dental Screening: Recommended annual dental exams for proper oral hygiene  Community Resource Referral / Chronic Care Management: CRR required this visit?  No   CCM required this visit?  No      Plan:     I have personally reviewed and noted the following in the patient's chart:   Medical and social history Use of alcohol, tobacco or illicit drugs  Current medications and supplements including opioid prescriptions. Patient is not currently taking opioid prescriptions. Functional ability and status Nutritional status Physical activity Advanced directives List of other physicians Hospitalizations, surgeries, and ER visits in previous 12 months Vitals Screenings to include cognitive, depression, and falls Referrals and appointments  In addition, I have reviewed and discussed with patient certain preventive protocols, quality metrics, and best practice recommendations. A written personalized care plan for preventive services as well as general preventive health recommendations were  provided to patient.     Varney Biles, LPN   08/22/3569

## 2022-08-21 NOTE — Patient Instructions (Addendum)
Ms. Kristen Powell , Thank you for taking time to come for your Medicare Wellness Visit. I appreciate your ongoing commitment to your health goals. Please review the following plan we discussed and let me know if I can assist you in the future.   These are the goals we discussed:  Goals      Maintain healthy lifestyle     Stay active Stay hydrated        This is a list of the screening recommended for you and due dates:  Health Maintenance  Topic Date Due   COVID-19 Vaccine (5 - Pfizer series) 09/06/2022*   Flu Shot  03/14/2023*   Tetanus Vaccine  01/06/2024   Pneumonia Vaccine  Completed   DEXA scan (bone density measurement)  Completed   Hepatitis C Screening: USPSTF Recommendation to screen - Ages 93-79 yo.  Completed   Zoster (Shingles) Vaccine  Completed   HPV Vaccine  Aged Out   Colon Cancer Screening  Discontinued  *Topic was postponed. The date shown is not the original due date.    Next appointment: Follow up in one year for your annual wellness visit    Preventive Care 65 Years and Older, Female Preventive care refers to lifestyle choices and visits with your health care provider that can promote health and wellness. What does preventive care include? A yearly physical exam. This is also called an annual well check. Dental exams once or twice a year. Routine eye exams. Ask your health care provider how often you should have your eyes checked. Personal lifestyle choices, including: Daily care of your teeth and gums. Regular physical activity. Eating a healthy diet. Avoiding tobacco and drug use. Limiting alcohol use. Practicing safe sex. Taking low-dose aspirin every day. Taking vitamin and mineral supplements as recommended by your health care provider. What happens during an annual well check? The services and screenings done by your health care provider during your annual well check will depend on your age, overall health, lifestyle risk factors, and family history  of disease. Counseling  Your health care provider may ask you questions about your: Alcohol use. Tobacco use. Drug use. Emotional well-being. Home and relationship well-being. Sexual activity. Eating habits. History of falls. Memory and ability to understand (cognition). Work and work Statistician. Reproductive health. Screening  You may have the following tests or measurements: Height, weight, and BMI. Blood pressure. Lipid and cholesterol levels. These may be checked every 5 years, or more frequently if you are over 41 years old. Skin check. Lung cancer screening. You may have this screening every year starting at age 44 if you have a 30-pack-year history of smoking and currently smoke or have quit within the past 15 years. Fecal occult blood test (FOBT) of the stool. You may have this test every year starting at age 45. Flexible sigmoidoscopy or colonoscopy. You may have a sigmoidoscopy every 5 years or a colonoscopy every 10 years starting at age 75. Hepatitis C blood test. Hepatitis B blood test. Sexually transmitted disease (STD) testing. Diabetes screening. This is done by checking your blood sugar (glucose) after you have not eaten for a while (fasting). You may have this done every 1-3 years. Bone density scan. This is done to screen for osteoporosis. You may have this done starting at age 35. Mammogram. This may be done every 1-2 years. Talk to your health care provider about how often you should have regular mammograms. Talk with your health care provider about your test results, treatment options, and  if necessary, the need for more tests. Vaccines  Your health care provider may recommend certain vaccines, such as: Influenza vaccine. This is recommended every year. Tetanus, diphtheria, and acellular pertussis (Tdap, Td) vaccine. You may need a Td booster every 10 years. Zoster vaccine. You may need this after age 30. Pneumococcal 13-valent conjugate (PCV13) vaccine. One  dose is recommended after age 36. Pneumococcal polysaccharide (PPSV23) vaccine. One dose is recommended after age 79. Talk to your health care provider about which screenings and vaccines you need and how often you need them. This information is not intended to replace advice given to you by your health care provider. Make sure you discuss any questions you have with your health care provider. Document Released: 12/27/2015 Document Revised: 08/19/2016 Document Reviewed: 10/01/2015 Elsevier Interactive Patient Education  2017 [place] Prevention in the Home Falls can cause injuries. They can happen to people of all ages. There are many things you can do to make your home safe and to help prevent falls. What can I do on the outside of my home? Regularly fix the edges of walkways and driveways and fix any cracks. Remove anything that might make you trip as you walk through a door, such as a raised step or threshold. Trim any bushes or trees on the path to your home. Use bright outdoor lighting. Clear any walking paths of anything that might make someone trip, such as rocks or tools. Regularly check to see if handrails are loose or broken. Make sure that both sides of any steps have handrails. Any raised decks and porches should have guardrails on the edges. Have any leaves, snow, or ice cleared regularly. Use sand or salt on walking paths during winter. Clean up any spills in your garage right away. This includes oil or grease spills. What can I do in the bathroom? Use night lights. Install grab bars by the toilet and in the tub and shower. Do not use towel bars as grab bars. Use non-skid mats or decals in the tub or shower. If you need to sit down in the shower, use a plastic, non-slip stool. Keep the floor dry. Clean up any water that spills on the floor as soon as it happens. Remove soap buildup in the tub or shower regularly. Attach bath mats securely with double-sided  non-slip rug tape. Do not have throw rugs and other things on the floor that can make you trip. What can I do in the bedroom? Use night lights. Make sure that you have a light by your bed that is easy to reach. Do not use any sheets or blankets that are too big for your bed. They should not hang down onto the floor. Have a firm chair that has side arms. You can use this for support while you get dressed. Do not have throw rugs and other things on the floor that can make you trip. What can I do in the kitchen? Clean up any spills right away. Avoid walking on wet floors. Keep items that you use a lot in easy-to-reach places. If you need to reach something above you, use a strong step stool that has a grab bar. Keep electrical cords out of the way. Do not use floor polish or wax that makes floors slippery. If you must use wax, use non-skid floor wax. Do not have throw rugs and other things on the floor that can make you trip. What can I do with my stairs? Do not leave any  items on the stairs. Make sure that there are handrails on both sides of the stairs and use them. Fix handrails that are broken or loose. Make sure that handrails are as long as the stairways. Check any carpeting to make sure that it is firmly attached to the stairs. Fix any carpet that is loose or worn. Avoid having throw rugs at the top or bottom of the stairs. If you do have throw rugs, attach them to the floor with carpet tape. Make sure that you have a light switch at the top of the stairs and the bottom of the stairs. If you do not have them, ask someone to add them for you. What else can I do to help prevent falls? Wear shoes that: Do not have high heels. Have rubber bottoms. Are comfortable and fit you well. Are closed at the toe. Do not wear sandals. If you use a stepladder: Make sure that it is fully opened. Do not climb a closed stepladder. Make sure that both sides of the stepladder are locked into place. Ask  someone to hold it for you, if possible. Clearly mark and make sure that you can see: Any grab bars or handrails. First and last steps. Where the edge of each step is. Use tools that help you move around (mobility aids) if they are needed. These include: Canes. Walkers. Scooters. Crutches. Turn on the lights when you go into a dark area. Replace any light bulbs as soon as they burn out. Set up your furniture so you have a clear path. Avoid moving your furniture around. If any of your floors are uneven, fix them. If there are any pets around you, be aware of where they are. Review your medicines with your doctor. Some medicines can make you feel dizzy. This can increase your chance of falling. Ask your doctor what other things that you can do to help prevent falls. This information is not intended to replace advice given to you by your health care provider. Make sure you discuss any questions you have with your health care provider. Document Released: 09/26/2009 Document Revised: 05/07/2016 Document Reviewed: 01/04/2015 Elsevier Interactive Patient Education  2017 [employer].

## 2022-09-01 ENCOUNTER — Telehealth: Payer: Self-pay | Admitting: Family

## 2022-09-01 DIAGNOSIS — K219 Gastro-esophageal reflux disease without esophagitis: Secondary | ICD-10-CM

## 2022-09-04 ENCOUNTER — Other Ambulatory Visit: Payer: Self-pay

## 2022-09-04 DIAGNOSIS — K219 Gastro-esophageal reflux disease without esophagitis: Secondary | ICD-10-CM

## 2022-09-04 MED ORDER — OMEPRAZOLE 20 MG PO CPDR: 20.0000 mg | DELAYED_RELEASE_CAPSULE | Freq: Two times a day (BID) | ORAL | 0 refills | Status: DC

## 2022-09-04 NOTE — Telephone Encounter (Signed)
Patient is requesting a refill on her omeprazole (PRILOSEC) 20 MG capsule.

## 2022-09-04 NOTE — Telephone Encounter (Signed)
Rx sent- patient aware.  

## 2022-09-18 ENCOUNTER — Encounter: Payer: Self-pay | Admitting: Family

## 2022-09-18 ENCOUNTER — Ambulatory Visit (INDEPENDENT_AMBULATORY_CARE_PROVIDER_SITE_OTHER): Payer: Medicare Other | Admitting: Family

## 2022-09-18 VITALS — BP 136/78 | HR 75 | Temp 97.5°F | Ht 61.0 in | Wt 167.6 lb

## 2022-09-18 DIAGNOSIS — G2581 Restless legs syndrome: Secondary | ICD-10-CM

## 2022-09-18 DIAGNOSIS — E538 Deficiency of other specified B group vitamins: Secondary | ICD-10-CM | POA: Diagnosis not present

## 2022-09-18 DIAGNOSIS — I10 Essential (primary) hypertension: Secondary | ICD-10-CM

## 2022-09-18 DIAGNOSIS — R7303 Prediabetes: Secondary | ICD-10-CM | POA: Diagnosis not present

## 2022-09-18 DIAGNOSIS — Z13820 Encounter for screening for osteoporosis: Secondary | ICD-10-CM

## 2022-09-18 DIAGNOSIS — Z1231 Encounter for screening mammogram for malignant neoplasm of breast: Secondary | ICD-10-CM | POA: Diagnosis not present

## 2022-09-18 DIAGNOSIS — Z23 Encounter for immunization: Secondary | ICD-10-CM

## 2022-09-18 DIAGNOSIS — M79671 Pain in right foot: Secondary | ICD-10-CM | POA: Diagnosis not present

## 2022-09-18 DIAGNOSIS — Z780 Asymptomatic menopausal state: Secondary | ICD-10-CM

## 2022-09-18 DIAGNOSIS — G8929 Other chronic pain: Secondary | ICD-10-CM | POA: Diagnosis not present

## 2022-09-18 DIAGNOSIS — E782 Mixed hyperlipidemia: Secondary | ICD-10-CM

## 2022-09-18 DIAGNOSIS — M79672 Pain in left foot: Secondary | ICD-10-CM

## 2022-09-18 LAB — B12 AND FOLATE PANEL
Folate: 11.7 ng/mL (ref >5.9)
Vitamin B-12: 1292 pg/mL — ABNORMAL HIGH (ref 211–911)

## 2022-09-18 LAB — IBC + FERRITIN
Ferritin: 37.7 ng/mL (ref 10.0–291.0)
Iron: 87 ug/dL (ref 42–145)
Saturation Ratios: 26.4 % (ref 20.0–50.0)
TIBC: 329 ug/dL (ref 250.0–450.0)
Transferrin: 235 mg/dL (ref 212.0–360.0)

## 2022-09-18 LAB — HEMOGLOBIN A1C: Hgb A1c MFr Bld: 6 % (ref 4.6–6.5)

## 2022-09-18 LAB — CBC WITH DIFFERENTIAL/PLATELET
Basophils Absolute: 0 10*3/uL (ref 0.0–0.1)
Basophils Relative: 0.4 % (ref 0.0–3.0)
Eosinophils Absolute: 0.1 10*3/uL (ref 0.0–0.7)
Eosinophils Relative: 2.2 % (ref 0.0–5.0)
HCT: 33.7 % — ABNORMAL LOW (ref 36.0–46.0)
Hemoglobin: 11.3 g/dL — ABNORMAL LOW (ref 12.0–15.0)
Lymphocytes Relative: 23.1 % (ref 12.0–46.0)
Lymphs Abs: 1.3 10*3/uL (ref 0.7–4.0)
MCHC: 33.6 g/dL (ref 30.0–36.0)
MCV: 96.3 fl (ref 78.0–100.0)
Monocytes Absolute: 0.6 10*3/uL (ref 0.1–1.0)
Monocytes Relative: 10.3 % (ref 3.0–12.0)
Neutro Abs: 3.7 10*3/uL (ref 1.4–7.7)
Neutrophils Relative %: 64 % (ref 43.0–77.0)
Platelets: 258 10*3/uL (ref 150.0–400.0)
RBC: 3.5 Mil/uL — ABNORMAL LOW (ref 3.87–5.11)
RDW: 13.5 % (ref 11.5–15.5)
WBC: 5.8 10*3/uL (ref 4.0–10.5)

## 2022-09-18 LAB — COMPREHENSIVE METABOLIC PANEL
ALT: 20 U/L (ref 0–35)
AST: 19 U/L (ref 0–37)
Albumin: 4.1 g/dL (ref 3.5–5.2)
Alkaline Phosphatase: 68 U/L (ref 39–117)
BUN: 16 mg/dL (ref 6–23)
CO2: 27 mEq/L (ref 19–32)
Calcium: 9 mg/dL (ref 8.4–10.5)
Chloride: 101 mEq/L (ref 96–112)
Creatinine, Ser: 0.88 mg/dL (ref 0.40–1.20)
GFR: 63.72 mL/min (ref >60.00)
Glucose, Bld: 93 mg/dL (ref 70–99)
Potassium: 4.2 mEq/L (ref 3.5–5.1)
Sodium: 135 mEq/L (ref 135–145)
Total Bilirubin: 0.4 mg/dL (ref 0.2–1.2)
Total Protein: 7 g/dL (ref 6.0–8.3)

## 2022-09-18 NOTE — Assessment & Plan Note (Signed)
Lab Results  Component Value Date   LDLCALC 91 04/17/2022   Chronic, stable.  Continue simvastatin 20 mg daily, fenofibrate 145 mg daily

## 2022-09-18 NOTE — Assessment & Plan Note (Addendum)
Presentation somewhat consistent with restless leg syndrome.  Pending iron stores.  History of B12 deficiency also pending thorough investigation of B12 deficiency to ensure negative celiac, pernicious anemia as dont see done in the past.  Right pedal pulse slightly diminished.  I ordered ultrasound arterial as well. Advised we can increase gabapentin if she would like. I also advised she could trial Tylenol arthritis for suspected arthritic changes. We can pursue xray at follow up

## 2022-09-18 NOTE — Patient Instructions (Addendum)
For general aches and pains related to arthritis   As discussed, let's start by scheduling Tylenol Arthritis which is a '650mg'$  tablet .   You may take 1-2 tablets every 8 hours ( scheduled) with maximum of 6 tablets per day.   For example , you could take two tablets in the morning ( 8am) and then two tablets again at 4pm.   Maximum daily dose of acetaminophen 4 g per day from all sources.  If you are taking another medication which includes acetaminophen (Tylenol) which may be in cough and cold preparations or pain medication such as Percocet, you will need to factor that into your total daily dose to be safe.  Please let me know if any questions   Please call  and schedule your 3D mammogram and /or bone density scan as we discussed at [facility]  I have ordered ultrasound of your legs Let us know if you dont hear back within a week in regards to an appointment being scheduled.

## 2022-09-18 NOTE — Assessment & Plan Note (Signed)
Improved with second blood pressure taken today.  Do think blood pressure has increased somewhat since she has discontinued hydrochlorothiazide due to hyponatremia.  Advised patient to remain compliant with carvedilol 3.125 mg twice daily, amlodipine 10 mg daily, losartan 100 mg daily

## 2022-09-18 NOTE — Progress Notes (Signed)
Subjective:    Patient ID: Kristen Powell, female    DOB: 07-19-1946, 76 y.o.   MRN: [id number]  CC: Kristen Powell is a 76 y.o. female who presents today for follow up.   HPI: She complains intermittent 'cramps' in both feet. Describes legs as moving at night. She states h/o RLS.   Some numbness in second toe right foot after toenail removed. Otherwise, no neuropathy.   She is compliant with gabapentin '100mg'$  qhs with some relief.     No calf pain when walking. No leg swelling.  She is noted nonpainful varicosities  History of right foot surgery , bunionectomy  Follows with cardiology last seen 04/22/2022 by Ambrose Pancoast, NP.   Hypertension-compliant with carvedilol 3.125 mg twice daily, amlodipine 10 mg daily, losartan 100 mg daily. No cp, sob   She is no longer on hydrochlorothiazide as discontinued by endocrinology due to hyponatremia. She is limiting water.   Hyperlipidemia-compliant with simvastatin 20 mg daily, fenofibrate 145 mg daily  GAD-compliant with Celexa 10 mg, rare use of Xanax 0.5 mg  History of B12 deficiency. She is compliant with b12 1024mg  History of prediabetes HISTORY:  Past Medical History:  Diagnosis Date   Anxiety    COVID-19    Hyperlipidemia    Hypertension    Psoriasis    younger age   Spinal stenosis    Traumatic rupture of left ear drum    Past Surgical History:  Procedure Laterality Date   ABDOMINAL HYSTERECTOMY  12/14/1978   Partial   BASAL CELL CARCINOMA EXCISION  2006,2008,2011   BUNIONECTOMY     BUNIONECTOMY Bilateral 12/14/1990   COLONOSCOPY WITH PROPOFOL N/A 12/09/2021   Procedure: COLONOSCOPY WITH PROPOFOL;  Surgeon: WLucilla Lame MD;  Location: [facility] ENDOSCOPY;  Service: Endoscopy;  Laterality: N/A;   KNEE ARTHROSCOPY     KNEE CLOSED REDUCTION  05/07/2015   Procedure: CLOSED MANIPULATION KNEE;  Surgeon: MHessie Knows MD;  Location: [facility] ORS;  Service: Orthopedics;;   KNEE SURGERY Bilateral 2005,2006   Repair    MOHS SURGERY     nose bcc, left cheeck Dr. SDelice Leschf/u Q 12/2020   REPLACEMENT TOTAL KNEE Left 04/11/2015   manipulation--05/07/2015   ROTATOR CUFF REPAIR W/ DISTAL CLAVICLE EXCISION     SHOULDER SURGERY Right    x2   SKIN CANCER EXCISION     TOTAL KNEE ARTHROPLASTY     VAGINAL HYSTERECTOMY     Family History  Problem Relation Age of Onset   Cancer Mother        lung   Hypertension Sister    Heart attack Sister [phone]  Breast cancer Paternal Aunt     Allergies: Adhesive [tape] Current Outpatient Medications on File Prior to Visit  Medication Sig Dispense Refill   acetaminophen (TYLENOL) 500 MG tablet Take 1-2 tablets (500-1,000 mg total) by mouth every 4 (four) hours as needed for fever. 30 tablet 0   aspirin 81 MG tablet Take 81 mg by mouth daily.     calcium carbonate (OS-CAL) 600 MG TABS tablet Take 600 mg by mouth daily as needed.     carvedilol (COREG) 3.125 MG tablet TAKE 1 TABLET (3.125 MG TOTAL) BY MOUTH 2 (TWO) TIMES DAILY WITH A MEAL. 120 tablet 3   Cholecalciferol (VITAMIN D) 2000 UNITS CAPS Take by mouth daily. Patient is taking 3000 units instead of 2000     citalopram (CELEXA) 10 MG tablet TAKE 1 TABLET (10 MG TOTAL) BY MOUTH DAILY. D/C  EFFEXOR 37.5 90 tablet 3   gabapentin (NEURONTIN) 100 MG capsule TAKE 1 CAPSULE BY MOUTH AT BEDTIME. 90 capsule 3   losartan (COZAAR) 100 MG tablet TAKE 1 TABLET (100 MG TOTAL) BY MOUTH DAILY. D/C 50 MG 90 tablet 3   Omega-3 Fatty Acids (FISH OIL) 1200 MG CAPS Take 1 capsule by mouth 2 (two) times daily.     omeprazole (PRILOSEC) 20 MG capsule Take 1 capsule (20 mg total) by mouth 2 (two) times daily. 180 capsule 0   simvastatin (ZOCOR) 20 MG tablet TAKE 1 TABLET BY MOUTH EVERY DAY AT 6PM 90 tablet 1   vitamin B-12 (CYANOCOBALAMIN) 1000 MCG tablet Take 1,000 mcg by mouth daily.     amLODipine (NORVASC) 10 MG tablet Take 1 tablet (10 mg total) by mouth daily. 90 tablet 3   fenofibrate (TRICOR) 145 MG tablet Take 1 tablet (145 mg total) by  mouth daily. (Patient not taking: Reported on 09/18/2022) 30 tablet 4   LORazepam (ATIVAN) 0.5 MG tablet TAKE 0.5-1 TABLETS (0.25-0.5 MG TOTAL) BY MOUTH DAILY AS NEEDED FOR ANXIETY (Patient not taking: Reported on 09/18/2022) 30 tablet 2   Current Facility-Administered Medications on File Prior to Visit  Medication Dose Route Frequency Provider Last Rate Last Admin   betamethasone acetate-betamethasone sodium phosphate (CELESTONE) injection 3 mg  3 mg Intra-articular Once Edrick Kins, DPM        Social History   Tobacco Use   Smoking status: Never   Smokeless tobacco: Never  Vaping Use   Vaping Use: Never used  Substance Use Topics   Alcohol use: No   Drug use: No    Review of Systems  Constitutional:  Negative for chills and fever.  Respiratory:  Negative for cough.   Cardiovascular:  Negative for chest pain and palpitations.  Gastrointestinal:  Negative for nausea and vomiting.  Neurological:  Positive for numbness.      Objective:    BP 136/78   Pulse 75   Temp (!) 97.5 F (36.4 C) (Oral)   Ht '5\' 1"'$  (1.549 m)   Wt 167 lb 9.6 oz (76 kg)   SpO2 97%   BMI 31.67 kg/m  BP Readings from Last 3 Encounters:  09/18/22 136/78  08/21/22 (!) 128/55  06/30/22 130/82   Wt Readings from Last 3 Encounters:  09/18/22 167 lb 9.6 oz (76 kg)  08/21/22 167 lb (75.8 kg)  06/30/22 167 lb 3.2 oz (75.8 kg)    Physical Exam Vitals reviewed.  Constitutional:      Appearance: She is well-developed.  Eyes:     Conjunctiva/sclera: Conjunctivae normal.  Cardiovascular:     Rate and Rhythm: Normal rate and regular rhythm.     Pulses: Normal pulses.     Heart sounds: Normal heart sounds.     Comments: No LE edema, palpable cords or masses. No erythema or increased warmth. No asymmetry in calf size when compared bilaterally LE hair growth symmetric and present. No discoloration . varicosities noted. LE warm . Right pedal pulse diminished. Left pedal pulse palpable.  Pulmonary:      Effort: Pulmonary effort is normal.     Breath sounds: Normal breath sounds. No wheezing, rhonchi or rales.  Skin:    General: Skin is warm and dry.  Neurological:     Mental Status: She is alert.  Psychiatric:        Speech: Speech normal.        Behavior: Behavior normal.  Thought Content: Thought content normal.        Assessment & Plan:   Problem List Items Addressed This Visit       Cardiovascular and Mediastinum   Essential hypertension    Improved with second blood pressure taken today.  Do think blood pressure has increased somewhat since she has discontinued hydrochlorothiazide due to hyponatremia.  Advised patient to remain compliant with carvedilol 3.125 mg twice daily, amlodipine 10 mg daily, losartan 100 mg daily        Other   B12 deficiency due to diet   Relevant Orders   B12 and Folate Panel   Celiac Disease Ab Screen w/Rfx   IBC + Ferritin   Homocysteine   Intrinsic Factor Antibodies   CBC with Differential/Platelet   Chronic pain of both feet - Primary    Presentation somewhat consistent with restless leg syndrome.  Pending iron stores.  History of B12 deficiency also pending thorough investigation of B12 deficiency to ensure negative celiac, pernicious anemia as dont see done in the past.  Right pedal pulse slightly diminished.  I ordered ultrasound arterial as well. Advised we can increase gabapentin if she would like. I also advised she could trial Tylenol arthritis for suspected arthritic changes. We can pursue xray at follow up      Relevant Orders   B12 and Folate Panel   Celiac Disease Ab Screen w/Rfx   IBC + Ferritin   Homocysteine   Intrinsic Factor Antibodies   CBC with Differential/Platelet   [place] Lower Ext Art Bilat   MM 3D SCREEN BREAST BILATERAL   DG Bone Density   HLD (hyperlipidemia)    Lab Results  Component Value Date   LDLCALC 91 04/17/2022  Chronic, stable.  Continue simvastatin 20 mg daily, fenofibrate 145 mg daily       Other Visit Diagnoses     Prediabetes       Relevant Orders   Comprehensive metabolic panel   Hemoglobin A1c   Screening for osteoporosis       Relevant Orders   DG Bone Density   Encounter for screening mammogram for malignant neoplasm of breast       Relevant Orders   MM 3D SCREEN BREAST BILATERAL   Asymptomatic menopausal state       Relevant Orders   DG Bone Density   Restless leg syndrome       Relevant Orders   IBC + Ferritin        I am having [name] maintain her aspirin, Vitamin D, calcium carbonate, cyanocobalamin, Fish Oil, acetaminophen, amLODipine, simvastatin, carvedilol, fenofibrate, losartan, citalopram, LORazepam, gabapentin, and omeprazole. We will continue to administer betamethasone acetate-betamethasone sodium phosphate.   No orders of the defined types were placed in this encounter.   Return precautions given.   Risks, benefits, and alternatives of the medications and treatment plan prescribed today were discussed, and patient expressed understanding.   Education regarding symptom management and diagnosis given to patient on AVS.  Continue to follow with Burnard Hawthorne, FNP for routine health maintenance.   Rise Paganini and I agreed with plan.   Mable Paris, FNP

## 2022-09-21 ENCOUNTER — Telehealth: Payer: Self-pay

## 2022-09-21 LAB — INTRINSIC FACTOR ANTIBODIES: Intrinsic Factor: NEGATIVE

## 2022-09-21 LAB — HOMOCYSTEINE: Homocysteine: 10 umol/L (ref <10.4)

## 2022-09-21 NOTE — Telephone Encounter (Signed)
Patient states she saw her lab results online and noticed a couple of them were flagged as low and she would like to know what she needs to do.

## 2022-09-22 ENCOUNTER — Other Ambulatory Visit: Payer: Self-pay

## 2022-09-22 DIAGNOSIS — K219 Gastro-esophageal reflux disease without esophagitis: Secondary | ICD-10-CM

## 2022-09-22 LAB — CELIAC DISEASE AB SCREEN W/RFX
Antigliadin Abs, IgA: 3 units (ref 0–19)
IgA/Immunoglobulin A, Serum: 343 mg/dL (ref 64–422)
Transglutaminase IgA: <2 U/mL (ref 0–3)

## 2022-09-22 MED ORDER — OMEPRAZOLE 20 MG PO CPDR: 20.0000 mg | DELAYED_RELEASE_CAPSULE | Freq: Two times a day (BID) | ORAL | 0 refills | Status: DC

## 2022-09-22 NOTE — Telephone Encounter (Signed)
Please call patient and review result note which I described in detail 09/18/2022.  If she has any questions as there was a lot discussed there, please feel free to schedule virtual in person visit to discuss in further detail.  Please be specific if she has further questions for me

## 2022-09-22 NOTE — Telephone Encounter (Signed)
Spoke to pt about her message and she stated that she had not seen it in my chart until last night that's why she had questions, but she fully understands now after reading your result notes.Pt stated that she was going to get Iron pills today and needed refill of Omeprazole. RX sent pt is aware

## 2022-10-01 ENCOUNTER — Other Ambulatory Visit: Payer: Self-pay | Admitting: Family

## 2022-10-01 DIAGNOSIS — Z13820 Encounter for screening for osteoporosis: Secondary | ICD-10-CM

## 2022-10-01 DIAGNOSIS — Z780 Asymptomatic menopausal state: Secondary | ICD-10-CM

## 2022-10-01 DIAGNOSIS — G8929 Other chronic pain: Secondary | ICD-10-CM

## 2022-10-12 ENCOUNTER — Telehealth: Payer: Self-pay

## 2022-10-12 NOTE — Telephone Encounter (Signed)
Patient states Mable Paris, FNP, asked her to call if she has not heard from anyone in a week regarding scheduling an ultrasound for the circulation in her legs and it has been two weeks.

## 2022-10-20 ENCOUNTER — Ambulatory Visit
Admission: RE | Admit: 2022-10-20 | Discharge: 2022-10-20 | Disposition: A | Discharge status: Home / Self Care | Payer: Medicare Other | Source: Ambulatory Visit | Attending: Family | Admitting: Family

## 2022-10-20 ENCOUNTER — Other Ambulatory Visit: Payer: Self-pay | Admitting: Family

## 2022-10-20 ENCOUNTER — Telehealth: Payer: Self-pay

## 2022-10-20 DIAGNOSIS — G8929 Other chronic pain: Secondary | ICD-10-CM | POA: Insufficient documentation

## 2022-10-20 DIAGNOSIS — I70203 Unspecified atherosclerosis of native arteries of extremities, bilateral legs: Secondary | ICD-10-CM | POA: Diagnosis not present

## 2022-10-20 DIAGNOSIS — K219 Gastro-esophageal reflux disease without esophagitis: Secondary | ICD-10-CM

## 2022-10-20 DIAGNOSIS — M79672 Pain in left foot: Secondary | ICD-10-CM | POA: Insufficient documentation

## 2022-10-20 DIAGNOSIS — E782 Mixed hyperlipidemia: Secondary | ICD-10-CM

## 2022-10-20 DIAGNOSIS — M79671 Pain in right foot: Secondary | ICD-10-CM | POA: Diagnosis not present

## 2022-10-20 DIAGNOSIS — R0989 Other specified symptoms and signs involving the circulatory and respiratory systems: Secondary | ICD-10-CM | POA: Diagnosis not present

## 2022-10-20 NOTE — Telephone Encounter (Signed)
Pt called stating she got a msg from [place] about her [place] results but she can not take the medication she told her to continue because she is out of refills.  Pt would like a call back.

## 2022-10-22 NOTE — Telephone Encounter (Signed)
Pt returning call....requesting callback...Marland KitchenMarland Kitchen

## 2022-10-22 NOTE — Telephone Encounter (Signed)
Lvm to call back to office to discuss medication

## 2022-10-22 NOTE — Telephone Encounter (Signed)
Spoke to pt and she stated that she needs refills on Fenofibrate and Omeprazole if she is able to still take it.She doesn't see her Cardiologist until next week though.

## 2022-10-26 MED ORDER — OMEPRAZOLE 20 MG PO CPDR: 20.0000 mg | DELAYED_RELEASE_CAPSULE | Freq: Two times a day (BID) | ORAL | 3 refills | Status: DC

## 2022-10-26 MED ORDER — FENOFIBRATE 145 MG PO TABS: 145.0000 mg | ORAL_TABLET | Freq: Every day | ORAL | 3 refills | Status: DC

## 2022-10-26 NOTE — Addendum Note (Signed)
Addended by: Burnard Hawthorne on: 10/26/2022 01:11 PM   Modules accepted: Orders

## 2022-10-26 NOTE — Telephone Encounter (Signed)
Call pt  I refilled fenofibrate and omeprazole for year supply

## 2022-10-26 NOTE — Telephone Encounter (Signed)
Rxs sent and patient notified

## 2022-10-28 ENCOUNTER — Ambulatory Visit: Payer: Medicare Other | Attending: Nurse Practitioner | Admitting: Nurse Practitioner

## 2022-10-28 ENCOUNTER — Encounter: Payer: Self-pay | Admitting: Nurse Practitioner

## 2022-10-28 VITALS — BP 184/72 | HR 69 | Ht 61.0 in | Wt 168.4 lb

## 2022-10-28 DIAGNOSIS — I10 Essential (primary) hypertension: Secondary | ICD-10-CM | POA: Diagnosis not present

## 2022-10-28 DIAGNOSIS — E782 Mixed hyperlipidemia: Secondary | ICD-10-CM | POA: Insufficient documentation

## 2022-10-28 MED ORDER — CARVEDILOL 6.25 MG PO TABS: 6.2500 mg | ORAL_TABLET | Freq: Two times a day (BID) | ORAL | 0 refills | Status: DC

## 2022-10-28 NOTE — Progress Notes (Signed)
Office Visit    Patient Name: Kristen Powell Date of Encounter: 10/28/2022  Primary Care Provider:  Burnard Hawthorne, FNP Primary Cardiologist:  Kristen Sable, Powell  Chief Complaint    Kristen Powell is a 76 y.o female with a history of hypertension, hyperlipidemia, and anxiety who presents today for a follow-up related to hypertension.   Past Medical History    Past Medical History:  Diagnosis Date   Anxiety    Kristen Powell    Diastolic dysfunction    a. 12/2021 Echo: EF 60-65%, no rwma, GrI DD, nl RV fxn, RVSP 36.75mHg, mild MR.   Hyperlipidemia    Hypertension    Mild Mitral regurgitation    a. 12/2021 Echo: Mild MR.   Psoriasis    younger age   Spinal stenosis    Traumatic rupture of left ear drum    Past Surgical History:  Procedure Laterality Date   ABDOMINAL HYSTERECTOMY  12/14/1978   Partial   BASAL CELL CARCINOMA EXCISION  2006,2008,2011   BUNIONECTOMY     BUNIONECTOMY Bilateral 12/14/1990   COLONOSCOPY WITH PROPOFOL N/A 12/09/2021   Procedure: COLONOSCOPY WITH PROPOFOL;  Surgeon: Kristen Lame Powell;  [facility];  Service: Endoscopy;  Laterality: N/A;   KNEE ARTHROSCOPY     KNEE CLOSED REDUCTION  05/07/2015   Procedure: CLOSED MANIPULATION KNEE;  Surgeon: Kristen Powell;  Location: [facility] ORS;  Service: Orthopedics;;   KNEE SURGERY Bilateral 2005,2006   Repair   MOHS SURGERY     nose bcc, left cheeck Dr. SDelice Leschf/u Q 12/2020   REPLACEMENT TOTAL KNEE Left 04/11/2015   manipulation--05/07/2015   ROTATOR CUFF REPAIR W/ DISTAL CLAVICLE EXCISION     SHOULDER SURGERY Right    x2   SKIN CANCER EXCISION     TOTAL KNEE ARTHROPLASTY     VAGINAL HYSTERECTOMY      Allergies  Allergies  Allergen Reactions   Adhesive [Tape] Rash    redness    History of Present Illness    Kristen Powell a 76y.o female with a history of hypertension, hyperlipidemia, and anxiety.  She established care in 11/2021 with Dr. AGaren Lahfor  uncontrolled hypertension with systolic blood pressures ranging from 140-160s at home despite multiple medications with adjustments.  She had a renal ultrasound in August 2022 that suggested no evidence of renal artery stenosis so she was started on HCTZ in December 2022.  She presented with a systolic murmur on exam, which was evaluated by echocardiogram in January 2023 that suggested an EF of 60-65%, normal LV function, and GR1 DD.    She was last seen in the office in May 2023 and advised provider that HCTZ was discontinued by endocrinology d/t hyponatremia.  Last Na+ was 135 in October 2023 but was previously 129 in January 2023.  According to endocrinology notes, this issue is chronic and dates back to 2016.  Her blood pressure at that visit was mildly elevated (138/60) so patient was advised to monitor pressures at home.  Since last being seen she has been feeling well.  She has been volunteering at her church, which requires her to do a lot more walking.  She denies any additional exercise.  She states that she eats out 1-2 times per week but also eats a lot of processed meals at home.  Her blood pressures at home have been ranging from 130-140s SBP despite taking carvedilol, losartan, and amlodipine.  She admits to some increased stress with  Thanksgiving coming up, her longer hours volunteering at church, and painful new dentures.  She has some swelling in her ankles, which typically worsens as the day goes on and is relieved with compression stockings and elevation.  She denies chest pain, palpitations, dyspnea with exertion, orthopnea, PND, syncope, and early satiety.   Home Medications    Current Outpatient Medications  Medication Sig Dispense Refill   acetaminophen (TYLENOL) 500 MG tablet Take 1-2 tablets (500-1,000 mg total) by mouth every 4 (four) hours as needed for fever. 30 tablet 0   amLODipine (NORVASC) 10 MG tablet Take 1 tablet (10 mg total) by mouth daily. 90 tablet 3   aspirin 81 MG  tablet Take 81 mg by mouth daily.     calcium carbonate (OS-CAL) 600 MG TABS tablet Take 600 mg by mouth daily as needed.     Cholecalciferol (VITAMIN D) 2000 UNITS CAPS Take by mouth daily. Patient is taking 3000 units instead of 2000     citalopram (CELEXA) 10 MG tablet TAKE 1 TABLET (10 MG TOTAL) BY MOUTH DAILY. D/C EFFEXOR 37.5 90 tablet 3   fenofibrate (TRICOR) 145 MG tablet Take 1 tablet (145 mg total) by mouth daily. 90 tablet 3   gabapentin (NEURONTIN) 100 MG capsule TAKE 1 CAPSULE BY MOUTH AT BEDTIME. 90 capsule 3   LORazepam (ATIVAN) 0.5 MG tablet TAKE 0.5-1 TABLETS (0.25-0.5 MG TOTAL) BY MOUTH DAILY AS NEEDED FOR ANXIETY 30 tablet 2   losartan (COZAAR) 100 MG tablet TAKE 1 TABLET (100 MG TOTAL) BY MOUTH DAILY. D/C 50 MG 90 tablet 3   Omega-3 Fatty Acids (FISH OIL) 1200 MG CAPS Take 1 capsule by mouth 2 (two) times daily.     omeprazole (PRILOSEC) 20 MG capsule Take 1 capsule (20 mg total) by mouth 2 (two) times daily. 180 capsule 3   simvastatin (ZOCOR) 20 MG tablet TAKE 1 TABLET BY MOUTH EVERY DAY AT 6PM 90 tablet 1   vitamin B-12 (CYANOCOBALAMIN) 1000 MCG tablet Take 1,000 mcg by mouth daily.     carvedilol (COREG) 6.25 MG tablet Take 1 tablet (6.25 mg total) by mouth 2 (two) times daily with a meal. 180 tablet 0   Current Facility-Administered Medications  Medication Dose Route Frequency Provider Last Rate Last Admin   betamethasone acetate-betamethasone sodium phosphate (CELESTONE) injection 3 mg  3 mg Intra-articular Once Kristen Powell, DPM         Review of Systems    Has some swelling in her legs, that typically worsens as the day goes on.  She denies chest pain, palpitations, dyspnea with exertion, orthopnea, PND, syncope, and early satiety. All other systems reviewed and are otherwise negative except as noted above.    Physical Exam    VS:  BP (!) 160/70 (BP Location: Left Arm, Patient Position: Sitting, Cuff Size: Normal)   Pulse 69   Ht '5\' 1"'$  (1.549 m)   Wt 168 lb  6 oz (76.4 kg)   SpO2 98%   BMI 31.81 kg/m  , BMI Body mass index is 31.81 kg/m.     Vitals:   10/28/22 0905 10/28/22 1257  BP: (!) 160/70 (!) 184/72  Pulse: 69   SpO2: 98%     GEN: Well nourished, well developed, in no acute distress. HEENT: normal. Neck: Supple, no JVD, carotid bruits, or masses. Cardiac: RRR, 2/6 systolic murmur heard throughout.  No rubs, or gallops. No clubbing or cyanosis.  Mild swelling to bilateral ankles. Radials/PT 2+ and equal bilaterally.  Respiratory:  Respirations regular and unlabored, clear to auscultation bilaterally. GI: Soft, nontender, nondistended, BS + x 4. MS: no deformity or atrophy. Skin: warm and dry, no rash. Neuro:  Strength and sensation are intact. Psych: Normal affect.  Accessory Clinical Findings    ECG personally reviewed by me today - normal sinus rhythm, 69, prob LVH, baseline artifact- no acute changes.  Lab Results  Component Value Date   WBC 5.8 09/18/2022   HGB 11.3 (L) 09/18/2022   HCT 33.7 (L) 09/18/2022   MCV 96.3 09/18/2022   PLT 258.0 09/18/2022   Lab Results  Component Value Date   CREATININE 0.88 09/18/2022   BUN 16 09/18/2022   NA 135 09/18/2022   K 4.2 09/18/2022   CL 101 09/18/2022   CO2 27 09/18/2022   Lab Results  Component Value Date   ALT 20 09/18/2022   AST 19 09/18/2022   ALKPHOS 68 09/18/2022   BILITOT 0.4 09/18/2022   Lab Results  Component Value Date   CHOL 169 04/17/2022   HDL 55 04/17/2022   LDLCALC 91 04/17/2022   LDLDIRECT 123.0 04/29/2021   TRIG 114 04/17/2022   CHOLHDL 3.1 04/17/2022    Lab Results  Component Value Date   HGBA1C 6.0 09/18/2022    Assessment & Plan    1.  Essential hypertension: Blood pressure elevated today.  160/70 at initial intake, 184/72 at recheck, and 182/64 at final check despite taking blood pressure medications this morning.  Patient advised to cut back on salt intake and processed foods.  Encouraged to eat a low sodium diet and avoid adding  excess salt to meals.  Also advised to take blood pressures at home and let office know if blood pressures are consistently greater than 130s SBP.  I suspect that she runs higher than she is aware.  We will increase her carvedilol dose to 6.25 mg BID starting today.    2. Hyperlipidemia: LDL 91 and triglycerides 114 on 04/17/22.  She has been started on fenofibrate by PCP and we will continue statin.   3. Systolic Murmur:  noted on exam previously and again today.  Only mild MR on echo 12/2021.  4.  Disposition: She will send [place] blood pressure log via MyChart in 2 weeks.  Follow-up in office in 3 months or sooner if necessary.    Murray Hodgkins, NP 10/28/2022, 12:54 PM

## 2022-10-28 NOTE — Patient Instructions (Signed)
Medication Instructions:   INCREASE Carvedilol to 6.'25mg'$  - tale one tablet by mouth twice a day.   *If you need a refill on your cardiac medications before your next appointment, please call your pharmacy*   Lab Work:  None Ordered  If you have labs (blood work) drawn today and your tests are completely normal, you will receive your results only by: [place] (if you have MyChart) OR A paper copy in the mail If you have any lab test that is abnormal or we need to change your treatment, we will call you to review the results.   Testing/Procedures:  None Ordered   Follow-Up: At [facility], you and your health needs are our priority.  As part of our continuing mission to provide you with exceptional heart care, we have created designated Provider Care Teams.  These Care Teams include your primary Cardiologist (physician) and Advanced Practice Providers (APPs -  Physician Assistants and Nurse Practitioners) who all work together to provide you with the care you need, when you need it.  We recommend signing up for the patient portal called "MyChart".  Sign up information is provided on this After Visit Summary.  MyChart is used to connect with patients for Virtual Visits (Telemedicine).  Patients are able to view lab/test results, encounter notes, upcoming appointments, etc.  Non-urgent messages can be sent to your provider as well.   To learn more about what you can do with MyChart, go to [email or web address].    Your next appointment:   3 - 4 month(s)  The format for your next appointment:   In Person  Provider:   You may see Kate Sable, MD or one of the following Advanced Practice Providers on your designated Care Team:   Murray Hodgkins, NP Christell Faith, PA-C Cadence Kathlen Mody, PA-C Gerrie Nordmann, NP

## 2022-11-02 ENCOUNTER — Telehealth: Payer: Self-pay

## 2022-11-02 NOTE — Telephone Encounter (Signed)
Patient states she needs a refill for her omeprazole (PRILOSEC) 20 MG capsule.  *Patient states her preferred pharmacy is [facility] on [address] (not in [employer])  Patient asked about her referral to a vascular doctor.  I let her know that the referral has been sent to [facility] and I gave her their phone number so she can call and schedule her appointment.

## 2022-11-12 ENCOUNTER — Other Ambulatory Visit: Payer: Self-pay

## 2022-11-12 ENCOUNTER — Telehealth: Payer: Self-pay | Admitting: Family

## 2022-11-12 NOTE — Telephone Encounter (Signed)
Pt called wanting a refill on : simvastatin (ZOCOR) 20 MG tablet    The medication was sent in previously by:   Flinchum, Kelby Aline, FNP   I didn't see any cholesterol labs ordered by PCP but a previous provider. Med has been pended.

## 2022-11-12 NOTE — Telephone Encounter (Signed)
Refill pended and routed to pcp

## 2022-11-12 NOTE — Telephone Encounter (Signed)
Pt need a refill on simvastatin sent to [facility]

## 2022-11-13 MED ORDER — SIMVASTATIN 20 MG PO TABS: 20.0000 mg | ORAL_TABLET | Freq: Every day | ORAL | 3 refills | Status: DC

## 2022-11-19 ENCOUNTER — Encounter: Payer: Self-pay | Admitting: Family

## 2022-12-21 NOTE — Progress Notes (Unsigned)
Name: Kristen Powell  MRN/ DOB: [id number], November 10, 1946    Age/ Sex: 77 y.o., female    PCP: Burnard Hawthorne, FNP   Reason for Endocrinology Evaluation: Hyponatremia      Date of Initial Endocrinology Evaluation: 02/04/2022    HPI: Kristen Powell is a 77 y.o. female with a past medical history of HTN, GERD and DJD. The patient presented for initial endocrinology clinic visit on 02/04/2022 for consultative assistance with her Hyponatremia .   Pt has been noted with intermittent hyponatremia since 2016 with a nadir of 128 mmol/L in 2016.    The pt is on HCTZ for HTN for 6 weeks due to LE edema per pt. Echo showed MR, EF 60-65%  She was drinking 4 bottles of water a day plus 3-4 cups of coffee, occasional juice , on her initial visit she was advised to reduce her liquid intake by 50% and avoid juice, we also stopped her HCTZ with serum sodium of 129 mmol/L  No prior hx of tobacco use    SUBJECTIVE:    Today (12/22/22):  Kristen Powell is here for a follow up on hyponatremia.    She continues to limit oral liquid intake  She wears LE compression socks which as helped tremendously with her edema  Denies dizziness  Denies nausea  Denies headaches  She is on " new salt" which is a salt substitute    HISTORY:  Past Medical History:  Past Medical History:  Diagnosis Date   Anxiety    [id number]    Diastolic dysfunction    a. 12/2021 Echo: EF 60-65%, no rwma, GrI DD, nl RV fxn, RVSP 36.37mHg, mild MR.   Hyperlipidemia    Hypertension    Mild Mitral regurgitation    a. 12/2021 Echo: Mild MR.   Psoriasis    younger age   Spinal stenosis    Traumatic rupture of left ear drum    Past Surgical History:  Past Surgical History:  Procedure Laterality Date   ABDOMINAL HYSTERECTOMY  12/14/1978   Partial   BASAL CELL CARCINOMA EXCISION  2006,2008,2011   BUNIONECTOMY     BUNIONECTOMY Bilateral 12/14/1990   COLONOSCOPY WITH PROPOFOL N/A 12/09/2021   Procedure:  COLONOSCOPY WITH PROPOFOL;  Surgeon: WLucilla Lame MD;  [facility];  Service: Endoscopy;  Laterality: N/A;   KNEE ARTHROSCOPY     KNEE CLOSED REDUCTION  05/07/2015   Procedure: CLOSED MANIPULATION KNEE;  Surgeon: MHessie Knows MD;  Location: [facility] ORS;  Service: Orthopedics;;   KNEE SURGERY Bilateral 2005,2006   Repair   MOHS SURGERY     nose bcc, left cheeck Dr. SDelice Leschf/u Q 12/2020   REPLACEMENT TOTAL KNEE Left 04/11/2015   manipulation--05/07/2015   ROTATOR CUFF REPAIR W/ DISTAL CLAVICLE EXCISION     SHOULDER SURGERY Right    x2   SKIN CANCER EXCISION     TOTAL KNEE ARTHROPLASTY     VAGINAL HYSTERECTOMY      Social History:  reports that she has never smoked. She has never used smokeless tobacco. She reports that she does not drink alcohol and does not use drugs. Family History: family history includes Breast cancer in her paternal aunt; Cancer in her mother; Heart attack (age of onset: 653 in her sister; Hypertension in her sister.   HOME MEDICATIONS: Allergies as of 12/22/2022       Reactions   Adhesive [tape] Rash   redness        Medication  List        Accurate as of December 22, 2022  8:31 AM. If you have any questions, ask your nurse or doctor.          acetaminophen 500 MG tablet Commonly known as: TYLENOL Take 1-2 tablets (500-1,000 mg total) by mouth every 4 (four) hours as needed for fever.   amLODipine 10 MG tablet Commonly known as: NORVASC Take 1 tablet (10 mg total) by mouth daily.   aspirin 81 MG tablet Take 81 mg by mouth daily.   calcium carbonate 600 MG Tabs tablet Commonly known as: OS-CAL Take 600 mg by mouth daily as needed.   carvedilol 6.25 MG tablet Commonly known as: COREG Take 1 tablet (6.25 mg total) by mouth 2 (two) times daily with a meal.   citalopram 10 MG tablet Commonly known as: CELEXA TAKE 1 TABLET (10 MG TOTAL) BY MOUTH DAILY. D/C EFFEXOR 37.5   cyanocobalamin 1000 MCG tablet Commonly known as: VITAMIN  B12 Take 1,000 mcg by mouth daily.   fenofibrate 145 MG tablet Commonly known as: Tricor Take 1 tablet (145 mg total) by mouth daily.   Fish Oil 1200 MG Caps Take 1 capsule by mouth 2 (two) times daily.   gabapentin 100 MG capsule Commonly known as: NEURONTIN TAKE 1 CAPSULE BY MOUTH AT BEDTIME.   LORazepam 0.5 MG tablet Commonly known as: ATIVAN TAKE 0.5-1 TABLETS (0.25-0.5 MG TOTAL) BY MOUTH DAILY AS NEEDED FOR ANXIETY   losartan 100 MG tablet Commonly known as: COZAAR TAKE 1 TABLET (100 MG TOTAL) BY MOUTH DAILY. D/C 50 MG   omeprazole 20 MG capsule Commonly known as: PRILOSEC Take 1 capsule (20 mg total) by mouth 2 (two) times daily.   simvastatin 20 MG tablet Commonly known as: ZOCOR Take 1 tablet (20 mg total) by mouth daily. Take in evening   Vitamin D 50 MCG (2000 UT) Caps Take by mouth daily. Patient is taking 3000 units instead of 2000          REVIEW OF SYSTEMS: A comprehensive ROS was conducted with the patient     OBJECTIVE:  VS: BP 134/82 (BP Location: Left Arm, Patient Position: Sitting, Cuff Size: Large)   Pulse 74   Ht '5\' 1"'$  (1.549 m)   Wt 165 lb (74.8 kg)   SpO2 96%   BMI 31.18 kg/m    Wt Readings from Last 3 Encounters:  12/22/22 165 lb (74.8 kg)  10/28/22 168 lb 6 oz (76.4 kg)  09/18/22 167 lb 9.6 oz (76 kg)     EXAM: General: Pt appears well and is in NAD  Eyes: External eye exam normal without stare, lid lag or exophthalmos.   Neck: General: Supple without adenopathy. Thyroid: Thyroid size normal.  No goiter or nodules appreciated.   Lungs: Clear with good BS bilat   Heart: Auscultation: RRR.+ systolic murmur   Extremities:  BL LE: Trace  pretibial edema around the ankles and feet   Mental Status: Judgment, insight: Intact Orientation: Oriented to time, place, and person Mood and affect: No depression, anxiety, or agitation     DATA REVIEWED: ****   Latest Reference Range & Units 02/26/22 09:57  Sodium, Urine 28 - 272  mmol/L 92    Latest Reference Range & Units 02/26/22 09:57  Osmolality, Urine 50 - 1,200 mOsm/kg 346    Latest Reference Range & Units 02/26/22 09:57  Osmolality 278 - 305 mOsm/kg 287    Latest Reference Range & Units 02/26/22 09:57  Sodium 135 - 145 mEq/L 132 (L)  Potassium 3.5 - 5.1 mEq/L 4.4  Chloride 96 - 112 mEq/L 99  CO2 19 - 32 mEq/L 26  Glucose 70 - 99 mg/dL 85  BUN 6 - 23 mg/dL 19  Creatinine 0.40 - 1.20 mg/dL 1.14  Calcium 8.4 - 10.5 mg/dL 9.5  GFR >60.00 mL/min 46.89 (L)   ASSESSMENT/PLAN/RECOMMENDATIONS:   Hyponatremia    - Pt is asymptomatic due to chronicity  -Her sodium has improved since the discontinuation of HCTZ  -Her hyponatremia is due to low effective arterial blood volume which is evidenced by peripheral edema, the cause of this is most likely due to diastolic dysfunction . Low effective arterial blood volume will cause release of ADH.  -So far she has been responded well to fluid restriction -Serum sodium ****.  Which is at goal of serum sodium >130    NO further endocrinology follow up is needed as the pt does NOT have SIADH , will defer further fluid overload management to cardiology      Signed electronically by: Mack Guise, MD  [facility] End[facility]  [facility] [address] [place], [place] [address] Phone: [phone] FAX: [phone]   CC: Burnard Hawthorne, FNP [address] [place] [address] Phone: [phone] Fax: [phone]   Return to Endocrinology clinic as below: Future Appointments  Date Time Provider [place]  12/29/2022  2:15 PM [facility] VASC 3 [facility] None  12/29/2022  3:15 PM Kris Hartmann, NP [facility] None  01/08/2023  8:00 AM [facility] MM 3 [facility]-BCGMM [facility]  02/19/2023  9:40 AM Kate Sable, MD [facility] None  03/12/2023  1:30 PM [facility] DX DEXA 1 GI-BCGDG [facility]  03/22/2023  8:30 AM Arnett, Yvetta Coder, FNP [facility]

## 2022-12-22 ENCOUNTER — Encounter: Payer: Self-pay | Admitting: Internal Medicine

## 2022-12-22 ENCOUNTER — Ambulatory Visit (INDEPENDENT_AMBULATORY_CARE_PROVIDER_SITE_OTHER): Payer: Medicare Other | Admitting: Internal Medicine

## 2022-12-22 VITALS — BP 134/82 | HR 74 | Ht 61.0 in | Wt 165.0 lb

## 2022-12-22 DIAGNOSIS — E871 Hypo-osmolality and hyponatremia: Secondary | ICD-10-CM

## 2022-12-22 LAB — BASIC METABOLIC PANEL
BUN: 25 mg/dL — ABNORMAL HIGH (ref 6–23)
CO2: 24 mEq/L (ref 19–32)
Calcium: 9.2 mg/dL (ref 8.4–10.5)
Chloride: 99 mEq/L (ref 96–112)
Creatinine, Ser: 1.07 mg/dL (ref 0.40–1.20)
GFR: 50.31 mL/min — ABNORMAL LOW (ref >60.00)
Glucose, Bld: 90 mg/dL (ref 70–99)
Potassium: 4.8 mEq/L (ref 3.5–5.1)
Sodium: 134 mEq/L — ABNORMAL LOW (ref 135–145)

## 2022-12-23 ENCOUNTER — Other Ambulatory Visit (INDEPENDENT_AMBULATORY_CARE_PROVIDER_SITE_OTHER): Payer: Self-pay | Admitting: Nurse Practitioner

## 2022-12-23 DIAGNOSIS — I739 Peripheral vascular disease, unspecified: Secondary | ICD-10-CM

## 2022-12-24 ENCOUNTER — Other Ambulatory Visit: Payer: Self-pay | Admitting: Family

## 2022-12-24 DIAGNOSIS — Z85828 Personal history of other malignant neoplasm of skin: Secondary | ICD-10-CM | POA: Diagnosis not present

## 2022-12-24 DIAGNOSIS — D225 Melanocytic nevi of trunk: Secondary | ICD-10-CM | POA: Diagnosis not present

## 2022-12-24 DIAGNOSIS — Z08 Encounter for follow-up examination after completed treatment for malignant neoplasm: Secondary | ICD-10-CM | POA: Diagnosis not present

## 2022-12-24 DIAGNOSIS — L821 Other seborrheic keratosis: Secondary | ICD-10-CM | POA: Diagnosis not present

## 2022-12-24 DIAGNOSIS — L608 Other nail disorders: Secondary | ICD-10-CM | POA: Diagnosis not present

## 2022-12-24 DIAGNOSIS — L814 Other melanin hyperpigmentation: Secondary | ICD-10-CM | POA: Diagnosis not present

## 2022-12-24 DIAGNOSIS — I10 Essential (primary) hypertension: Secondary | ICD-10-CM

## 2022-12-29 ENCOUNTER — Ambulatory Visit (INDEPENDENT_AMBULATORY_CARE_PROVIDER_SITE_OTHER): Payer: Medicare Other

## 2022-12-29 ENCOUNTER — Encounter (INDEPENDENT_AMBULATORY_CARE_PROVIDER_SITE_OTHER): Payer: Self-pay | Admitting: Nurse Practitioner

## 2022-12-29 ENCOUNTER — Ambulatory Visit (INDEPENDENT_AMBULATORY_CARE_PROVIDER_SITE_OTHER): Payer: Medicare Other | Admitting: Nurse Practitioner

## 2022-12-29 VITALS — BP 158/67 | HR 73 | Ht 61.0 in | Wt 165.0 lb

## 2022-12-29 DIAGNOSIS — M7989 Other specified soft tissue disorders: Secondary | ICD-10-CM | POA: Diagnosis not present

## 2022-12-29 DIAGNOSIS — I10 Essential (primary) hypertension: Secondary | ICD-10-CM | POA: Diagnosis not present

## 2022-12-29 DIAGNOSIS — I739 Peripheral vascular disease, unspecified: Secondary | ICD-10-CM

## 2022-12-29 DIAGNOSIS — E782 Mixed hyperlipidemia: Secondary | ICD-10-CM | POA: Diagnosis not present

## 2022-12-29 LAB — VAS US ABI WITH/WO TBI
Left ABI: 0.86
Right ABI: 0.76

## 2022-12-30 ENCOUNTER — Encounter (INDEPENDENT_AMBULATORY_CARE_PROVIDER_SITE_OTHER): Payer: Self-pay | Admitting: Nurse Practitioner

## 2022-12-30 NOTE — Progress Notes (Signed)
Subjective:    Patient ID: Kristen Powell, female    DOB: 01-20-1946, 77 y.o.   MRN: [id number] Chief Complaint  Patient presents with   New Patient (Initial Visit)    Np consult Chronic pain of both feet with ABI, referred by     Neosha Switalski is a 77 year old female who presents today as a referral for possible peripheral arterial disease given foot pain.  The patient has a significant history of multiple foot surgeries as well as injuries that involve rolled her ankle area.  She also has a history of knee surgeries.  She does endorse having some mild claudication-like symptoms.  Typically the symptoms are worse when she is going up stairs.  She also has some restless leg syndrome for which she takes gabapentin.  Currently there are no open wounds or ulcerations.  There is no description of rest pain.  The patient has ABI 0.76 on the right and 0.86 on the left.  There are monophasic tibial artery waveforms on the right with monophasic/biphasic on the right.  Slightly dampened toe waveforms bilaterally.    Review of Systems  Cardiovascular:  Positive for leg swelling.  Musculoskeletal:  Positive for arthralgias and joint swelling.  All other systems reviewed and are negative.      Objective:   Physical Exam Vitals reviewed.  HENT:     Head: Normocephalic.  Cardiovascular:     Rate and Rhythm: Normal rate.     Pulses:          Dorsalis pedis pulses are detected w/ Doppler on the right side and detected w/ Doppler on the left side.       Posterior tibial pulses are detected w/ Doppler on the right side and detected w/ Doppler on the left side.  Pulmonary:     Effort: Pulmonary effort is normal.  Skin:    General: Skin is warm and dry.  Neurological:     Mental Status: She is alert and oriented to person, place, and time.  Psychiatric:        Mood and Affect: Mood normal.        Behavior: Behavior normal.        Thought Content: Thought content normal.        Judgment:  Judgment normal.     BP (!) 158/67   Pulse 73   Ht '5\' 1"'$  (1.549 m)   Wt 165 lb (74.8 kg)   BMI 31.18 kg/m   Past Medical History:  Diagnosis Date   Anxiety    [id number]    Diastolic dysfunction    a. 12/2021 Echo: EF 60-65%, no rwma, GrI DD, nl RV fxn, RVSP 36.26mHg, mild MR.   Hyperlipidemia    Hypertension    Mild Mitral regurgitation    a. 12/2021 Echo: Mild MR.   Psoriasis    younger age   Spinal stenosis    Traumatic rupture of left ear drum     Social History   Socioeconomic History   Marital status: Widowed    Spouse name: Not on file   Number of children: Not on file   Years of education: Not on file   Highest education level: Not on file  Occupational History   Not on file  Tobacco Use   Smoking status: Never   Smokeless tobacco: Never  Vaping Use   Vaping Use: Never used  Substance and Sexual Activity   Alcohol use: No   Drug use: No   Sexual  activity: Never  Other Topics Concern   Not on file  Social History Narrative   Widowed x 12 years as of 06/2021    3 sisters and 1 brother    Has children son age 22, daughter 66, son 73 as of 06/2021   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      2 great grandchildren         Social Determinants of Health   Financial Resource Strain: Low Risk  (08/21/2022)   Overall Financial Resource Strain (CARDIA)    Difficulty of Paying Living Expenses: Not hard at all  Food Insecurity: No Food Insecurity (08/21/2022)   Hunger Vital Sign    Worried About Running Out of Food in the Last Year: Never true    Ran Out of Food in the Last Year: Never true  Transportation Needs: No Transportation Needs (08/21/2022)   PRAPARE - Hydrologist (Medical): No    Lack of Transportation (Non-Medical): No  Physical Activity: Sufficiently Active (08/21/2022)   Exercise Vital Sign    Days of Exercise per Week: 5 days    Minutes of Exercise per Session: 30 min  Stress: No Stress Concern Present (08/21/2022)    [place]    Feeling of Stress : Not at all  Social Connections: Unknown (08/21/2022)   Social Connection and Isolation Panel [NHANES]    Frequency of Communication with Friends and Family: More than three times a week    Frequency of Social Gatherings with Friends and Family: More than three times a week    Attends Religious Services: More than 4 times per year    Active Member of Clubs or Organizations: Not on file    Attends Archivist Meetings: More than 4 times per year    Marital Status: Not on file  Intimate Partner Violence: Not At Risk (08/21/2022)   Humiliation, Afraid, Rape, and Kick questionnaire    Fear of Current or Ex-Partner: No    Emotionally Abused: No    Physically Abused: No    Sexually Abused: No    Past Surgical History:  Procedure Laterality Date   ABDOMINAL HYSTERECTOMY  12/14/1978   Partial   BASAL CELL CARCINOMA EXCISION  2006,2008,2011   BUNIONECTOMY     BUNIONECTOMY Bilateral 12/14/1990   COLONOSCOPY WITH PROPOFOL N/A 12/09/2021   Procedure: COLONOSCOPY WITH PROPOFOL;  Surgeon: Lucilla Lame, MD;  Location: [facility];  Service: Endoscopy;  Laterality: N/A;   KNEE ARTHROSCOPY     KNEE CLOSED REDUCTION  05/07/2015   Procedure: CLOSED MANIPULATION KNEE;  Surgeon: Hessie Knows, MD;  Location: [facility] ORS;  Service: Orthopedics;;   KNEE SURGERY Bilateral 2005,2006   Repair   MOHS SURGERY     nose bcc, left cheeck Dr. Delice Lesch f/u Q 12/2020   REPLACEMENT TOTAL KNEE Left 04/11/2015   manipulation--05/07/2015   ROTATOR CUFF REPAIR W/ DISTAL CLAVICLE EXCISION     SHOULDER SURGERY Right    x2   SKIN CANCER EXCISION     TOTAL KNEE ARTHROPLASTY     VAGINAL HYSTERECTOMY      Family History  Problem Relation Age of Onset   Cancer Mother        lung   Hypertension Sister    Heart attack Sister 51   Breast cancer Paternal Aunt     Allergies  Allergen Reactions   Hydrochlorothiazide  Other (See Comments)    Hyponatremia  Adhesive [Tape] Rash    redness       Latest Ref Rng & Units 09/18/2022    9:20 AM 04/29/2021    9:40 AM 08/08/2020    9:09 AM  CBC  WBC 4.0 - 10.5 K/uL 5.8  6.6  6.9   Hemoglobin 12.0 - 15.0 g/dL 11.3  13.0  12.3   Hematocrit 36.0 - 46.0 % 33.7  37.5  35.8   Platelets 150.0 - 400.0 K/uL 258.0  225.0  204.0       CMP     Component Value Date/Time   NA 134 (L) 12/22/2022 0854   NA 129 (L) 01/05/2022 0913   NA 130 (L) 04/12/2015 0501   K 4.8 12/22/2022 0854   K 4.3 04/12/2015 0501   CL 99 12/22/2022 0854   CL 98 (L) 04/12/2015 0501   CO2 24 12/22/2022 0854   CO2 25 04/12/2015 0501   GLUCOSE 90 12/22/2022 0854   GLUCOSE 102 (H) 04/12/2015 0501   BUN 25 (H) 12/22/2022 0854   BUN 17 01/05/2022 0913   BUN 14 04/12/2015 0501   CREATININE 1.07 12/22/2022 0854   CREATININE 0.79 04/12/2015 0501   CALCIUM 9.2 12/22/2022 0854   CALCIUM 8.0 (L) 04/12/2015 0501   PROT 7.0 09/18/2022 0920   ALBUMIN 4.1 09/18/2022 0920   AST 19 09/18/2022 0920   ALT 20 09/18/2022 0920   ALKPHOS 68 09/18/2022 0920   BILITOT 0.4 09/18/2022 0920   GFRNONAA >60 04/12/2015 0501   GFRAA >60 04/12/2015 0501     VAS [place] ABI WITH/WO TBI  Result Date: 12/29/2022  LOWER EXTREMITY DOPPLER STUDY Patient Name:  Ariadna Setter  Date of Exam:   12/29/2022 Medical Rec #: [id number]           Accession #:    [id number] Date of Birth: Jul 03, 1946            Patient Gender: F Patient Age:   63 years Exam [facility] Procedure:      VAS [place] ABI WITH/WO TBI Referring Phys: Arna Medici Kynzleigh Bandel --------------------------------------------------------------------------------  High Risk Factors: Hypertension, hyperlipidemia, no history of smoking.  Comparison Study: Prior duplex at an outside facility on 10/20/2022 suggested                   mild to moderate distal right SFA stenosis and bilateral                   tibial run off occlusive disease. Performing  Technologist: Delorise Shiner RVT  Examination Guidelines: A complete evaluation includes at minimum, Doppler waveform signals and systolic blood pressure reading at the level of bilateral brachial, anterior tibial, and posterior tibial arteries, when vessel segments are accessible. Bilateral testing is considered an integral part of a complete examination. Photoelectric Plethysmograph (PPG) waveforms and toe systolic pressure readings are included as required and additional duplex testing as needed. Limited examinations for reoccurring indications may be performed as noted.  ABI Findings: +---------+------------------+-----+----------+--------+ Right    Rt Pressure (mmHg)IndexWaveform  Comment  +---------+------------------+-----+----------+--------+ Brachial 187                                       +---------+------------------+-----+----------+--------+ PTA      73                0.39 monophasic         +---------+------------------+-----+----------+--------+  DP       143               0.76 monophasic         +---------+------------------+-----+----------+--------+ Great Toe71                0.38                    +---------+------------------+-----+----------+--------+ +---------+------------------+-----+----------+-------+ Left     Lt Pressure (mmHg)IndexWaveform  Comment +---------+------------------+-----+----------+-------+ Brachial 186                                      +---------+------------------+-----+----------+-------+ PTA      125               0.67 monophasic        +---------+------------------+-----+----------+-------+ DP       160               0.86 biphasic          +---------+------------------+-----+----------+-------+ Great Toe74                0.40                   +---------+------------------+-----+----------+-------+ +-------+-----------+-----------+------------+------------+ ABI/TBIToday's ABIToday's TBIPrevious  ABIPrevious TBI +-------+-----------+-----------+------------+------------+ Right  0.76       0.38                                +-------+-----------+-----------+------------+------------+ Left   0.86       0.40                                +-------+-----------+-----------+------------+------------+  Summary: Right: Resting right ankle-brachial index indicates moderate right lower extremity arterial disease. The right toe-brachial index is abnormal. Left: Resting left ankle-brachial index indicates mild left lower extremity arterial disease. The left toe-brachial index is abnormal. *See table(s) above for measurements and observations.  Electronically signed by Leotis Pain MD on 12/29/2022 at 4:34:19 PM.    Final        Assessment & Plan:   1. Peripheral arterial disease ([place]) I had a long discussion with the patient regarding her peripheral arterial disease.  Currently she has some mild claudication-like symptoms but nothing significant.  The pain in the patient's feet I suspect is largely due to her multiple foot surgeries and injuries.  There is also some neuropathic component as well.  Based on this I do not recommend any intervention as it likely would not provide her significant benefit as of yet.  I do recommend close follow-up however.  I discussed with the patient the signs and symptoms that would indicate the need for sooner follow-up such as worsening claudication, rest pain or the development of open wounds or ulcerations.  Barring any of this we will plan on having the patient return in 6 months with noninvasive studies. - VAS [place] ABI WITH/WO TBI  2. Essential hypertension Continue antihypertensive medications as already ordered, these medications have been reviewed and there are no changes at this time.  3. Mixed hyperlipidemia Continue statin as ordered and reviewed, no changes at this time   4. Leg swelling The patient currently does use medical grade compression  stockings to help her leg swelling however she does not wear them as consistently  due to the fit.  She does plan on getting new ones that she can wear on a more significant basis.  She is advised to wear them first thing in the morning take them off before bedtime and elevate her lower extremities as possible.  Current Outpatient Medications on File Prior to Visit  Medication Sig Dispense Refill   acetaminophen (TYLENOL) 500 MG tablet Take 1-2 tablets (500-1,000 mg total) by mouth every 4 (four) hours as needed for fever. 30 tablet 0   amLODipine (NORVASC) 10 MG tablet Take 1 tablet (10 mg total) by mouth daily. 90 tablet 3   aspirin 81 MG tablet Take 81 mg by mouth daily.     calcium carbonate (OS-CAL) 600 MG TABS tablet Take 600 mg by mouth daily as needed.     carvedilol (COREG) 6.25 MG tablet Take 1 tablet (6.25 mg total) by mouth 2 (two) times daily with a meal. 180 tablet 0   Cholecalciferol (VITAMIN D) 2000 UNITS CAPS Take by mouth daily. Patient is taking 3000 units instead of 2000     citalopram (CELEXA) 10 MG tablet TAKE 1 TABLET (10 MG TOTAL) BY MOUTH DAILY. D/C EFFEXOR 37.5 90 tablet 3   fenofibrate (TRICOR) 145 MG tablet Take 1 tablet (145 mg total) by mouth daily. 90 tablet 3   gabapentin (NEURONTIN) 100 MG capsule TAKE 1 CAPSULE BY MOUTH AT BEDTIME. 90 capsule 3   LORazepam (ATIVAN) 0.5 MG tablet TAKE 0.5-1 TABLETS (0.25-0.5 MG TOTAL) BY MOUTH DAILY AS NEEDED FOR ANXIETY 30 tablet 2   losartan (COZAAR) 100 MG tablet TAKE 1 TABLET (100 MG TOTAL) BY MOUTH DAILY. D/C 50 MG 90 tablet 3   Omega-3 Fatty Acids (FISH OIL) 1200 MG CAPS Take 1 capsule by mouth 2 (two) times daily.     omeprazole (PRILOSEC) 20 MG capsule Take 1 capsule (20 mg total) by mouth 2 (two) times daily. 180 capsule 3   simvastatin (ZOCOR) 20 MG tablet Take 1 tablet (20 mg total) by mouth daily. Take in evening 90 tablet 3   vitamin B-12 (CYANOCOBALAMIN) 1000 MCG tablet Take 1,000 mcg by mouth daily.     Current  Facility-Administered Medications on File Prior to Visit  Medication Dose Route Frequency Provider Last Rate Last Admin   betamethasone acetate-betamethasone sodium phosphate (CELESTONE) injection 3 mg  3 mg Intra-articular Once Edrick Kins, DPM        There are no Patient Instructions on file for this visit. No follow-ups on file.   Kris Hartmann, NP

## 2023-01-08 ENCOUNTER — Ambulatory Visit
Admission: RE | Admit: 2023-01-08 | Discharge: 2023-01-08 | Disposition: A | Discharge status: Home / Self Care | Payer: Medicare Other | Source: Ambulatory Visit | Attending: Family | Admitting: Family

## 2023-01-08 DIAGNOSIS — G8929 Other chronic pain: Secondary | ICD-10-CM

## 2023-01-08 DIAGNOSIS — Z1231 Encounter for screening mammogram for malignant neoplasm of breast: Secondary | ICD-10-CM

## 2023-01-21 ENCOUNTER — Other Ambulatory Visit: Payer: Self-pay | Admitting: *Deleted

## 2023-01-21 MED ORDER — AMLODIPINE BESYLATE 10 MG PO TABS: 10.0000 mg | ORAL_TABLET | Freq: Every day | ORAL | 0 refills | Status: DC

## 2023-01-26 ENCOUNTER — Other Ambulatory Visit: Payer: Self-pay | Admitting: Nurse Practitioner

## 2023-01-26 DIAGNOSIS — I10 Essential (primary) hypertension: Secondary | ICD-10-CM

## 2023-02-19 ENCOUNTER — Encounter: Payer: Self-pay | Admitting: Cardiology

## 2023-02-19 ENCOUNTER — Ambulatory Visit: Payer: Medicare Other | Attending: Cardiology | Admitting: Cardiology

## 2023-02-19 ENCOUNTER — Other Ambulatory Visit
Admission: RE | Admit: 2023-02-19 | Discharge: 2023-02-19 | Disposition: A | Discharge status: Home / Self Care | Payer: Medicare Other | Source: Ambulatory Visit | Attending: Cardiology | Admitting: Cardiology

## 2023-02-19 VITALS — BP 149/72 | HR 65 | Ht 61.0 in | Wt 168.4 lb

## 2023-02-19 DIAGNOSIS — I10 Essential (primary) hypertension: Secondary | ICD-10-CM

## 2023-02-19 DIAGNOSIS — R600 Localized edema: Secondary | ICD-10-CM | POA: Diagnosis not present

## 2023-02-19 DIAGNOSIS — E782 Mixed hyperlipidemia: Secondary | ICD-10-CM | POA: Insufficient documentation

## 2023-02-19 LAB — LIPID PANEL
Cholesterol: 176 mg/dL (ref 0–200)
HDL: 50 mg/dL (ref >40)
LDL Cholesterol: 100 mg/dL — ABNORMAL HIGH (ref 0–99)
Total CHOL/HDL Ratio: 3.5 RATIO
Triglycerides: 128 mg/dL (ref <150)
VLDL: 26 mg/dL (ref 0–40)

## 2023-02-19 MED ORDER — CARVEDILOL 12.5 MG PO TABS: 12.5000 mg | ORAL_TABLET | Freq: Two times a day (BID) | ORAL | 3 refills | Status: DC

## 2023-02-19 NOTE — Progress Notes (Signed)
Cardiology Office Note:    Date:  02/19/2023   ID:  Kristen Powell, DOB 08-26-1946, MRN [id number]  PCP:  Burnard Hawthorne, FNP   [facility] Providers Cardiologist:  Kate Sable, MD     Referring MD: Burnard Hawthorne, FNP   Chief Complaint  Patient presents with   Follow-up    4 month follow up visit. Patient states that she is doing well on today with no complaints. Patient brought in blood pressure readings. Meds reviewed.     History of Present Illness:    Kristen Powell is a 77 y.o. female with a hx of hypertension, hyperlipidemia, anxiety who presents for follow-up.    Has been doing well since last visit.  Has some stressors at home, grandson dealing with drug problems.  Blood pressure at home ranging from 120s to 140s.  Feet get swollen after standing for extended periods of time.  Denies chest pain or shortness of breath.  HCTZ previously stopped due to hyponatremia.   Prior notes Echo 1/23 EF 60 to 65%, mild MR, aortic valve not well-visualized. Renal arterial ultrasound 07/2021 with no evidence for stenosis.  Past Medical History:  Diagnosis Date   Anxiety    [id number]    Diastolic dysfunction    a. 12/2021 Echo: EF 60-65%, no rwma, GrI DD, nl RV fxn, RVSP 36.10mHg, mild MR.   Hyperlipidemia    Hypertension    Mild Mitral regurgitation    a. 12/2021 Echo: Mild MR.   Psoriasis    younger age   Spinal stenosis    Traumatic rupture of left ear drum     Past Surgical History:  Procedure Laterality Date   ABDOMINAL HYSTERECTOMY  12/14/1978   Partial   BASAL CELL CARCINOMA EXCISION  2006,2008,2011   BUNIONECTOMY     BUNIONECTOMY Bilateral 12/14/1990   COLONOSCOPY WITH PROPOFOL N/A 12/09/2021   Procedure: COLONOSCOPY WITH PROPOFOL;  Surgeon: WLucilla Lame MD;  [facility];  Service: Endoscopy;  Laterality: N/A;   KNEE ARTHROSCOPY     KNEE CLOSED REDUCTION  05/07/2015   Procedure: CLOSED MANIPULATION KNEE;  Surgeon: MHessie Knows  MD;  Location: [facility] ORS;  Service: Orthopedics;;   KNEE SURGERY Bilateral 2005,2006   Repair   MOHS SURGERY     nose bcc, left cheeck Dr. SDelice Leschf/u Q 12/2020   REPLACEMENT TOTAL KNEE Left 04/11/2015   manipulation--05/07/2015   ROTATOR CUFF REPAIR W/ DISTAL CLAVICLE EXCISION     SHOULDER SURGERY Right    x2   SKIN CANCER EXCISION     TOTAL KNEE ARTHROPLASTY     VAGINAL HYSTERECTOMY      Current Medications: Current Meds  Medication Sig   acetaminophen (TYLENOL) 500 MG tablet Take 1-2 tablets (500-1,000 mg total) by mouth every 4 (four) hours as needed for fever.   amLODipine (NORVASC) 10 MG tablet Take 1 tablet (10 mg total) by mouth daily.   aspirin 81 MG tablet Take 81 mg by mouth daily.   calcium carbonate (OS-CAL) 600 MG TABS tablet Take 600 mg by mouth daily as needed.   Cholecalciferol (VITAMIN D) 2000 UNITS CAPS Take by mouth daily. Patient is taking 3000 units instead of 2000   citalopram (CELEXA) 10 MG tablet TAKE 1 TABLET (10 MG TOTAL) BY MOUTH DAILY. D/C EFFEXOR 37.5   fenofibrate (TRICOR) 145 MG tablet Take 1 tablet (145 mg total) by mouth daily.   gabapentin (NEURONTIN) 100 MG capsule TAKE 1 CAPSULE BY MOUTH AT BEDTIME.  LORazepam (ATIVAN) 0.5 MG tablet TAKE 0.5-1 TABLETS (0.25-0.5 MG TOTAL) BY MOUTH DAILY AS NEEDED FOR ANXIETY   losartan (COZAAR) 100 MG tablet TAKE 1 TABLET (100 MG TOTAL) BY MOUTH DAILY. D/C 50 MG   Omega-3 Fatty Acids (FISH OIL) 1200 MG CAPS Take 1 capsule by mouth 2 (two) times daily.   omeprazole (PRILOSEC) 20 MG capsule Take 1 capsule (20 mg total) by mouth 2 (two) times daily.   simvastatin (ZOCOR) 20 MG tablet Take 1 tablet (20 mg total) by mouth daily. Take in evening   vitamin B-12 (CYANOCOBALAMIN) 1000 MCG tablet Take 1,000 mcg by mouth daily.   [DISCONTINUED] carvedilol (COREG) 6.25 MG tablet TAKE 1 TABLET BY MOUTH 2 TIMES DAILY WITH A MEAL.   Current Facility-Administered Medications for the 02/19/23 encounter (Office Visit) with Kate Sable, MD  Medication   betamethasone acetate-betamethasone sodium phosphate (CELESTONE) injection 3 mg     Allergies:   Hydrochlorothiazide and Adhesive [tape]   Social History   Socioeconomic History   Marital status: Widowed    Spouse name: Not on file   Number of children: Not on file   Years of education: Not on file   Highest education level: Not on file  Occupational History   Not on file  Tobacco Use   Smoking status: Never   Smokeless tobacco: Never  Vaping Use   Vaping Use: Never used  Substance and Sexual Activity   Alcohol use: No   Drug use: No   Sexual activity: Never  Other Topics Concern   Not on file  Social History Narrative   Widowed x 12 years as of 06/2021    3 sisters and 1 brother    Has children son age 41, daughter 58, son 39 as of 06/2021   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      26 great grandchildren         Social Determinants of Health   Financial Resource Strain: Low Risk  (08/21/2022)   Overall Financial Resource Strain (CARDIA)    Difficulty of Paying Living Expenses: Not hard at all  Food Insecurity: No Food Insecurity (08/21/2022)   Hunger Vital Sign    Worried About Running Out of Food in the Last Year: Never true    [place] in the Last Year: Never true  Transportation Needs: No Transportation Needs (08/21/2022)   PRAPARE - Hydrologist (Medical): No    Lack of Transportation (Non-Medical): No  Physical Activity: Sufficiently Active (08/21/2022)   Exercise Vital Sign    Days of Exercise per Week: 5 days    Minutes of Exercise per Session: 30 min  Stress: No Stress Concern Present (08/21/2022)   [place]    Feeling of Stress : Not at all  Social Connections: Unknown (08/21/2022)   Social Connection and Isolation Panel [NHANES]    Frequency of Communication with Friends and Family: More than three times a week    Frequency of Social  Gatherings with Friends and Family: More than three times a week    Attends Religious Services: More than 4 times per year    Active Member of [employer] or Organizations: Not on file    Attends Music therapist: More than 4 times per year    Marital Status: Not on file     Family History: The patient's family history includes Breast cancer in her paternal aunt;  Cancer in her mother; Heart attack (age of onset: 24) in her sister; Hypertension in her sister.  ROS:   Please see the history of present illness.     All other systems reviewed and are negative.  EKGs/Labs/Other Studies Reviewed:    The following studies were reviewed today:   EKG:  EKG not ordered today.    Recent Labs: 02/26/2022: TSH 3.18 09/18/2022: ALT 20; Hemoglobin 11.3; Platelets 258.0 12/22/2022: BUN 25; Creatinine, Ser 1.07; Potassium 4.8; Sodium 134  Recent Lipid Panel    Component Value Date/Time   CHOL 169 04/17/2022 0933   CHOL 183 12/19/2021 0814   TRIG 114 04/17/2022 0933   HDL 55 04/17/2022 0933   HDL 48 12/19/2021 0814   CHOLHDL 3.1 04/17/2022 0933   VLDL 23 04/17/2022 0933   LDLCALC 91 04/17/2022 0933   LDLCALC 96 12/19/2021 0814   LDLDIRECT 123.0 04/29/2021 0940     Risk Assessment/Calculations:          Physical Exam:    VS:  BP (!) 149/72 (BP Location: Left Arm, Patient Position: Sitting, Cuff Size: Normal)   Pulse 65   Ht '5\' 1"'$  (1.549 m)   Wt 168 lb 6.4 oz (76.4 kg)   SpO2 98%   BMI 31.82 kg/m     Wt Readings from Last 3 Encounters:  02/19/23 168 lb 6.4 oz (76.4 kg)  12/29/22 165 lb (74.8 kg)  12/22/22 165 lb (74.8 kg)     GEN:  Well nourished, well developed in no acute distress HEENT: Normal NECK: No JVD; No carotid bruits CARDIAC: RRR, 2/6 systolic murmur RESPIRATORY:  Clear to auscultation without rales, wheezing or rhonchi  ABDOMEN: Soft, non-tender, non-distended MUSCULOSKELETAL: Trace edema; varicose veins SKIN: Warm and dry NEUROLOGIC:  Alert and  oriented x 3 PSYCHIATRIC:  Normal affect   ASSESSMENT:    1. Primary hypertension   2. Mixed hyperlipidemia   3. Leg edema    PLAN:    In order of problems listed above:  Hypertension, BP elevated today.  Increase Coreg to 12.5 mg twice daily, continue Norvasc 10 mg daily, losartan 100 mg daily.   Renal artery ultrasound with no evidence for stenosis.  HCTZ previously caused hyponatremia. Hyperlipidemia, continue fenofibrate 145 mg daily, simvastatin 20 mg daily.  Obtain fasting lipid profile. Trace edema, varicose veins, dependent edema.  Compression stockings, leg raising while in seated position advised.  Follow-up in 3 months.      Medication Adjustments/Labs and Tests Ordered: Current medicines are reviewed at length with the patient today.  Concerns regarding medicines are outlined above.  Orders Placed This Encounter  Procedures   Lipid panel   Meds ordered this encounter  Medications   carvedilol (COREG) 12.5 MG tablet    Sig: Take 1 tablet (12.5 mg total) by mouth 2 (two) times daily with a meal.    Dispense:  90 tablet    Refill:  3    Patient Instructions  Medication Instructions:   INCREASE Carvedilol - Take one tablet ( 12.5 mg) by mouth twice a day.  - if you have any 6.25 mg tablets you can take 2 tablets by mouth twice a day until they are gone.   *If you need a refill on your cardiac medications before your next appointment, please call your pharmacy*   Lab Work:  Your physician recommends you go to the medical mall for labs.   If you have labs (blood work) drawn today and your tests are completely normal,  you will receive your results only by: [place] (if you have MyChart) OR A paper copy in the mail If you have any lab test that is abnormal or we need to change your treatment, we will call you to review the results.   Testing/Procedures:  None Ordered    Follow-Up: At [facility], you and your health needs are our  priority.  As part of our continuing mission to provide you with exceptional heart care, we have created designated Provider Care Teams.  These Care Teams include your primary Cardiologist (physician) and Advanced Practice Providers (APPs -  Physician Assistants and Nurse Practitioners) who all work together to provide you with the care you need, when you need it.  We recommend signing up for the patient portal called "MyChart".  Sign up information is provided on this After Visit Summary.  MyChart is used to connect with patients for Virtual Visits (Telemedicine).  Patients are able to view lab/test results, encounter notes, upcoming appointments, etc.  Non-urgent messages can be sent to your provider as well.   To learn more about what you can do with MyChart, go to [email or web address].    Your next appointment:   3 month(s)  Provider:   You may see Kate Sable, MD or one of the following Advanced Practice Providers on your designated Care Team:   Murray Hodgkins, NP Christell Faith, PA-C Cadence Kathlen Mody, PA-C Gerrie Nordmann, NP    Signed, Kate Sable, MD  02/19/2023 10:49 AM    [place]

## 2023-02-19 NOTE — Patient Instructions (Signed)
Medication Instructions:   INCREASE Carvedilol - Take one tablet ( 12.5 mg) by mouth twice a day.  - if you have any 6.25 mg tablets you can take 2 tablets by mouth twice a day until they are gone.   *If you need a refill on your cardiac medications before your next appointment, please call your pharmacy*   Lab Work:  Your physician recommends you go to the medical mall for labs.   If you have labs (blood work) drawn today and your tests are completely normal, you will receive your results only by: [place] (if you have MyChart) OR A paper copy in the mail If you have any lab test that is abnormal or we need to change your treatment, we will call you to review the results.   Testing/Procedures:  None Ordered    Follow-Up: At [facility], you and your health needs are our priority.  As part of our continuing mission to provide you with exceptional heart care, we have created designated Provider Care Teams.  These Care Teams include your primary Cardiologist (physician) and Advanced Practice Providers (APPs -  Physician Assistants and Nurse Practitioners) who all work together to provide you with the care you need, when you need it.  We recommend signing up for the patient portal called "MyChart".  Sign up information is provided on this After Visit Summary.  MyChart is used to connect with patients for Virtual Visits (Telemedicine).  Patients are able to view lab/test results, encounter notes, upcoming appointments, etc.  Non-urgent messages can be sent to your provider as well.   To learn more about what you can do with MyChart, go to [email or web address].    Your next appointment:   3 month(s)  Provider:   You may see Kate Sable, MD or one of the following Advanced Practice Providers on your designated Care Team:   Murray Hodgkins, NP Christell Faith, PA-C Cadence Kathlen Mody, PA-C Gerrie Nordmann, NP

## 2023-03-03 ENCOUNTER — Ambulatory Visit (INDEPENDENT_AMBULATORY_CARE_PROVIDER_SITE_OTHER): Payer: Medicare Other | Admitting: Family

## 2023-03-03 ENCOUNTER — Encounter: Payer: Self-pay | Admitting: Family

## 2023-03-03 DIAGNOSIS — U071 COVID-19: Secondary | ICD-10-CM

## 2023-03-03 MED ORDER — BENZONATATE 100 MG PO CAPS: 100.0000 mg | ORAL_CAPSULE | Freq: Three times a day (TID) | ORAL | 1 refills | Status: DC | PRN

## 2023-03-03 MED ORDER — NIRMATRELVIR/RITONAVIR (PAXLOVID) TABLET (RENAL DOSING): 2.0000 | ORAL_TABLET | Freq: Two times a day (BID) | ORAL | 0 refills | Status: AC

## 2023-03-03 NOTE — Patient Instructions (Addendum)
Please hold simvastatin while you are on Paxlovid due to drug interaction.  Amlodipine could become more potent so it is reasonable to cut tablet in half and take 5 mg tablet while taking Paxlovid.  You may also monitor your blood pressure   you no longer have to quarantine per cdc guidelines as long as you are fever free without antipyretic (Tylenol or ibuprofen) for 24 hours.    We discussed starting Paxlovid which is an unapproved drug that is authorized for use under an Emergency Use Authorization.  There are no adequate, approved, available products for the treatment of COVID-19 in adults who have mild-to-moderate COVID-19 and are at high risk for progressing to severe COVID-19, including hospitalization or death.  There are benefits and risks of taking this treatment as outlined in the "Fact Sheet for Patients and Caregivers." You may find this document here and please read in detail   [email or web address]   I have sent Paxlovid to your pharmacy. Please call pharmacy so they bring medication out to your car and you do not have to go inside.   PAXLOVID ADMINISTRATION INSTRUCTIONS:  Take with or without food. Swallow the tablets whole. Don't chew, crush, or break the medications because it might not work as well  For each dose of the medication, you should be taking 3 tablets together (2 pink oval and 1 white oval) TWICE a day for FIVE days   Finish your full five-day course of Paxlovid even if you feel better before you're done. Stopping this medication too early can make it less effective to prevent severe illness related to [place].    Paxlovid is prescribed for YOU ONLY. Don't share it with others, even if they have similar symptoms as you. This medication might not be right for everyone.  Make sure to take steps to protect yourself and others while you're taking this medication in order to get well soon and to prevent others from getting sick with  COVID-19.  Paxlovid (nirmatrelvir / ritonavir) can cause hormonal birth control medications to not work well. If you or your partner is currently taking hormonal birth control, use condoms or other birth control methods to prevent unintended pregnancies.   COMMON SIDE EFFECTS: Altered or bad taste in your mouth  Diarrhea  High blood pressure (1% of people) Muscle aches (1% of people)    If your COVID-19 symptoms get worse, get medical help right away. Call 911 if you experience symptoms such as worsening cough, trouble breathing, chest pain that doesn't go away, confusion, a hard time staying awake, and pale or blue-colored skin.This medication won't prevent all COVID-19 cases from getting worse.

## 2023-03-03 NOTE — Progress Notes (Signed)
Verbal consent for services obtained from patient prior to services given to TELEPHONE visit:   Location of call:  provider at work patient at home  Names of all persons present for services: Mable Paris, NP and patient Complains of HA, cough, congestion x 3 days ago.   HA is dull pain.  She is covid positive yesterday.   She is drinking well. Decreased appetite.  No fever, cp, sob.   Her sister has covid She has been taking cloricidin hbp  GFR 50  She has taken paxlovid in the past for covid.   A/P/next steps:  COVID-19 Assessment & Plan: No acute respiratory distress. GFR 50. Counseled on lacking long term safely and effectiveness data of medication, Paxlovid. Explained EUA for Paxlovid. Criteria met for consideration of Paxlovid,  patient older than 12 years and weight > 40kg, started within 5 days of symptom onset and risk factor for severe disease include: age, CKD  Counseled on adverse effects including altered taste, diarrhea, HTN, and myalgia.   Patient is most comfortable and desires to start Paxlovid and understands to call me with concerns or new symptoms. She will hold statin and monitor BP while on amlodipine due to interaction between paxlovid.    Orders: -     Benzonatate; Take 1 capsule (100 mg total) by mouth 3 (three) times daily as needed for cough.  Dispense: 20 capsule; Refill: 1 -     nirmatrelvir/ritonavir (renal dosing); Take 2 tablets by mouth 2 (two) times daily for 5 days. (Take nirmatrelvir 150 mg one tablet twice daily for 5 days and ritonavir 100 mg one tablet twice daily for 5 days) Patient GFR is 50  Dispense: 20 tablet; Refill: 0     Advised patient AVS could be mailed or viewed over [place] if [place] user.   I spent 15 min  discussing plan of care over the phone.

## 2023-03-03 NOTE — Assessment & Plan Note (Signed)
No acute respiratory distress. GFR 50. Counseled on lacking long term safely and effectiveness data of medication, Paxlovid. Explained EUA for Paxlovid. Criteria met for consideration of Paxlovid,  patient older than 12 years and weight > 40kg, started within 5 days of symptom onset and risk factor for severe disease include: age, CKD  Counseled on adverse effects including altered taste, diarrhea, HTN, and myalgia.   Patient is most comfortable and desires to start Paxlovid and understands to call me with concerns or new symptoms. She will hold statin and monitor BP while on amlodipine due to interaction between paxlovid.

## 2023-03-12 ENCOUNTER — Ambulatory Visit
Admission: RE | Admit: 2023-03-12 | Discharge: 2023-03-12 | Disposition: A | Discharge status: Home / Self Care | Payer: Medicare Other | Source: Ambulatory Visit | Attending: Family | Admitting: Family

## 2023-03-12 DIAGNOSIS — G8929 Other chronic pain: Secondary | ICD-10-CM

## 2023-03-12 DIAGNOSIS — Z780 Asymptomatic menopausal state: Secondary | ICD-10-CM

## 2023-03-12 DIAGNOSIS — Z13820 Encounter for screening for osteoporosis: Secondary | ICD-10-CM

## 2023-03-12 DIAGNOSIS — M85852 Other specified disorders of bone density and structure, left thigh: Secondary | ICD-10-CM | POA: Diagnosis not present

## 2023-03-17 ENCOUNTER — Other Ambulatory Visit (INDEPENDENT_AMBULATORY_CARE_PROVIDER_SITE_OTHER): Payer: Self-pay | Admitting: Nurse Practitioner

## 2023-03-17 DIAGNOSIS — I739 Peripheral vascular disease, unspecified: Secondary | ICD-10-CM

## 2023-03-22 ENCOUNTER — Encounter: Payer: Self-pay | Admitting: Family

## 2023-03-22 ENCOUNTER — Ambulatory Visit (INDEPENDENT_AMBULATORY_CARE_PROVIDER_SITE_OTHER): Payer: Medicare Other | Admitting: Family

## 2023-03-22 VITALS — BP 132/82 | HR 64 | Temp 97.8°F | Ht 59.5 in | Wt 168.4 lb

## 2023-03-22 DIAGNOSIS — F419 Anxiety disorder, unspecified: Secondary | ICD-10-CM

## 2023-03-22 DIAGNOSIS — I10 Essential (primary) hypertension: Secondary | ICD-10-CM | POA: Diagnosis not present

## 2023-03-22 DIAGNOSIS — E782 Mixed hyperlipidemia: Secondary | ICD-10-CM | POA: Diagnosis not present

## 2023-03-22 MED ORDER — CITALOPRAM HYDROBROMIDE 10 MG PO TABS: 10.0000 mg | ORAL_TABLET | Freq: Every day | ORAL | 3 refills | Status: DC

## 2023-03-22 NOTE — Patient Instructions (Signed)
Nice to see you!   

## 2023-03-22 NOTE — Progress Notes (Signed)
Assessment & Plan:  Essential hypertension Assessment & Plan: Chronic, stable.continue carvedilol 12.5mg  twice daily, amlodipine 10 mg daily, losartan 100 mg daily   Anxiety -     Citalopram Hydrobromide; Take 1 tablet (10 mg total) by mouth daily.  Dispense: 90 tablet; Refill: 3  Mixed hyperlipidemia Assessment & Plan: Lab Results  Component Value Date   LDLCALC 100 (H) 02/19/2023   Borderline control. Will monitor.  Continue heart healthy diet,fenofibrate 145 mg daily, simvastatin 20 mg daily      Return precautions given.   Risks, benefits, and alternatives of the medications and treatment plan prescribed today were discussed, and patient expressed understanding.   Education regarding symptom management and diagnosis given to patient on AVS either electronically or printed.  Return in about 6 months (around 09/21/2023) for Medicare Wellness upcoming or due, schedule.  Rennie Plowman, FNP  Subjective:    Patient ID: Kristen Powell, female    DOB: 1946/10/13, 77 y.o.   MRN: [id number]  CC: Kristen Powell is a 77 y.o. female who presents today for follow up.   HPI: Feels well today.  No new complaints.   blood pressures at home 136/58, 138/58, 139/58   HTN- compliant with carvedilol 12.5mg  twice daily, amlodipine 10 mg daily, losartan 100 mg daily . She has not taken carvedilol yet today however feels recent increase has been effective for better blood pressure control.  No cp, dizziness.   HLD-compliant with fenofibrate 145 mg daily, simvastatin 20 mg daily  Anxiety- compliant with celexa 10mg ; rare use of ativan 0.5mg    Follow-up with vascular 12/29/22 for PAD, leg swelling Follow-up cardiology 02/19/2019 for hypertension.  Increase Coreg to 12.5 mg twice daily.  Continue Norvasc 10 mg daily, losartan 100 mg daily.     Allergies: Hydrochlorothiazide and Adhesive [tape] Current Outpatient Medications on File Prior to Visit  Medication Sig Dispense  Refill   acetaminophen (TYLENOL) 500 MG tablet Take 1-2 tablets (500-1,000 mg total) by mouth every 4 (four) hours as needed for fever. 30 tablet 0   amLODipine (NORVASC) 10 MG tablet Take 1 tablet (10 mg total) by mouth daily. 90 tablet 0   aspirin 81 MG tablet Take 81 mg by mouth daily.     calcium carbonate (OS-CAL) 600 MG TABS tablet Take 600 mg by mouth daily as needed.     carvedilol (COREG) 12.5 MG tablet Take 1 tablet (12.5 mg total) by mouth 2 (two) times daily with a meal. 90 tablet 3   Cholecalciferol (VITAMIN D) 2000 UNITS CAPS Take by mouth daily. Patient is taking 3000 units instead of 2000     fenofibrate (TRICOR) 145 MG tablet Take 1 tablet (145 mg total) by mouth daily. 90 tablet 3   gabapentin (NEURONTIN) 100 MG capsule TAKE 1 CAPSULE BY MOUTH AT BEDTIME. 90 capsule 3   LORazepam (ATIVAN) 0.5 MG tablet TAKE 0.5-1 TABLETS (0.25-0.5 MG TOTAL) BY MOUTH DAILY AS NEEDED FOR ANXIETY 30 tablet 2   losartan (COZAAR) 100 MG tablet TAKE 1 TABLET (100 MG TOTAL) BY MOUTH DAILY. D/C 50 MG 90 tablet 3   Omega-3 Fatty Acids (FISH OIL) 1200 MG CAPS Take 1 capsule by mouth 2 (two) times daily.     omeprazole (PRILOSEC) 20 MG capsule Take 1 capsule (20 mg total) by mouth 2 (two) times daily. 180 capsule 3   simvastatin (ZOCOR) 20 MG tablet Take 1 tablet (20 mg total) by mouth daily. Take in evening 90 tablet 3   vitamin  B-12 (CYANOCOBALAMIN) 1000 MCG tablet Take 1,000 mcg by mouth daily.     No current facility-administered medications on file prior to visit.    Review of Systems  Constitutional:  Negative for chills and fever.  Respiratory:  Negative for cough.   Cardiovascular:  Negative for chest pain and palpitations.  Gastrointestinal:  Negative for nausea and vomiting.      Objective:    BP 132/82   Pulse 64   Temp 97.8 F (36.6 C) (Oral)   Ht 4' 11.5" (1.511 m)   Wt 168 lb 6.4 oz (76.4 kg)   SpO2 99%   BMI 33.44 kg/m  BP Readings from Last 3 Encounters:  03/22/23 132/82   02/19/23 (!) 149/72  12/29/22 (!) 158/67   Wt Readings from Last 3 Encounters:  03/22/23 168 lb 6.4 oz (76.4 kg)  02/19/23 168 lb 6.4 oz (76.4 kg)  12/29/22 165 lb (74.8 kg)    Physical Exam Vitals reviewed.  Constitutional:      Appearance: She is well-developed.  Eyes:     Conjunctiva/sclera: Conjunctivae normal.  Cardiovascular:     Rate and Rhythm: Normal rate and regular rhythm.     Pulses: Normal pulses.     Heart sounds: Normal heart sounds.  Pulmonary:     Effort: Pulmonary effort is normal.     Breath sounds: Normal breath sounds. No wheezing, rhonchi or rales.  Skin:    General: Skin is warm and dry.  Neurological:     Mental Status: She is alert.  Psychiatric:        Speech: Speech normal.        Behavior: Behavior normal.        Thought Content: Thought content normal.

## 2023-03-22 NOTE — Assessment & Plan Note (Signed)
Chronic, stable.continue carvedilol 12.5mg  twice daily, amlodipine 10 mg daily, losartan 100 mg daily

## 2023-03-22 NOTE — Assessment & Plan Note (Signed)
Lab Results  Component Value Date   LDLCALC 100 (H) 02/19/2023   Borderline control. Will monitor.  Continue heart healthy diet,fenofibrate 145 mg daily, simvastatin 20 mg daily

## 2023-03-23 ENCOUNTER — Telehealth: Payer: Self-pay

## 2023-03-23 NOTE — Telephone Encounter (Signed)
LVM to call back if she has questions even though pt has seen results...Marland KitchenMarland KitchenSeen by patient Kristen Powell Click on 03/22/2023  9:33 AM

## 2023-03-30 ENCOUNTER — Ambulatory Visit (INDEPENDENT_AMBULATORY_CARE_PROVIDER_SITE_OTHER): Payer: Medicare Other | Admitting: Vascular Surgery

## 2023-03-30 ENCOUNTER — Ambulatory Visit (INDEPENDENT_AMBULATORY_CARE_PROVIDER_SITE_OTHER): Payer: Medicare Other

## 2023-03-30 ENCOUNTER — Encounter (INDEPENDENT_AMBULATORY_CARE_PROVIDER_SITE_OTHER): Payer: Self-pay | Admitting: Vascular Surgery

## 2023-03-30 VITALS — BP 160/61 | HR 66 | Resp 15 | Wt 167.6 lb

## 2023-03-30 DIAGNOSIS — E782 Mixed hyperlipidemia: Secondary | ICD-10-CM

## 2023-03-30 DIAGNOSIS — I739 Peripheral vascular disease, unspecified: Secondary | ICD-10-CM | POA: Diagnosis not present

## 2023-03-30 DIAGNOSIS — I70211 Atherosclerosis of native arteries of extremities with intermittent claudication, right leg: Secondary | ICD-10-CM

## 2023-03-30 DIAGNOSIS — I10 Essential (primary) hypertension: Secondary | ICD-10-CM | POA: Diagnosis not present

## 2023-03-30 NOTE — Progress Notes (Signed)
MRN : [id number]  Kristen Powell is a 77 y.o. (1946-02-16) female who presents with chief complaint of No chief complaint on file. Marland Kitchen  History of Present Illness: Patient returns today in follow up of ***  Current Outpatient Medications  Medication Sig Dispense Refill   acetaminophen (TYLENOL) 500 MG tablet Take 1-2 tablets (500-1,000 mg total) by mouth every 4 (four) hours as needed for fever. 30 tablet 0   amLODipine (NORVASC) 10 MG tablet Take 1 tablet (10 mg total) by mouth daily. 90 tablet 0   aspirin 81 MG tablet Take 81 mg by mouth daily.     calcium carbonate (OS-CAL) 600 MG TABS tablet Take 600 mg by mouth daily as needed.     carvedilol (COREG) 12.5 MG tablet Take 1 tablet (12.5 mg total) by mouth 2 (two) times daily with a meal. 90 tablet 3   Cholecalciferol (VITAMIN D) 2000 UNITS CAPS Take by mouth daily. Patient is taking 3000 units instead of 2000     citalopram (CELEXA) 10 MG tablet Take 1 tablet (10 mg total) by mouth daily. 90 tablet 3   fenofibrate (TRICOR) 145 MG tablet Take 1 tablet (145 mg total) by mouth daily. 90 tablet 3   gabapentin (NEURONTIN) 100 MG capsule TAKE 1 CAPSULE BY MOUTH AT BEDTIME. 90 capsule 3   LORazepam (ATIVAN) 0.5 MG tablet TAKE 0.5-1 TABLETS (0.25-0.5 MG TOTAL) BY MOUTH DAILY AS NEEDED FOR ANXIETY 30 tablet 2   losartan (COZAAR) 100 MG tablet TAKE 1 TABLET (100 MG TOTAL) BY MOUTH DAILY. D/C 50 MG 90 tablet 3   Omega-3 Fatty Acids (FISH OIL) 1200 MG CAPS Take 1 capsule by mouth 2 (two) times daily.     omeprazole (PRILOSEC) 20 MG capsule Take 1 capsule (20 mg total) by mouth 2 (two) times daily. 180 capsule 3   simvastatin (ZOCOR) 20 MG tablet Take 1 tablet (20 mg total) by mouth daily. Take in evening 90 tablet 3   vitamin B-12 (CYANOCOBALAMIN) 1000 MCG tablet Take 1,000 mcg by mouth daily.     No current facility-administered medications for this visit.    Past Medical History:  Diagnosis Date   Anxiety    COVID-19    Diastolic  dysfunction    a. 12/2021 Echo: EF 60-65%, no rwma, GrI DD, nl RV fxn, RVSP 36.32mmHg, mild MR.   Hyperlipidemia    Hypertension    Mild Mitral regurgitation    a. 12/2021 Echo: Mild MR.   Psoriasis    younger age   Spinal stenosis    Traumatic rupture of left ear drum     Past Surgical History:  Procedure Laterality Date   ABDOMINAL HYSTERECTOMY  12/14/1978   Partial   BASAL CELL CARCINOMA EXCISION  2006,2008,2011   BUNIONECTOMY     BUNIONECTOMY Bilateral 12/14/1990   COLONOSCOPY WITH PROPOFOL N/A 12/09/2021   Procedure: COLONOSCOPY WITH PROPOFOL;  Surgeon: Midge Minium, MD;  Location: [facility] ENDOSCOPY;  Service: Endoscopy;  Laterality: N/A;   KNEE ARTHROSCOPY     KNEE CLOSED REDUCTION  05/07/2015   Procedure: CLOSED MANIPULATION KNEE;  Surgeon: Kennedy Bucker, MD;  Location: [facility] ORS;  Service: Orthopedics;;   KNEE SURGERY Bilateral 2005,2006   Repair   MOHS SURGERY     nose bcc, left cheeck Dr. Meredith Mody f/u Q 12/2020   REPLACEMENT TOTAL KNEE Left 04/11/2015   manipulation--05/07/2015   ROTATOR CUFF REPAIR W/ DISTAL CLAVICLE EXCISION     SHOULDER SURGERY Right    x2  SKIN CANCER EXCISION     TOTAL KNEE ARTHROPLASTY     VAGINAL HYSTERECTOMY       Social History   Tobacco Use   Smoking status: Never   Smokeless tobacco: Never  Vaping Use   Vaping Use: Never used  Substance Use Topics   Alcohol use: No   Drug use: No   ***    Family History  Problem Relation Age of Onset   Cancer Mother        lung   Hypertension Sister    Heart attack Sister 84   Breast cancer Paternal Aunt    ***  Allergies  Allergen Reactions   Hydrochlorothiazide Other (See Comments)    Hyponatremia    Adhesive [Tape] Rash    redness     REVIEW OF SYSTEMS (Negative unless checked)  Constitutional: [] Weight loss  [] Fever  [] Chills Cardiac: [] Chest pain   [] Chest pressure   [] Palpitations   [] Shortness of breath when laying flat   [] Shortness of breath at rest   [] Shortness of breath  with exertion. Vascular:  [] Pain in legs with walking   [] Pain in legs at rest   [] Pain in legs when laying flat   [] Claudication   [] Pain in feet when walking  [] Pain in feet at rest  [] Pain in feet when laying flat   [] History of DVT   [] Phlebitis   [] Swelling in legs   [] Varicose veins   [] Non-healing ulcers Pulmonary:   [] Uses home oxygen   [] Productive cough   [] Hemoptysis   [] Wheeze  [] COPD   [] Asthma Neurologic:  [] Dizziness  [] Blackouts   [] Seizures   [] History of stroke   [] History of TIA  [] Aphasia   [] Temporary blindness   [] Dysphagia   [] Weakness or numbness in arms   [] Weakness or numbness in legs Musculoskeletal:  [] Arthritis   [] Joint swelling   [] Joint pain   [] Low back pain Hematologic:  [] Easy bruising  [] Easy bleeding   [] Hypercoagulable state   [] Anemic   Gastrointestinal:  [] Blood in stool   [] Vomiting blood  [] Gastroesophageal reflux/heartburn   [] Abdominal pain Genitourinary:  [] Chronic kidney disease   [] Difficult urination  [] Frequent urination  [] Burning with urination   [] Hematuria Skin:  [] Rashes   [] Ulcers   [] Wounds Psychological:  [] History of anxiety   []  History of major depression.  Physical Examination  There were no vitals taken for this visit. Gen:  WD/WN, NAD Head: Wallace/AT, No temporalis wasting. Ear/Nose/Throat: Hearing grossly intact, nares w/o erythema or drainage Eyes: Conjunctiva clear. Sclera non-icteric Neck: Supple.  Trachea midline Pulmonary:  Good air movement, no use of accessory muscles.  Cardiac: RRR, no JVD Vascular: *** Vessel Right Left  Radial Palpable Palpable                          PT Palpable Palpable  DP Palpable Palpable   Gastrointestinal: soft, non-tender/non-distended. No guarding/reflex.  Musculoskeletal: M/S 5/5 throughout.  No deformity or atrophy. *** edema. Neurologic: Sensation grossly intact in extremities.  Symmetrical.  Speech is fluent.  Psychiatric: Judgment intact, Mood & affect appropriate for pt's  clinical situation. Dermatologic: No rashes or ulcers noted.  No cellulitis or open wounds.      Labs Recent Results (from the past 2160 hour(s))  Lipid panel     Status: Abnormal   Collection Time: 02/19/23 10:33 AM  Result Value Ref Range   Cholesterol 176 0 - 200 mg/dL   Triglycerides 161 <096 mg/dL  HDL 50 >40 mg/dL   Total CHOL/HDL Ratio 3.5 RATIO   VLDL 26 0 - 40 mg/dL   LDL Cholesterol 130 (H) 0 - 99 mg/dL    Comment:        Total Cholesterol/HDL:CHD Risk Coronary Heart Disease Risk Table                     Men   Women  1/2 Average Risk   3.4   3.3  Average Risk       5.0   4.4  2 X Average Risk   9.6   7.1  3 X Average Risk  23.4   11.0        Use the calculated Patient Ratio above and the CHD Risk Table to determine the patient's CHD Risk.        ATP III CLASSIFICATION (LDL):  <100     mg/dL   Optimal  865-784  mg/dL   Near or Above                    Optimal  130-159  mg/dL   Borderline  696-295  mg/dL   High  >284     mg/dL   Very High Performed at [facility], [address] [place], [place] [address]     Radiology DG BONE DENSITY (DXA)  Result Date: 03/12/2023 EXAM: DUAL X-RAY ABSORPTIOMETRY (DXA) FOR BONE MINERAL DENSITY IMPRESSION: Referring Physician:  Lyn Records ARNETT Your patient completed a bone mineral density test using GE Lunar iDXA system (analysis version: 16). Technologist:   lmn PATIENT: Name: Aruna, Nestler Patient ID: [id number] Birth Date: [birth year] Height: 59.5 in. Sex: Female Measured: 03/12/2023 Weight: 167.4 lbs. Indications: Advanced Age, Caucasian, Estrogen Deficient, Hysterectomy, Postmenopausal Fractures: Toe Treatments: Calcium (E943.0), Vitamin D (E933.5) ASSESSMENT: The BMD measured at Femur Neck Left is 0.773 g/cm2 with a T-score of -1.9. This patient is considered osteopenic/low bone mass according to World Health Organization [facility]) criteria. The quality of the exam is good. Site Region Measured Date Measured Age  YA BMD Significant CHANGE T-score DualFemur Neck Left  3/12/2023    77.1         -1.9    0.773 g/cm2 DualFemur Neck Left 10/18/2019 73.7 -1.8 0.793 g/cm2 * AP Spine  L1-L4      3/12/2023    77.1         -0.5    1.125 g/cm2 AP Spine  L1-L4      10/18/2019    73.7         -0.5    1.122 g/cm2 DualFemur Total Mean 03/12/2023    77.1         -0.9    0.891 g/cm2 DualFemur Total Mean 10/18/2019    73.7         -0.9    0.896 g/cm2 World Health Organization [facility]) criteria for post-menopausal, Caucasian Women: Normal       T-score at or above -1 SD Osteopenia   T-score between -1 and -2.5 SD Osteoporosis T-score at or below -2.5 SD RECOMMENDATION: 1. All patients should optimize calcium and vitamin D intake. 2. Consider FDA-approved medical therapies in postmenopausal women and men aged 7 years and older, based on the following: a. A hip or vertebral (clinical or morphometric) fracture. b. T-score = -2.5 at the femoral neck or spine after appropriate evaluation to exclude secondary causes. c. Low bone mass (T-score between -1.0 and -2.5 at the femoral  neck or spine) and a 10-year probability of a hip fracture = 3% or a 10-year probability of a major osteoporosis-related fracture = 20% based on the [place]-adapted WHO algorithm. d. Clinician judgment and/or patient preferences may indicate treatment for people with 10-year fracture probabilities above or below these levels. FOLLOW-UP: Patients with diagnosis of osteoporosis or at high risk for fracture should have regular bone mineral density tests.? Patients eligible for Medicare are allowed routine testing every 2 years.? The testing frequency can be increased to one year for patients who have rapidly progressing disease, are receiving or discontinuing medical therapy to restore bone mass, or have additional risk factors. I have reviewed this study and agree with the findings. [facility] FRAX* 10-year Probability of Fracture Based on femoral neck BMD: DualFemur  (Left) Major Osteoporotic Fracture: 13.1% Hip Fracture:                3.3% Population:                  [place] (Caucasian) Risk Factors:                None *FRAX is a Armed forces logistics/support/administrative officer of the [employer] of [employer] for Metabolic Bone Disease, a World Science writer (WHO) [employer]. ASSESSMENT: The probability of a major osteoporotic fracture is 13.1% within the next ten years. The probability of a hip fracture is 3.3% within the next ten years. Electronically Signed   By: Romona Curls M.D.   On: 03/12/2023 13:58    Assessment/Plan  No problem-specific Assessment & Plan notes found for this encounter.    Festus Barren, MD  03/30/2023 9:28 AM    This note was created with Dragon medical transcription system.  Any errors from dictation are purely unintentional

## 2023-03-31 DIAGNOSIS — I70219 Atherosclerosis of native arteries of extremities with intermittent claudication, unspecified extremity: Secondary | ICD-10-CM | POA: Insufficient documentation

## 2023-03-31 LAB — VAS US ABI WITH/WO TBI
Left ABI: 0.98
Right ABI: 0.85

## 2023-03-31 NOTE — Assessment & Plan Note (Addendum)
lipid control important in reducing the progression of atherosclerotic disease. On Fish oil and Tricor

## 2023-03-31 NOTE — Assessment & Plan Note (Signed)
blood pressure control important in reducing the progression of atherosclerotic disease. On appropriate oral medications.  

## 2023-03-31 NOTE — Assessment & Plan Note (Addendum)
Her right ABI 0.85 and her left ABI 0.98.  Duplex shows hemodynamically significant stenosis in the right SFA with subcritical stenosis of the left SFA.   We had a long discussion today about her peripheral arterial disease.  This is clearly a component of her right lower extremity claudication symptoms.  She has fairly significant arthritis and other issues including neuropathy which are playing a role in her symptoms as well.  I discussed intervention to improve her pain with activity particularly in the calf and lower leg.  I discussed that she is certainly not any limb threatening situation and continued conservative measures with increasing activity and antiplatelet therapy are appropriate.  At this time, she does not really want to proceed with intervention and that is certainly reasonable.  I will plan to see her back in about 6 months with noninvasive studies.

## 2023-04-19 ENCOUNTER — Other Ambulatory Visit: Payer: Self-pay

## 2023-04-19 MED ORDER — AMLODIPINE BESYLATE 10 MG PO TABS: 10.0000 mg | ORAL_TABLET | Freq: Every day | ORAL | 0 refills | Status: DC

## 2023-04-19 NOTE — Telephone Encounter (Signed)
last visit 02/19/2023--Follow-up in 3 months.  next visit-05/24/23

## 2023-05-17 ENCOUNTER — Other Ambulatory Visit: Payer: Self-pay | Admitting: Family

## 2023-05-17 ENCOUNTER — Ambulatory Visit
Admission: EM | Admit: 2023-05-17 | Discharge: 2023-05-17 | Disposition: A | Discharge status: Home / Self Care | Payer: Medicare Other | Attending: Emergency Medicine | Admitting: Emergency Medicine

## 2023-05-17 DIAGNOSIS — I10 Essential (primary) hypertension: Secondary | ICD-10-CM

## 2023-05-17 DIAGNOSIS — J069 Acute upper respiratory infection, unspecified: Secondary | ICD-10-CM

## 2023-05-17 MED ORDER — AZITHROMYCIN 250 MG PO TABS: 250.0000 mg | ORAL_TABLET | Freq: Every day | ORAL | 0 refills | Status: DC

## 2023-05-17 MED ORDER — BENZONATATE 100 MG PO CAPS: 100.0000 mg | ORAL_CAPSULE | Freq: Three times a day (TID) | ORAL | 0 refills | Status: DC

## 2023-05-17 NOTE — Discharge Instructions (Signed)
As your symptoms have been going on for 7 days without any signs of resolution we will provide bacterial coverage to ensure that this is not causing him to prolong  Take azithromycin as directed  You may take Tessalon pill every 8 hours as needed to help calm your coughing, you may take this in addition to the over-the-counter medication you have already purchased    You can take Tylenol and/or Ibuprofen as needed for fever reduction and pain relief.   For cough: honey 1/2 to 1 teaspoon (you can dilute the honey in water or another fluid).  You can also use guaifenesin and dextromethorphan for cough. You can use a humidifier for chest congestion and cough.  If you don't have a humidifier, you can sit in the bathroom with the hot shower running.      For sore throat: try warm salt water gargles, cepacol lozenges, throat spray, warm tea or water with lemon/honey, popsicles or ice, or OTC cold relief medicine for throat discomfort.   For congestion: take a daily anti-histamine like Zyrtec, Claritin, and a oral decongestant, such as pseudoephedrine.  You can also use Flonase 1-2 sprays in each nostril daily.   It is important to stay hydrated: drink plenty of fluids (water, gatorade/powerade/pedialyte, juices, or teas) to keep your throat moisturized and help further relieve irritation/discomfort.

## 2023-05-17 NOTE — ED Triage Notes (Signed)
Patient presents to UC for cough and congestion x 1 week. Taking OTC cough med. States she attended a [employer] and sat under vent. Symptoms started after this event.   Denies fever.

## 2023-05-17 NOTE — ED Provider Notes (Signed)
Kristen Powell    CSN: [id number] Arrival date & time: 05/17/23  4098      History   Chief Complaint Chief Complaint  Patient presents with   Cough   Nasal Congestion    HPI Kristen Powell is a 77 y.o. female.   Patient presents for evaluation of nasal congestion, rhinorrhea and a productive cough with clear sputum present for 7 days.  Symptoms began after sitting under a air fit for 3 hours during a large funeral, no overt sick contacts.  Tolerating food and liquids.  Has attempted use of guaifenesin DM which has been somewhat helpful but symptoms have persisted.  Denies shortness of breath or wheezing.  Denies respiratory history, non-smoker.  Past Medical History:  Diagnosis Date   Anxiety    COVID-19    Diastolic dysfunction    a. 12/2021 Echo: EF 60-65%, no rwma, GrI DD, nl RV fxn, RVSP 36.72mmHg, mild MR.   Hyperlipidemia    Hypertension    Mild Mitral regurgitation    a. 12/2021 Echo: Mild MR.   Psoriasis    younger age   Spinal stenosis    Traumatic rupture of left ear drum     Patient Active Problem List   Diagnosis Date Noted   Atherosclerosis of native arteries of extremity with intermittent claudication (HCC) 03/31/2023   COVID-19 03/03/2023   Chronic pain of both feet 09/18/2022   Hyponatremia 02/04/2022   Colon cancer screening    Anxiety 06/20/2021   B12 deficiency due to diet 04/29/2021   Screening for blood or protein in urine 04/29/2021   S/P partial hysterectomy- has ovaries.  04/29/2021   Chronic sciatica of right side 08/07/2019   Vitamin D deficiency 07/27/2016   Generalized anxiety disorder 08/13/2014   Degenerative arthritis of knee 05/14/2014   Essential hypertension 01/05/2014   HLD (hyperlipidemia) 01/05/2014   GERD (gastroesophageal reflux disease) 01/05/2014    Past Surgical History:  Procedure Laterality Date   ABDOMINAL HYSTERECTOMY  12/14/1978   Partial   BASAL CELL CARCINOMA EXCISION  2006,2008,2011   BUNIONECTOMY      BUNIONECTOMY Bilateral 12/14/1990   COLONOSCOPY WITH PROPOFOL N/A 12/09/2021   Procedure: COLONOSCOPY WITH PROPOFOL;  Surgeon: Midge Minium, MD;  Location: [facility] ENDOSCOPY;  Service: Endoscopy;  Laterality: N/A;   KNEE ARTHROSCOPY     KNEE CLOSED REDUCTION  05/07/2015   Procedure: CLOSED MANIPULATION KNEE;  Surgeon: Kennedy Bucker, MD;  Location: [facility] ORS;  Service: Orthopedics;;   KNEE SURGERY Bilateral 2005,2006   Repair   MOHS SURGERY     nose bcc, left cheeck Dr. Meredith Mody f/u Q 12/2020   REPLACEMENT TOTAL KNEE Left 04/11/2015   manipulation--05/07/2015   ROTATOR CUFF REPAIR W/ DISTAL CLAVICLE EXCISION     SHOULDER SURGERY Right    x2   SKIN CANCER EXCISION     TOTAL KNEE ARTHROPLASTY     VAGINAL HYSTERECTOMY      OB History     Gravida  0   Para  0   Term  0   Preterm  0   AB  0   Living         SAB  0   IAB  0   Ectopic  0   Multiple      Live Births               Home Medications    Prior to Admission medications   Medication Sig Start Date End Date Taking? Authorizing Provider  azithromycin (ZITHROMAX) 250 MG tablet Take 1 tablet (250 mg total) by mouth daily. Take first 2 tablets together, then 1 every day until finished. 05/17/23  Yes Denarius Sesler R, NP  benzonatate (TESSALON) 100 MG capsule Take 1 capsule (100 mg total) by mouth every 8 (eight) hours. 05/17/23  Yes Jaisa Defino, Elita Boone, NP  acetaminophen (TYLENOL) 500 MG tablet Take 1-2 tablets (500-1,000 mg total) by mouth every 4 (four) hours as needed for fever. 04/15/15   Tera Partridge, PA  amLODipine (NORVASC) 10 MG tablet Take 1 tablet (10 mg total) by mouth daily. 04/19/23 04/13/24  Debbe Odea, MD  aspirin 81 MG tablet Take 81 mg by mouth daily.    [provider]  calcium carbonate (OS-CAL) 600 MG TABS tablet Take 600 mg by mouth daily as needed.    [provider]  carvedilol (COREG) 12.5 MG tablet Take 1 tablet (12.5 mg total) by mouth 2 (two) times daily with a meal. 02/19/23    Debbe Odea, MD  Cholecalciferol (VITAMIN D) 2000 UNITS CAPS Take by mouth daily. Patient is taking 3000 units instead of 2000    [provider]  citalopram (CELEXA) 10 MG tablet Take 1 tablet (10 mg total) by mouth daily. 03/22/23   Allegra Grana, FNP  fenofibrate (TRICOR) 145 MG tablet Take 1 tablet (145 mg total) by mouth daily. 10/26/22   Allegra Grana, FNP  gabapentin (NEURONTIN) 100 MG capsule TAKE 1 CAPSULE BY MOUTH AT BEDTIME. 08/19/22   Worthy Rancher B, FNP  LORazepam (ATIVAN) 0.5 MG tablet TAKE 0.5-1 TABLETS (0.25-0.5 MG TOTAL) BY MOUTH DAILY AS NEEDED FOR ANXIETY 07/29/22   Allegra Grana, FNP  losartan (COZAAR) 100 MG tablet TAKE 1 TABLET (100 MG TOTAL) BY MOUTH DAILY. D/C 50 MG 06/04/22   Worthy Rancher B, FNP  Omega-3 Fatty Acids (FISH OIL) 1200 MG CAPS Take 1 capsule by mouth 2 (two) times daily.    [provider]  omeprazole (PRILOSEC) 20 MG capsule Take 1 capsule (20 mg total) by mouth 2 (two) times daily. 10/26/22   Allegra Grana, FNP  simvastatin (ZOCOR) 20 MG tablet Take 1 tablet (20 mg total) by mouth daily. Take in evening 11/13/22   Allegra Grana, FNP  vitamin B-12 (CYANOCOBALAMIN) 1000 MCG tablet Take 1,000 mcg by mouth daily.    [provider]    Family History Family History  Problem Relation Age of Onset   Cancer Mother        lung   Hypertension Sister    Heart attack Sister 57   Breast cancer Paternal Aunt     Social History Social History   Tobacco Use   Smoking status: Never   Smokeless tobacco: Never  Vaping Use   Vaping Use: Never used  Substance Use Topics   Alcohol use: No   Drug use: No     Allergies   Hydrochlorothiazide and Adhesive [tape]   Review of Systems Review of Systems Defer to HPI    Physical Exam Triage Vital Signs ED Triage Vitals [05/17/23 0852]  Enc Vitals Group     BP (!) 145/56     Pulse Rate 73     Resp 16     Temp 97.8 F (36.6 C)     Temp Source  Temporal     SpO2 98 %     Weight      Height      Head Circumference  Peak Flow      Pain Score 0     Pain Loc      Pain Edu?      Excl. in GC?    No data found.  Updated Vital Signs BP (!) 145/56 (BP Location: Left Arm)   Pulse 73   Temp 97.8 F (36.6 C) (Temporal)   Resp 16   SpO2 98%   Visual Acuity Right Eye Distance:   Left Eye Distance:   Bilateral Distance:    Right Eye Near:   Left Eye Near:    Bilateral Near:     Physical Exam Constitutional:      Appearance: Normal appearance.  HENT:     Head: Normocephalic.     Right Ear: Tympanic membrane, ear canal and external ear normal.     Left Ear: Tympanic membrane, ear canal and external ear normal.     Nose: Congestion present. No rhinorrhea.     Mouth/Throat:     Mouth: Mucous membranes are moist.     Pharynx: Oropharynx is clear. No posterior oropharyngeal erythema.  Eyes:     Extraocular Movements: Extraocular movements intact.  Cardiovascular:     Rate and Rhythm: Normal rate and regular rhythm.     Pulses: Normal pulses.     Heart sounds: Normal heart sounds.  Pulmonary:     Effort: Pulmonary effort is normal.     Breath sounds: Normal breath sounds.  Neurological:     Mental Status: She is alert and oriented to person, place, and time. Mental status is at baseline.      UC Treatments / Results  Labs (all labs ordered are listed, but only abnormal results are displayed) Labs Reviewed - No data to display  EKG   Radiology No results found.  Procedures Procedures (including critical care time)  Medications Ordered in UC Medications - No data to display  Initial Impression / Assessment and Plan / UC Course  I have reviewed the triage vital signs and the nursing notes.  Pertinent labs & imaging results that were available during my care of the patient were reviewed by me and considered in my medical decision making (see chart for details).  Acute URI  Patient is in no signs of  distress nor toxic appearing.  Vital signs are stable.  Low suspicion for pneumonia, pneumothorax or bronchitis and therefore will defer imaging.  Viral testing deferred due to timeline of illness.  Symptoms have been present for 7 days without signs of resolution therefore we will provide bacterial coverage to the upper airway, prescribed azithromycin and Tessalon as cough is most worrisome symptom.May use additional over-the-counter medications as needed for supportive care.  May follow-up with urgent care as needed if symptoms persist or worsen.   Final Clinical Impressions(s) / UC Diagnoses   Final diagnoses:  Acute URI     Discharge Instructions      As your symptoms have been going on for 7 days without any signs of resolution we will provide bacterial coverage to ensure that this is not causing him to prolong  Take azithromycin as directed  You may take Tessalon pill every 8 hours as needed to help calm your coughing, you may take this in addition to the over-the-counter medication you have already purchased    You can take Tylenol and/or Ibuprofen as needed for fever reduction and pain relief.   For cough: honey 1/2 to 1 teaspoon (you can dilute the honey in water or another fluid).  You can also use guaifenesin and dextromethorphan for cough. You can use a humidifier for chest congestion and cough.  If you don't have a humidifier, you can sit in the bathroom with the hot shower running.      For sore throat: try warm salt water gargles, cepacol lozenges, throat spray, warm tea or water with lemon/honey, popsicles or ice, or OTC cold relief medicine for throat discomfort.   For congestion: take a daily anti-histamine like Zyrtec, Claritin, and a oral decongestant, such as pseudoephedrine.  You can also use Flonase 1-2 sprays in each nostril daily.   It is important to stay hydrated: drink plenty of fluids (water, gatorade/powerade/pedialyte, juices, or teas) to keep your throat  moisturized and help further relieve irritation/discomfort.    ED Prescriptions     Medication Sig Dispense Auth. Provider   azithromycin (ZITHROMAX) 250 MG tablet Take 1 tablet (250 mg total) by mouth daily. Take first 2 tablets together, then 1 every day until finished. 6 tablet Pavlos Yon R, NP   benzonatate (TESSALON) 100 MG capsule Take 1 capsule (100 mg total) by mouth every 8 (eight) hours. 21 capsule Declan Mier, Elita Boone, NP      PDMP not reviewed this encounter.   Valinda Hoar, [place] 05/17/23 [phone]

## 2023-05-19 ENCOUNTER — Telehealth: Payer: Self-pay | Admitting: Family

## 2023-05-19 NOTE — Telephone Encounter (Signed)
Patient called and stated that her pharmacy told her that Arnett did  not call in her citalopram (CELEXA) 10 MG tablet, she called in another medication that patient does not know the name of if, only the first letter T. Patient wanted to know if Jason Coop was changing her medication. Pharmacy is [facility] on [address]

## 2023-05-19 NOTE — Telephone Encounter (Signed)
Spoke to pt and informed her that the Celexa was refilled in April for a year supply She was recently prescribed Tessalon for cough if appears . Pt verbalized understanding

## 2023-05-20 ENCOUNTER — Other Ambulatory Visit: Payer: Self-pay | Admitting: Family

## 2023-05-20 DIAGNOSIS — K219 Gastro-esophageal reflux disease without esophagitis: Secondary | ICD-10-CM

## 2023-05-24 ENCOUNTER — Telehealth: Payer: Self-pay | Admitting: Family

## 2023-05-24 ENCOUNTER — Encounter: Payer: Self-pay | Admitting: Cardiology

## 2023-05-24 ENCOUNTER — Ambulatory Visit: Payer: Medicare Other | Attending: Cardiology | Admitting: Cardiology

## 2023-05-24 VITALS — BP 162/72 | HR 66 | Ht 59.0 in | Wt 168.8 lb

## 2023-05-24 DIAGNOSIS — I10 Essential (primary) hypertension: Secondary | ICD-10-CM | POA: Diagnosis not present

## 2023-05-24 DIAGNOSIS — I739 Peripheral vascular disease, unspecified: Secondary | ICD-10-CM | POA: Diagnosis not present

## 2023-05-24 DIAGNOSIS — E782 Mixed hyperlipidemia: Secondary | ICD-10-CM | POA: Diagnosis not present

## 2023-05-24 MED ORDER — ATORVASTATIN CALCIUM 40 MG PO TABS: 40.0000 mg | ORAL_TABLET | Freq: Every day | ORAL | 3 refills | Status: DC

## 2023-05-24 NOTE — Progress Notes (Signed)
Cardiology Office Note:    Date:  05/24/2023   ID:  Kristen Powell, DOB 12-20-1945, MRN [id number]  PCP:  Allegra Grana, FNP   [facility] Providers Cardiologist:  Debbe Odea, MD     Referring MD: Allegra Grana, FNP   Chief Complaint  Patient presents with   Follow-up    Patient denies new or acute cardiac problems/concerns today.      History of Present Illness:    Kristen Powell is a 77 y.o. female with a hx of hypertension, hyperlipidemia, PAD (75% right SFA stenosis), anxiety who presents for follow-up.    Feels well, denies chest pain or shortness of breath, compliant with medications as prescribed.  Blood pressures at home range in the 130s systolic.  Has dependent edema, followed up with vein and vascular service, lower extremity arterial ultrasound revealed distal right SFA stenosis about 75%.  Denies claudication.   Prior notes Echo 1/23 EF 60 to 65%, mild MR, aortic valve not well-visualized. Renal arterial ultrasound 07/2021 with no evidence for stenosis.  Past Medical History:  Diagnosis Date   Anxiety    COVID-19    Diastolic dysfunction    a. 12/2021 Echo: EF 60-65%, no rwma, GrI DD, nl RV fxn, RVSP 36.40mmHg, mild MR.   Hyperlipidemia    Hypertension    Mild Mitral regurgitation    a. 12/2021 Echo: Mild MR.   Psoriasis    younger age   Spinal stenosis    Traumatic rupture of left ear drum     Past Surgical History:  Procedure Laterality Date   ABDOMINAL HYSTERECTOMY  12/14/1978   Partial   BASAL CELL CARCINOMA EXCISION  2006,2008,2011   BUNIONECTOMY     BUNIONECTOMY Bilateral 12/14/1990   COLONOSCOPY WITH PROPOFOL N/A 12/09/2021   Procedure: COLONOSCOPY WITH PROPOFOL;  Surgeon: Midge Minium, MD;  Location: [facility] END[facility]COPY;  Service: Endoscopy;  Laterality: N/A;   KNEE ARTHROSCOPY     KNEE CLOSED REDUCTION  05/07/2015   Procedure: CLOSED MANIPULATION KNEE;  Surgeon: Kennedy Bucker, MD;  Location: [facility] ORS;  Service:  Orthopedics;;   KNEE SURGERY Bilateral 2005,2006   Repair   MOHS SURGERY     nose bcc, left cheeck Dr. Meredith Mody f/u Q 12/2020   REPLACEMENT TOTAL KNEE Left 04/11/2015   manipulation--05/07/2015   ROTATOR CUFF REPAIR W/ DISTAL CLAVICLE EXCISION     SHOULDER SURGERY Right    x2   SKIN CANCER EXCISION     TOTAL KNEE ARTHROPLASTY     VAGINAL HYSTERECTOMY      Current Medications: Current Meds  Medication Sig   acetaminophen (TYLENOL) 500 MG tablet Take 1-2 tablets (500-1,000 mg total) by mouth every 4 (four) hours as needed for fever.   amLODipine (NORVASC) 10 MG tablet Take 1 tablet (10 mg total) by mouth daily.   aspirin 81 MG tablet Take 81 mg by mouth daily.   atorvastatin (LIPITOR) 40 MG tablet Take 1 tablet (40 mg total) by mouth daily.   calcium carbonate (OS-CAL) 600 MG TABS tablet Take 600 mg by mouth daily as needed.   carvedilol (COREG) 12.5 MG tablet Take 1 tablet (12.5 mg total) by mouth 2 (two) times daily with a meal.   Cholecalciferol (VITAMIN D) 2000 UNITS CAPS Take by mouth daily. Patient is taking 3000 units instead of 2000   citalopram (CELEXA) 10 MG tablet Take 1 tablet (10 mg total) by mouth daily.   fenofibrate (TRICOR) 145 MG tablet Take 1 tablet (145 mg  total) by mouth daily.   gabapentin (NEURONTIN) 100 MG capsule TAKE 1 CAPSULE BY MOUTH AT BEDTIME.   LORazepam (ATIVAN) 0.5 MG tablet TAKE 0.5-1 TABLETS (0.25-0.5 MG TOTAL) BY MOUTH DAILY AS NEEDED FOR ANXIETY   losartan (COZAAR) 100 MG tablet TAKE 1 TABLET (100 MG TOTAL) BY MOUTH DAILY. D/C 50 MG   Omega-3 Fatty Acids (FISH OIL) 1200 MG CAPS Take 1 capsule by mouth 2 (two) times daily.   omeprazole (PRILOSEC) 20 MG capsule TAKE 1 CAPSULE BY MOUTH TWICE A DAY   vitamin B-12 (CYANOCOBALAMIN) 1000 MCG tablet Take 1,000 mcg by mouth daily.   [DISCONTINUED] simvastatin (ZOCOR) 20 MG tablet Take 1 tablet (20 mg total) by mouth daily. Take in evening     Allergies:   Hydrochlorothiazide and Adhesive [tape]   Social  History   Socioeconomic History   Marital status: Widowed    Spouse name: Not on file   Number of children: Not on file   Years of education: Not on file   Highest education level: Not on file  Occupational History   Not on file  Tobacco Use   Smoking status: Never   Smokeless tobacco: Never  Vaping Use   Vaping Use: Never used  Substance and Sexual Activity   Alcohol use: No   Drug use: No   Sexual activity: Never  Other Topics Concern   Not on file  Social History Narrative   Widowed x 12 years as of 06/2021    3 sisters and 1 brother    Has children son age 19, daughter 65, son 22 as of 06/2021   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      44 great grandchildren         Social Determinants of Health   Financial Resource Strain: Low Risk  (08/21/2022)   Overall Financial Resource Strain (CARDIA)    Difficulty of Paying Living Expenses: Not hard at all  Food Insecurity: No Food Insecurity (08/21/2022)   Hunger Vital Sign    Worried About Running Out of Food in the Last Year: Never true    Ran Out of Food in the Last Year: Never true  Transportation Needs: No Transportation Needs (08/21/2022)   PRAPARE - Administrator, Civil Service (Medical): No    Lack of Transportation (Non-Medical): No  Physical Activity: Sufficiently Active (08/21/2022)   Exercise Vital Sign    Days of Exercise per Week: 5 days    Minutes of Exercise per Session: 30 min  Stress: No Stress Concern Present (08/21/2022)   [employer] of Occupational Health - Occupational Stress Questionnaire    Feeling of Stress : Not at all  Social Connections: Unknown (08/21/2022)   Social Connection and Isolation Panel [NHANES]    Frequency of Communication with Friends and Family: More than three times a week    Frequency of Social Gatherings with Friends and Family: More than three times a week    Attends Religious Services: More than 4 times per year    Active Member of [employer] or Organizations: Not on  file    Attends Engineer, structural: More than 4 times per year    Marital Status: Not on file     Family History: The patient's family history includes Breast cancer in her paternal aunt; Cancer in her mother; Heart attack (age of onset: 38) in her sister; Hypertension in her sister.  ROS:   Please see the history of present illness.  All other systems reviewed and are negative.  EKGs/Labs/Other Studies Reviewed:    The following studies were reviewed today:   EKG:  EKG is ordered today.  EKG shows normal sinus rhythm, heart rate 66.  Recent Labs: 09/18/2022: ALT 20; Hemoglobin 11.3; Platelets 258.0 12/22/2022: BUN 25; Creatinine, Ser 1.07; Potassium 4.8; Sodium 134  Recent Lipid Panel    Component Value Date/Time   CHOL 176 02/19/2023 1033   CHOL 183 12/19/2021 0814   TRIG 128 02/19/2023 1033   HDL 50 02/19/2023 1033   HDL 48 12/19/2021 0814   CHOLHDL 3.5 02/19/2023 1033   VLDL 26 02/19/2023 1033   LDLCALC 100 (H) 02/19/2023 1033   LDLCALC 96 12/19/2021 0814   LDLDIRECT 123.0 04/29/2021 0940     Risk Assessment/Calculations:          Physical Exam:    VS:  BP (!) 162/72 (BP Location: Left Arm, Patient Position: Sitting, Cuff Size: Normal)   Pulse 66   Ht 4\' 11"  (1.499 m)   Wt 168 lb 12.8 oz (76.6 kg)   SpO2 98%   BMI 34.09 kg/m     Wt Readings from Last 3 Encounters:  05/24/23 168 lb 12.8 oz (76.6 kg)  03/30/23 167 lb 9.6 oz (76 kg)  03/22/23 168 lb 6.4 oz (76.4 kg)     GEN:  Well nourished, well developed in no acute distress HEENT: Normal NECK: No JVD; No carotid bruits CARDIAC: RRR, 2/6 systolic murmur RESPIRATORY:  Clear to auscultation without rales, wheezing or rhonchi  ABDOMEN: Soft, non-tender, non-distended MUSCULOSKELETAL: Trace edema; varicose veins SKIN: Warm and dry NEUROLOGIC:  Alert and oriented x 3 PSYCHIATRIC:  Normal affect   ASSESSMENT:    1. White coat syndrome with diagnosis of hypertension   2. Mixed  hyperlipidemia   3. PAD (peripheral artery disease) (HCC)    PLAN:    In order of problems listed above:  Hypertension, BP elevated better controlled at home with systolics in the 130s.  Component of whitecoat syndrome.  Continue Coreg 12.5 mg twice daily, Norvasc 10 mg daily, losartan 100 mg daily.  History of hyponatremia with HCTZ. Hyperlipidemia, cholesterol controlled but not at goal.  Start Lipitor 40 mg daily, continue fenofibrate 145 mg daily. PAD, right SFA stenosis, denies claudication.  Aspirin 81 mg, Lipitor 40 as above.  Follows up with vascular surgery.  Follow-up in 6 months.      Medication Adjustments/Labs and Tests Ordered: Current medicines are reviewed at length with the patient today.  Concerns regarding medicines are outlined above.  Orders Placed This Encounter  Procedures   EKG 12-Lead   Meds ordered this encounter  Medications   atorvastatin (LIPITOR) 40 MG tablet    Sig: Take 1 tablet (40 mg total) by mouth daily.    Dispense:  90 tablet    Refill:  3    Patient Instructions  Medication Instructions:   START Atorvastatin - Take one tablet ( 40mg ) by mouth daily.  2. STOP Simvastatin   *If you need a refill on your cardiac medications before your next appointment, please call your pharmacy*   Lab Work:  None Ordered  If you have labs (blood work) drawn today and your tests are completely normal, you will receive your results only by: MyChart Message (if you have MyChart) OR A paper copy in the mail If you have any lab test that is abnormal or we need to change your treatment, we will call you to review the results.  Testing/Procedures:  None Ordered    Follow-Up: At [facility], you and your health needs are our priority.  As part of our continuing mission to provide you with exceptional heart care, we have created designated Provider Care Teams.  These Care Teams include your primary Cardiologist (physician) and Advanced  Practice Providers (APPs -  Physician Assistants and Nurse Practitioners) who all work together to provide you with the care you need, when you need it.  We recommend signing up for the patient portal called "MyChart".  Sign up information is provided on this After Visit Summary.  MyChart is used to connect with patients for Virtual Visits (Telemedicine).  Patients are able to view lab/test results, encounter notes, upcoming appointments, etc.  Non-urgent messages can be sent to your provider as well.   To learn more about what you can do with MyChart, go to [email or web address].    Your next appointment:   6 month(s)  Provider:   You may see Debbe Odea, MD or one of the following Advanced Practice Providers on your designated Care Team:   Nicolasa Ducking, NP Eula Listen, PA-C Cadence Fransico Michael, PA-C Charlsie Quest, NP   Signed, Debbe Odea, MD  05/24/2023 9:56 AM    [facility]

## 2023-05-24 NOTE — Patient Instructions (Signed)
Medication Instructions:   START Atorvastatin - Take one tablet ( 40mg ) by mouth daily.  2. STOP Simvastatin   *If you need a refill on your cardiac medications before your next appointment, please call your pharmacy*   Lab Work:  None Ordered  If you have labs (blood work) drawn today and your tests are completely normal, you will receive your results only by: MyChart Message (if you have MyChart) OR A paper copy in the mail If you have any lab test that is abnormal or we need to change your treatment, we will call you to review the results.   Testing/Procedures:  None Ordered    Follow-Up: At [facility], you and your health needs are our priority.  As part of our continuing mission to provide you with exceptional heart care, we have created designated Provider Care Teams.  These Care Teams include your primary Cardiologist (physician) and Advanced Practice Providers (APPs -  Physician Assistants and Nurse Practitioners) who all work together to provide you with the care you need, when you need it.  We recommend signing up for the patient portal called "MyChart".  Sign up information is provided on this After Visit Summary.  MyChart is used to connect with patients for Virtual Visits (Telemedicine).  Patients are able to view lab/test results, encounter notes, upcoming appointments, etc.  Non-urgent messages can be sent to your provider as well.   To learn more about what you can do with MyChart, go to [email or web address].    Your next appointment:   6 month(s)  Provider:   You may see Debbe Odea, MD or one of the following Advanced Practice Providers on your designated Care Team:   Nicolasa Ducking, NP Eula Listen, PA-C Cadence Fransico Michael, PA-C Charlsie Quest, NP

## 2023-05-31 NOTE — Telephone Encounter (Signed)
Prescription Request  05/31/2023  LOV: Visit date not found  What is the name of the medication or equipment? lorsartan  Have you contacted your pharmacy to request a refill? Yes   Which pharmacy would you like this sent to?  [id number] [address] [place] [address] Phone: [phone] Fax: [phone]    Patient notified that their request is being sent to the clinical staff for review and that they should receive a response within 2 business days.   Please advise at Mobile [phone] (mobile)

## 2023-06-01 NOTE — Telephone Encounter (Signed)
Rx sent in to pharmacy. 

## 2023-06-27 ENCOUNTER — Ambulatory Visit
Admission: EM | Admit: 2023-06-27 | Discharge: 2023-06-27 | Disposition: A | Discharge status: Home / Self Care | Payer: Medicare Other | Attending: Family Medicine | Admitting: Family Medicine

## 2023-06-27 ENCOUNTER — Encounter: Payer: Self-pay | Admitting: Emergency Medicine

## 2023-06-27 DIAGNOSIS — L255 Unspecified contact dermatitis due to plants, except food: Secondary | ICD-10-CM

## 2023-06-27 MED ORDER — TRIAMCINOLONE ACETONIDE 0.025 % EX CREA: 1.0000 | TOPICAL_CREAM | Freq: Three times a day (TID) | CUTANEOUS | 0 refills | Status: DC | PRN

## 2023-06-27 MED ORDER — DEXAMETHASONE SODIUM PHOSPHATE 10 MG/ML IJ SOLN
10.0000 mg | Freq: Once | INTRAMUSCULAR | Status: AC
  Administered 2023-06-27: 10 mg via INTRAMUSCULAR

## 2023-06-27 NOTE — Discharge Instructions (Addendum)
You received a steroid injection to treat the skin reaction. I am prescribing triamcinolone cream to apply to your skin up to 3 times daily as needed for itching.

## 2023-06-27 NOTE — ED Provider Notes (Signed)
[facility]    CSN: [id number] Arrival date & time: 06/27/23  1307      History   Chief Complaint No chief complaint on file.   HPI Kristen Powell is a 77 y.o. female.   HPI Rash Here with a rash to her bilateral lower legs and buttocks x 1 week and is concerned that she may have poison ivy.  She reports that symptoms started after being outside working in the yard.  She had previous poison ivy outbreaks this appears similar as previous outbreaks in the past.  Is any other portions of her body has been affected with rash. Denies any other complaints.  Past Medical History:  Diagnosis Date   Anxiety    COVID-19    Diastolic dysfunction    a. 12/2021 Echo: EF 60-65%, no rwma, GrI DD, nl RV fxn, RVSP 36.63mmHg, mild MR.   Hyperlipidemia    Hypertension    Mild Mitral regurgitation    a. 12/2021 Echo: Mild MR.   Psoriasis    younger age   Spinal stenosis    Traumatic rupture of left ear drum     Patient Active Problem List   Diagnosis Date Noted   Atherosclerosis of native arteries of extremity with intermittent claudication (HCC) 03/31/2023   COVID-19 03/03/2023   Chronic pain of both feet 09/18/2022   Hyponatremia 02/04/2022   Colon cancer screening    Anxiety 06/20/2021   B12 deficiency due to diet 04/29/2021   Screening for blood or protein in urine 04/29/2021   S/P partial hysterectomy- has ovaries.  04/29/2021   Chronic sciatica of right side 08/07/2019   Vitamin D deficiency 07/27/2016   Generalized anxiety disorder 08/13/2014   Degenerative arthritis of knee 05/14/2014   Essential hypertension 01/05/2014   HLD (hyperlipidemia) 01/05/2014   GERD (gastroesophageal reflux disease) 01/05/2014    Past Surgical History:  Procedure Laterality Date   ABDOMINAL HYSTERECTOMY  12/14/1978   Partial   BASAL CELL CARCINOMA EXCISION  2006,2008,2011   BUNIONECTOMY     BUNIONECTOMY Bilateral 12/14/1990   COLONOSCOPY WITH PROPOFOL N/A 12/09/2021    Procedure: COLONOSCOPY WITH PROPOFOL;  Surgeon: Midge Minium, MD;  Location[facility] END[facility]COPY;  Service: Endoscopy;  Laterality: N/A;   KNEE ARTHROSCOPY     KNEE CLOSED REDUCTION  05/07/2015   Procedure: CLOSED MANIPULATION KNEE;  Surgeon: Kennedy Bucker, MD;  Location: [facility] ORS;  Service: Orthopedics;;   KNEE SURGERY Bilateral 2005,2006   Repair   MOHS SURGERY     nose bcc, left cheeck Dr. Meredith Mody f/u Q 12/2020   REPLACEMENT TOTAL KNEE Left 04/11/2015   manipulation--05/07/2015   ROTATOR CUFF REPAIR W/ DISTAL CLAVICLE EXCISION     SHOULDER SURGERY Right    x2   SKIN CANCER EXCISION     TOTAL KNEE ARTHROPLASTY     VAGINAL HYSTERECTOMY      OB History     Gravida  0   Para  0   Term  0   Preterm  0   AB  0   Living         SAB  0   IAB  0   Ectopic  0   Multiple      Live Births               Home Medications    Prior to Admission medications   Medication Sig Start Date End Date Taking? Authorizing Provider  triamcinolone (KENALOG) 0.025 % cream Apply 1 Application topically  3 (three) times daily as needed. 06/27/23  Yes Bing Neighbors, NP  acetaminophen (TYLENOL) 500 MG tablet Take 1-2 tablets (500-1,000 mg total) by mouth every 4 (four) hours as needed for fever. 04/15/15   Tera Partridge, PA  amLODipine (NORVASC) 10 MG tablet Take 1 tablet (10 mg total) by mouth daily. 04/19/23 04/13/24  Debbe Odea, MD  aspirin 81 MG tablet Take 81 mg by mouth daily.    [provider]  atorvastatin (LIPITOR) 40 MG tablet Take 1 tablet (40 mg total) by mouth daily. 05/24/23 08/22/23  Debbe Odea, MD  calcium carbonate (OS-CAL) 600 MG TABS tablet Take 600 mg by mouth daily as needed.    [provider]  carvedilol (COREG) 12.5 MG tablet Take 1 tablet (12.5 mg total) by mouth 2 (two) times daily with a meal. 06/29/23   Debbe Odea, MD  Cholecalciferol (VITAMIN D) 2000 UNITS CAPS Take by mouth daily. Patient is taking 3000 units instead of 2000     [provider]  citalopram (CELEXA) 10 MG tablet Take 1 tablet (10 mg total) by mouth daily. 03/22/23   Allegra Grana, FNP  fenofibrate (TRICOR) 145 MG tablet Take 1 tablet (145 mg total) by mouth daily. 10/26/22   Allegra Grana, FNP  gabapentin (NEURONTIN) 100 MG capsule TAKE 1 CAPSULE BY MOUTH AT BEDTIME. 08/19/22   Worthy Rancher B, FNP  LORazepam (ATIVAN) 0.5 MG tablet TAKE 0.5-1 TABLETS (0.25-0.5 MG TOTAL) BY MOUTH DAILY AS NEEDED FOR ANXIETY 07/29/22   Allegra Grana, FNP  losartan (COZAAR) 100 MG tablet TAKE 1 TABLET (100 MG TOTAL) BY MOUTH DAILY. D/C 50 MG 06/01/23   Allegra Grana, FNP  Omega-3 Fatty Acids (FISH OIL) 1200 MG CAPS Take 1 capsule by mouth 2 (two) times daily.    [provider]  omeprazole (PRILOSEC) 20 MG capsule TAKE 1 CAPSULE BY MOUTH TWICE A DAY 05/20/23   Allegra Grana, FNP  vitamin B-12 (CYANOCOBALAMIN) 1000 MCG tablet Take 1,000 mcg by mouth daily.    [provider]    Family History Family History  Problem Relation Age of Onset   Cancer Mother        lung   Hypertension Sister    Heart attack Sister 58   Breast cancer Paternal Aunt     Social History Social History   Tobacco Use   Smoking status: Never   Smokeless tobacco: Never  Vaping Use   Vaping status: Never Used  Substance Use Topics   Alcohol use: No   Drug use: No     Allergies   Hydrochlorothiazide and Adhesive [tape]   Review of Systems Review of Systems Pertinent negatives listed in HPI  Physical Exam Triage Vital Signs ED Triage Vitals  Encounter Vitals Group     BP 06/27/23 1328 (!) 150/74     Systolic BP Percentile --      Diastolic BP Percentile --      Pulse Rate 06/27/23 1328 71     Resp 06/27/23 1328 18     Temp --      Temp Source 06/27/23 1328 Oral     SpO2 06/27/23 1328 97 %     Weight 06/27/23 1324 167 lb (75.8 kg)     Height 06/27/23 1324 4\' 11"  (1.499 m)     Head Circumference --      Peak Flow --      Pain  Score 06/27/23 1324 0  Pain Loc --      Pain Education --      Exclude from Growth Chart --    No data found.  Updated Vital Signs BP (!) 150/74 (BP Location: Right Arm)   Pulse 71   Resp 18   Ht 4\' 11"  (1.499 m)   Wt 167 lb (75.8 kg)   SpO2 97%   BMI 33.73 kg/m   Visual Acuity Right Eye Distance:   Left Eye Distance:   Bilateral Distance:    Right Eye Near:   Left Eye Near:    Bilateral Near:     Physical Exam Vitals reviewed.  Constitutional:      Appearance: Normal appearance.  HENT:     Head: Normocephalic and atraumatic.  Eyes:     Extraocular Movements: Extraocular movements intact.     Pupils: Pupils are equal, round, and reactive to light.  Cardiovascular:     Rate and Rhythm: Normal rate and regular rhythm.  Pulmonary:     Effort: Pulmonary effort is normal.     Breath sounds: Normal breath sounds.  Musculoskeletal:     Cervical back: Normal range of motion and neck supple.  Skin:    Findings: Rash present. Rash is macular and papular.  Neurological:     General: No focal deficit present.     Mental Status: She is alert.  Psychiatric:        Mood and Affect: Mood is anxious.      UC Treatments / Results  Labs (all labs ordered are listed, but only abnormal results are displayed) Labs Reviewed - No data to display  EKG   Radiology No results found.  Procedures Procedures (including critical care time)  Medications Ordered in UC Medications  dexamethasone (DECADRON) injection 10 mg (10 mg Intramuscular Given 06/27/23 1359)    Initial Impression / Assessment and Plan / UC Course  I have reviewed the triage vital signs and the nursing notes.  Pertinent labs & imaging results that were available during my care of the patient were reviewed by me and considered in my medical decision making (see chart for details).    Contact dermatitis likely from poison ivy exposure, Decadron 10 mg IM given here in clinic.  Patient will continue  treatment at home with topical triamcinolone cream will defer prescribing oral prednisone given patient has a history of significant anxiety as this may worsen those symptoms.  Patient advised to return if her symptoms do not resolve with treatment here in clinic. Final Clinical Impressions(s) / UC Diagnoses   Final diagnoses:  Contact dermatitis due to plant     Discharge Instructions      You received a steroid injection to treat the skin reaction. I am prescribing triamcinolone cream to apply to your skin up to 3 times daily as needed for itching.      ED Prescriptions     Medication Sig Dispense Auth. Provider   triamcinolone (KENALOG) 0.025 % cream Apply 1 Application topically 3 (three) times daily as needed. 465 g Bing Neighbors, NP      PDMP not reviewed this encounter.   Bing Neighbors, NP 06/30/23 [phone]

## 2023-06-27 NOTE — ED Triage Notes (Signed)
Patient in office today c/o poison ivy or oak itching and irritable bilateral back of leg x6d  OTC: iv dry liquid  Deinies fever, n/v

## 2023-06-28 ENCOUNTER — Other Ambulatory Visit: Payer: Self-pay | Admitting: Family

## 2023-06-28 DIAGNOSIS — I10 Essential (primary) hypertension: Secondary | ICD-10-CM

## 2023-06-29 ENCOUNTER — Other Ambulatory Visit: Payer: Self-pay

## 2023-06-29 MED ORDER — CARVEDILOL 12.5 MG PO TABS: 12.5000 mg | ORAL_TABLET | Freq: Two times a day (BID) | ORAL | 1 refills | Status: DC

## 2023-06-30 NOTE — Telephone Encounter (Signed)
Dr Sandie Ano prescribes coreg 12.5mg 

## 2023-07-07 ENCOUNTER — Ambulatory Visit (INDEPENDENT_AMBULATORY_CARE_PROVIDER_SITE_OTHER): Payer: Medicare Other | Admitting: Internal Medicine

## 2023-07-07 ENCOUNTER — Encounter: Payer: Self-pay | Admitting: Internal Medicine

## 2023-07-07 VITALS — BP 130/68 | HR 71 | Ht 59.0 in | Wt 167.2 lb

## 2023-07-07 DIAGNOSIS — F411 Generalized anxiety disorder: Secondary | ICD-10-CM

## 2023-07-07 DIAGNOSIS — F419 Anxiety disorder, unspecified: Secondary | ICD-10-CM | POA: Diagnosis not present

## 2023-07-07 DIAGNOSIS — L255 Unspecified contact dermatitis due to plants, except food: Secondary | ICD-10-CM | POA: Diagnosis not present

## 2023-07-07 MED ORDER — TRIAMCINOLONE ACETONIDE 0.5 % EX OINT: 1.0000 | TOPICAL_OINTMENT | Freq: Two times a day (BID) | CUTANEOUS | 2 refills | Status: DC

## 2023-07-07 MED ORDER — LORAZEPAM 0.5 MG PO TABS
0.2500 mg | ORAL_TABLET | Freq: Every day | ORAL | 2 refills | Status: DC | PRN
Start: 2023-07-07 — End: 2024-03-21

## 2023-07-07 MED ORDER — PREDNISONE 20 MG PO TABS
20.0000 mg | ORAL_TABLET | Freq: Every day | ORAL | 0 refills | Status: DC
Start: 2023-07-07 — End: 2023-08-24

## 2023-07-07 NOTE — Patient Instructions (Signed)
You need to start generic Allegra , available generically as fexofenadine ;  take it every 6 hours as needed for the itching  Change the cream to the ointment and use twice daily   Wrap  the affected leg in saran wrap after you apply the evening  dose to increase contact time with skin     20 mg prednisone taken daily in the morning for 5 days

## 2023-07-07 NOTE — Progress Notes (Signed)
Subjective:  Patient ID: Kristen Powell, female    DOB: May 21, 1946  Age: 77 y.o. MRN: [id number]  CC: The primary encounter diagnosis was Generalized anxiety disorder. Diagnoses of Anxiety and Contact dermatitis due to plant were also pertinent to this visit.   HPI Kristen Powell presents for  Chief Complaint  Patient presents with   Poison Ivy    X 2 weeks   1)  Contact dermatitis  secondary to fall onto poison ivy occurred on June 7 when she fell while trying to pull a gllen tree limb off of her fence.  She reports falling face first onto the ground and bruised her face but did not not seek medication  attention until Jun 14  due to persistent pruritic  rash on buttocks and right leg.  She was treated with IM decadron and rx'd triamcinolone cream.  Advised to take antihistamine.  Has not been taking an anthistamine bt using the cream and he rash is improving  but slowly and still very pruritic .  The left cheekbone is still tender to palpation but now with chewing or opening o mouth.   2) GAD:  needs refill on lorazepam. Uses it sparingly.  Takes celexa daily.  Refill history confirmed via [place] Controlled Substance databas, accessed by me today.. .  Refills have expired (last refill Sept 2023 per [place] database.   3) Hypertension: patient checks blood pressure twice weekly at home.  Readings have been for the most part <130/80 at rest . Patient is following a reduced salt diet most days and is taking medications as prescribed   Outpatient Medications Prior to Visit  Medication Sig Dispense Refill   acetaminophen (TYLENOL) 500 MG tablet Take 1-2 tablets (500-1,000 mg total) by mouth every 4 (four) hours as needed for fever. 30 tablet 0   amLODipine (NORVASC) 10 MG tablet Take 1 tablet (10 mg total) by mouth daily. 90 tablet 0   aspirin 81 MG tablet Take 81 mg by mouth daily.     atorvastatin (LIPITOR) 40 MG tablet Take 1 tablet (40 mg total) by mouth daily. 90 tablet 3   calcium  carbonate (OS-CAL) 600 MG TABS tablet Take 600 mg by mouth daily as needed.     carvedilol (COREG) 12.5 MG tablet Take 1 tablet (12.5 mg total) by mouth 2 (two) times daily with a meal. 180 tablet 1   Cholecalciferol (VITAMIN D) 2000 UNITS CAPS Take by mouth daily. Patient is taking 3000 units instead of 2000     citalopram (CELEXA) 10 MG tablet Take 1 tablet (10 mg total) by mouth daily. 90 tablet 3   fenofibrate (TRICOR) 145 MG tablet Take 1 tablet (145 mg total) by mouth daily. 90 tablet 3   gabapentin (NEURONTIN) 100 MG capsule TAKE 1 CAPSULE BY MOUTH AT BEDTIME. 90 capsule 3   losartan (COZAAR) 100 MG tablet TAKE 1 TABLET (100 MG TOTAL) BY MOUTH DAILY. D/C 50 MG 90 tablet 3   Omega-3 Fatty Acids (FISH OIL) 1200 MG CAPS Take 1 capsule by mouth 2 (two) times daily.     omeprazole (PRILOSEC) 20 MG capsule TAKE 1 CAPSULE BY MOUTH TWICE A DAY 180 capsule 3   triamcinolone (KENALOG) 0.025 % cream Apply 1 Application topically 3 (three) times daily as needed. 465 g 0   vitamin B-12 (CYANOCOBALAMIN) 1000 MCG tablet Take 1,000 mcg by mouth daily.     LORazepam (ATIVAN) 0.5 MG tablet TAKE 0.5-1 TABLETS (0.25-0.5 MG TOTAL) BY MOUTH DAILY  AS NEEDED FOR ANXIETY 30 tablet 2   No facility-administered medications prior to visit.    Review of Systems;  Patient denies headache, fevers, malaise, unintentional weight loss, skin rash, eye pain, sinus congestion and sinus pain, sore throat, dysphagia,  hemoptysis , cough, dyspnea, wheezing, chest pain, palpitations, orthopnea, edema, abdominal pain, nausea, melena, diarrhea, constipation, flank pain, dysuria, hematuria, urinary  Frequency, nocturia, numbness, tingling, seizures,  Focal weakness, Loss of consciousness,  Tremor, insomnia, depression, anxiety, and suicidal ideation.      Objective:  BP 130/68   Pulse 71   Ht 4\' 11"  (1.499 m)   Wt 167 lb 3.2 oz (75.8 kg)   SpO2 98%   BMI 33.77 kg/m   BP Readings from Last 3 Encounters:  07/07/23 130/68   06/27/23 (!) 150/74  05/24/23 (!) 162/72    Wt Readings from Last 3 Encounters:  07/07/23 167 lb 3.2 oz (75.8 kg)  06/27/23 167 lb (75.8 kg)  05/24/23 168 lb 12.8 oz (76.6 kg)    Physical Exam Skin:    Findings: Rash present. Rash is macular and papular.         Lab Results  Component Value Date   HGBA1C 6.0 09/18/2022   HGBA1C 5.8 03/12/2016    Lab Results  Component Value Date   CREATININE 1.07 12/22/2022   CREATININE 0.88 09/18/2022   CREATININE 1.03 06/30/2022    Lab Results  Component Value Date   WBC 5.8 09/18/2022   HGB 11.3 (L) 09/18/2022   HCT 33.7 (L) 09/18/2022   PLT 258.0 09/18/2022   GLUCOSE 90 12/22/2022   CHOL 176 02/19/2023   TRIG 128 02/19/2023   HDL 50 02/19/2023   LDLDIRECT 123.0 04/29/2021   LDLCALC 100 (H) 02/19/2023   ALT 20 09/18/2022   AST 19 09/18/2022   NA 134 (L) 12/22/2022   K 4.8 12/22/2022   CL 99 12/22/2022   CREATININE 1.07 12/22/2022   BUN 25 (H) 12/22/2022   CO2 24 12/22/2022   TSH 3.18 02/26/2022   INR 1.0 03/27/2015   HGBA1C 6.0 09/18/2022    No results found.  Assessment & Plan:  .Generalized anxiety disorder Assessment & Plan: Chronic, stable.  Continue Celexa 10 mg, rare use of lorazepam  (last refill Sept 2023)  .   I have refilled today I looked up patient on [place] Controlled Substances Reporting System PMP AWARE and saw no activity that raised concern of inappropriate use.    Anxiety -     LORazepam; Take 0.5-1 tablets (0.25-0.5 mg total) by mouth daily as needed for anxiety.  Dispense: 30 tablet; Refill: 2  Contact dermatitis due to plant Assessment & Plan: Persistent itching and macular rash on right  leg since June 7.  Has not been taking a daily anthistamine as rec by urgent Care MD>  jsut using triamcinolone cream 0.025% which ad to be special ordered.  Will rx prednisone 20 mg daily x 5,  start generic allegra (use up to 4 times daily as needed for itching ) and change triamcinolone to 0.5% ointment     Other orders -     Triamcinolone Acetonide; Apply 1 Application topically 2 (two) times daily.  Dispense: 15 g; Refill: 2 -     predniSONE; Take 1 tablet (20 mg total) by mouth daily with breakfast.  Dispense: 5 tablet; Refill: 0      Follow-up: No follow-ups on file.   Sherlene Shams, MD

## 2023-07-07 NOTE — Assessment & Plan Note (Signed)
Persistent itching and macular rash on right  leg since June 7.  Has not been taking a daily anthistamine as rec by urgent Care MD>  jsut using triamcinolone cream 0.025% which ad to be special ordered.  Will rx prednisone 20 mg daily x 5,  start generic allegra (use up to 4 times daily as needed for itching ) and change triamcinolone to 0.5% ointment

## 2023-07-07 NOTE — Assessment & Plan Note (Signed)
Chronic, stable.  Continue Celexa 10 mg, rare use of lorazepam  (last refill Sept 2023)  .   I have refilled today I looked up patient on [place] Controlled Substances Reporting System PMP AWARE and saw no activity that raised concern of inappropriate use.

## 2023-07-29 ENCOUNTER — Encounter (INDEPENDENT_AMBULATORY_CARE_PROVIDER_SITE_OTHER): Payer: Self-pay

## 2023-08-23 ENCOUNTER — Other Ambulatory Visit: Payer: Self-pay | Admitting: Family

## 2023-08-23 DIAGNOSIS — I10 Essential (primary) hypertension: Secondary | ICD-10-CM

## 2023-08-23 DIAGNOSIS — E782 Mixed hyperlipidemia: Secondary | ICD-10-CM

## 2023-08-24 ENCOUNTER — Ambulatory Visit (INDEPENDENT_AMBULATORY_CARE_PROVIDER_SITE_OTHER): Payer: Medicare Other | Admitting: *Deleted

## 2023-08-24 ENCOUNTER — Encounter: Payer: Medicare Other | Admitting: *Deleted

## 2023-08-24 ENCOUNTER — Other Ambulatory Visit: Payer: Self-pay

## 2023-08-24 VITALS — BP 146/55 | HR 65 | Ht 59.0 in | Wt 167.0 lb

## 2023-08-24 DIAGNOSIS — Z0000 Encounter for general adult medical examination without abnormal findings: Secondary | ICD-10-CM

## 2023-08-24 MED ORDER — AMLODIPINE BESYLATE 10 MG PO TABS: 10.0000 mg | ORAL_TABLET | Freq: Every day | ORAL | 3 refills | Status: DC

## 2023-08-24 NOTE — Patient Instructions (Signed)
Kristen Powell , Thank you for taking time to come for your Medicare Wellness Visit. I appreciate your ongoing commitment to your health goals. Please review the following plan we discussed and let me know if I can assist you in the future.   Referrals/Orders/Follow-Ups/Clinician Recommendations: None  This is a list of the screening recommended for you and due dates:  Health Maintenance  Topic Date Due   Flu Shot  07/15/2023   COVID-19 Vaccine (5 - 2023-24 season) 08/15/2023   DTaP/Tdap/Td vaccine (2 - Tdap) 01/06/2024   Mammogram  01/09/2024   Medicare Annual Wellness Visit  08/23/2024   Pneumonia Vaccine  Completed   DEXA scan (bone density measurement)  Completed   Hepatitis C Screening  Completed   Zoster (Shingles) Vaccine  Completed   HPV Vaccine  Aged Out   Colon Cancer Screening  Discontinued    Advanced directives: (Copy Requested) Please bring a copy of your health care power of attorney and living will to the office to be added to your chart at your convenience.  Next Medicare Annual Wellness Visit scheduled for next year: Yes 08/29/24 @ 9:00

## 2023-08-24 NOTE — Progress Notes (Signed)
Subjective:   Kristen Powell is a 77 y.o. female who presents for Medicare Annual (Subsequent) preventive examination.  Visit Complete: Virtual  I connected with  Kristen Powell on 08/24/23 by a audio enabled telemedicine application and verified that I am speaking with the correct person using two identifiers.  Patient Location: Home  Provider Location: Office/Clinic  I discussed the limitations of evaluation and management by telemedicine. The patient expressed understanding and agreed to proceed.  Patient Medicare AWV questionnaire was completed by the patient on 08/24/23; I have confirmed that all information answered by patient is correct and no changes since this date.  Vital Signs: Unable to obtain new vitals due to this being a telehealth visit. Patient provided some vital signs which has been documented.  Review of Systems     Cardiac Risk Factors include: advanced age (>11men, >40 women);dyslipidemia;hypertension     Objective:    Today's Vitals   08/24/23 1338 08/24/23 1359  BP: (!) 137/54 (!) 146/55  Pulse: 65   SpO2: 94%   Weight: 167 lb (75.8 kg)   Height: 4\' 11"  (1.499 m)    Body mass index is 33.73 kg/m.     08/24/2023    1:55 PM 08/21/2022   10:37 AM 12/09/2021    9:18 AM 04/15/2015    1:44 PM  Advanced Directives  Does Patient Have a Medical Advance Directive? Yes Yes No No  Type of Estate agent of [place];Living will Healthcare Power of [place];Living will    Does patient want to make changes to medical advance directive?  No - Patient declined    Copy of Healthcare Power of Attorney in Chart? No - copy requested No - copy requested    Would patient like information on creating a medical advance directive?    Yes - Educational materials given    Current Medications (verified) Outpatient Encounter Medications as of 08/24/2023  Medication Sig   acetaminophen (TYLENOL) 500 MG tablet Take 1-2 tablets (500-1,000 mg total)  by mouth every 4 (four) hours as needed for fever.   amLODipine (NORVASC) 10 MG tablet Take 1 tablet (10 mg total) by mouth daily.   aspirin 81 MG tablet Take 81 mg by mouth daily.   calcium carbonate (OS-CAL) 600 MG TABS tablet Take 600 mg by mouth daily as needed.   carvedilol (COREG) 12.5 MG tablet Take 1 tablet (12.5 mg total) by mouth 2 (two) times daily with a meal.   Cholecalciferol (VITAMIN D) 2000 UNITS CAPS Take by mouth daily. Patient is taking 3000 units instead of 2000   citalopram (CELEXA) 10 MG tablet Take 1 tablet (10 mg total) by mouth daily.   fenofibrate (TRICOR) 145 MG tablet Take 1 tablet (145 mg total) by mouth daily.   gabapentin (NEURONTIN) 100 MG capsule TAKE 1 CAPSULE BY MOUTH AT BEDTIME.   LORazepam (ATIVAN) 0.5 MG tablet Take 0.5-1 tablets (0.25-0.5 mg total) by mouth daily as needed for anxiety.   losartan (COZAAR) 100 MG tablet TAKE 1 TABLET (100 MG TOTAL) BY MOUTH DAILY. D/C 50 MG   Omega-3 Fatty Acids (FISH OIL) 1200 MG CAPS Take 1 capsule by mouth 2 (two) times daily.   omeprazole (PRILOSEC) 20 MG capsule TAKE 1 CAPSULE BY MOUTH TWICE A DAY   vitamin B-12 (CYANOCOBALAMIN) 1000 MCG tablet Take 1,000 mcg by mouth daily.   atorvastatin (LIPITOR) 40 MG tablet Take 1 tablet (40 mg total) by mouth daily.   [DISCONTINUED] predniSONE (DELTASONE) 20 MG tablet  Take 1 tablet (20 mg total) by mouth daily with breakfast. (Patient not taking: Reported on 08/24/2023)   [DISCONTINUED] triamcinolone (KENALOG) 0.025 % cream Apply 1 Application topically 3 (three) times daily as needed. (Patient not taking: Reported on 08/24/2023)   [DISCONTINUED] triamcinolone ointment (KENALOG) 0.5 % Apply 1 Application topically 2 (two) times daily. (Patient not taking: Reported on 08/24/2023)   No facility-administered encounter medications on file as of 08/24/2023.    Allergies (verified) Hydrochlorothiazide and Adhesive [tape]   History: Past Medical History:  Diagnosis Date   Anxiety     COVID-19    Diastolic dysfunction    a. 12/2021 Echo: EF 60-65%, no rwma, GrI DD, nl RV fxn, RVSP 36.84mmHg, mild MR.   Hyperlipidemia    Hypertension    Mild Mitral regurgitation    a. 12/2021 Echo: Mild MR.   Psoriasis    younger age   Spinal stenosis    Traumatic rupture of left ear drum    Past Surgical History:  Procedure Laterality Date   ABDOMINAL HYSTERECTOMY  12/14/1978   Partial   BASAL CELL CARCINOMA EXCISION  2006,2008,2011   BUNIONECTOMY     BUNIONECTOMY Bilateral 12/14/1990   COLONOSCOPY WITH PROPOFOL N/A 12/09/2021   Procedure: COLONOSCOPY WITH PROPOFOL;  Surgeon: Midge Minium, MD;  [facility] ENDOSCOPY;  Service: Endoscopy;  Laterality: N/A;   KNEE ARTHROSCOPY     KNEE CLOSED REDUCTION  05/07/2015   Procedure: CLOSED MANIPULATION KNEE;  Surgeon: Kennedy Bucker, MD;  Location: [facility] ORS;  Service: Orthopedics;;   KNEE SURGERY Bilateral 2005,2006   Repair   MOHS SURGERY     nose bcc, left cheeck Dr. Meredith Mody f/u Q 12/2020   REPLACEMENT TOTAL KNEE Left 04/11/2015   manipulation--05/07/2015   ROTATOR CUFF REPAIR W/ DISTAL CLAVICLE EXCISION     SHOULDER SURGERY Right    x2   SKIN CANCER EXCISION     TOTAL KNEE ARTHROPLASTY     VAGINAL HYSTERECTOMY     Family History  Problem Relation Age of Onset   Cancer Mother        lung   Hypertension Sister    Heart attack Sister 39   Breast cancer Paternal Aunt    Social History   Socioeconomic History   Marital status: Widowed    Spouse name: Not on file   Number of children: Not on file   Years of education: Not on file   Highest education level: Not on file  Occupational History   Not on file  Tobacco Use   Smoking status: Never   Smokeless tobacco: Never  Vaping Use   Vaping status: Never Used  Substance and Sexual Activity   Alcohol use: No   Drug use: No   Sexual activity: Never  Other Topics Concern   Not on file  Social History Narrative   Widowed x 12 years as of 06/2021    3 sisters and 1 brother     Has children son age 77, daughter 58, son 22 as of 06/2021   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      1 great grandchildren         Social Determinants of Health   Financial Resource Strain: Low Risk  (08/24/2023)   Overall Financial Resource Strain (CARDIA)    Difficulty of Paying Living Expenses: Not hard at all  Food Insecurity: No Food Insecurity (08/24/2023)   Hunger Vital Sign    Worried About Running Out of Food in the Last  Year: Never true    Ran Out of Food in the Last Year: Never true  Transportation Needs: No Transportation Needs (08/24/2023)   PRAPARE - Administrator, Civil Service (Medical): No    Lack of Transportation (Non-Medical): No  Physical Activity: Insufficiently Active (08/24/2023)   Exercise Vital Sign    Days of Exercise per Week: 2 days    Minutes of Exercise per Session: 20 min  Stress: No Stress Concern Present (08/24/2023)   [employer] of Occupational Health - Occupational Stress Questionnaire    Feeling of Stress : Not at all  Social Connections: Moderately Integrated (08/24/2023)   Social Connection and Isolation Panel [NHANES]    Frequency of Communication with Friends and Family: More than three times a week    Frequency of Social Gatherings with Friends and Family: Three times a week    Attends Religious Services: More than 4 times per year    Active Member of Clubs or Organizations: Yes    Attends Banker Meetings: More than 4 times per year    Marital Status: Widowed    Tobacco Counseling Counseling given: Not Answered   Clinical Intake:  Pre-visit preparation completed: Yes  Pain : No/denies pain     BMI - recorded: 33.73 Nutritional Status: BMI > 30  Obese Nutritional Risks: None Diabetes: No  How often do you need to have someone help you when you read instructions, pamphlets, or other written materials from your doctor or pharmacy?: 1 - Never  Interpreter Needed?: No  Information entered by  :: R. Demetrus Pavao LPN   Activities of Daily Living    08/24/2023    1:15 PM  In your present state of health, do you have any difficulty performing the following activities:  Hearing? 0  Vision? 0  Difficulty concentrating or making decisions? 0  Walking or climbing stairs? 1  Dressing or bathing? 0  Doing errands, shopping? 0  Preparing Food and eating ? N  Using the Toilet? N  In the past six months, have you accidently leaked urine? N  Do you have problems with loss of bowel control? N  Managing your Medications? N  Managing your Finances? N  Housekeeping or managing your Housekeeping? N    Patient Care Team: Allegra Grana, FNP as PCP - General (Family Medicine) Debbe Odea, MD as PCP - Cardiology (Cardiology)  Indicate any recent Medical Services you may have received from other than [facility] providers in the past year (date may be approximate).     Assessment:   This is a routine wellness examination for Bethany.  Hearing/Vision screen Hearing Screening - Comments:: No issues Vision Screening - Comments:: readers   Goals Addressed             This Visit's Progress    Patient Stated       Wants to lose weight       Depression Screen    08/24/2023    1:51 PM 07/07/2023   11:00 AM 03/22/2023    8:37 AM 03/03/2023    9:56 AM 09/18/2022    8:37 AM 08/21/2022   10:53 AM 11/05/2021    8:59 AM  PHQ 2/9 Scores  PHQ - 2 Score 0 0 0 0 0 0 0  PHQ- 9 Score 0          Fall Risk    08/24/2023    1:15 PM 07/07/2023   11:00 AM 03/22/2023    8:37 AM 03/03/2023  9:56 AM 09/18/2022    8:37 AM  Fall Risk   Falls in the past year? 1 1 0 0 0  Number falls in past yr: 0 0 0 0 0  Injury with Fall? 1 0 0 0 0  Comment went to Urgent Care      Risk for fall due to : History of fall(s);Impaired balance/gait History of fall(s) No Fall Risks No Fall Risks No Fall Risks  Follow up Falls evaluation completed;Falls prevention discussed Falls evaluation completed Falls evaluation  completed Falls evaluation completed Falls evaluation completed    MEDICARE RISK AT HOME: Medicare Risk at Home Any stairs in or around the home?: Yes If so, are there any without handrails?: No Home free of loose throw rugs in walkways, pet beds, electrical cords, etc?: Yes Adequate lighting in your home to reduce risk of falls?: Yes Life alert?: No Use of a cane, walker or w/c?: No Grab bars in the bathroom?: No Shower chair or bench in shower?: Yes Elevated toilet seat or a handicapped toilet?: No   Cognitive Function:        08/24/2023    1:55 PM 08/21/2022   10:59 AM  6CIT Screen  What Year? 0 points 0 points  What month? 0 points 0 points  What time? 0 points 0 points  Count back from 20 0 points 0 points  Months in reverse 0 points 0 points  Repeat phrase 0 points 0 points  Total Score 0 points 0 points    Immunizations Immunization History  Administered Date(s) Administered   Fluad Quad(high Dose 65+) 10/02/2019, 09/05/2020, 08/29/2021, 09/18/2022   Influenza Split 09/13/2013   Influenza, High Dose Seasonal PF 10/10/2018   Influenza, Seasonal, Injecte, Preservative Fre 09/09/2016   Influenza,inj,Quad PF,6+ Mos 08/13/2014, 09/15/2015   PFIZER(Purple Top)SARS-COV-2 Vaccination 01/31/2020, 02/21/2020, 03/14/2020, 11/26/2020   Pneumococcal Conjugate-13 07/11/2014   Pneumococcal Polysaccharide-23 07/25/2015   Td 01/05/2014   Zoster Recombinant(Shingrix) 06/22/2018, 08/23/2018   Zoster, Live 07/11/2014    TDAP status: Up to date  Flu Vaccine status: Due, Education has been provided regarding the importance of this vaccine. Advised may receive this vaccine at local pharmacy or Health Dept. Aware to provide a copy of the vaccination record if obtained from local pharmacy or Health Dept. Verbalized acceptance and understanding.  Pneumococcal vaccine status: Up to date  Covid-19 vaccine status: Declined, Education has been provided regarding the importance of this  vaccine but patient still declined. Advised may receive this vaccine at local pharmacy or Health Dept.or vaccine clinic. Aware to provide a copy of the vaccination record if obtained from local pharmacy or Health Dept. Verbalized acceptance and understanding.  Qualifies for Shingles Vaccine? Yes   Zostavax completed Yes   Shingrix Completed?: Yes  Screening Tests Health Maintenance  Topic Date Due   INFLUENZA VACCINE  07/15/2023   COVID-19 Vaccine (5 - 2023-24 season) 08/15/2023   Medicare Annual Wellness (AWV)  08/22/2023   DTaP/Tdap/Td (2 - Tdap) 01/06/2024   Pneumonia Vaccine 39+ Years old  Completed   DEXA SCAN  Completed   Hepatitis C Screening  Completed   Zoster Vaccines- Shingrix  Completed   HPV VACCINES  Aged Out   Colonoscopy  Discontinued    Health Maintenance  Health Maintenance Due  Topic Date Due   INFLUENZA VACCINE  07/15/2023   COVID-19 Vaccine (5 - 2023-24 season) 08/15/2023   Medicare Annual Wellness (AWV)  08/22/2023    Colorectal cancer screening: No longer required.   Mammogram  status: Completed 1824. Repeat every year  Bone Density status: Completed 3/24. Results reflect: Bone density results: OSTEOPENIA. Repeat every 2 years.  Lung Cancer Screening: (Low Dose CT Chest recommended if Age 31-80 years, 20 pack-year currently smoking OR have quit w/in 15years.) does not qualify.    Additional Screening:  Hepatitis C Screening: does qualify; Completed 8/17  Vision Screening: Recommended annual ophthalmology exams for early detection of glaucoma and other disorders of the eye. Is the patient up to date with their annual eye exam?  No  Who is the provider or what is the name of the office in which the patient attends annual eye exams? Dr. Dava Najjar making an appointment If pt is not established with a provider, would they like to be referred to a provider to establish care? No .   Dental Screening: Recommended annual dental exams for proper oral  hygiene   Community Resource Referral / Chronic Care Management: CRR required this visit?  No   CCM required this visit?  No     Plan:     I have personally reviewed and noted the following in the patient's chart:   Medical and social history Use of alcohol, tobacco or illicit drugs  Current medications and supplements including opioid prescriptions. Patient is not currently taking opioid prescriptions. Functional ability and status Nutritional status Physical activity Advanced directives List of other physicians Hospitalizations, surgeries, and ER visits in previous 12 months Vitals Screenings to include cognitive, depression, and falls Referrals and appointments  In addition, I have reviewed and discussed with patient certain preventive protocols, quality metrics, and best practice recommendations. A written personalized care plan for preventive services as well as general preventive health recommendations were provided to patient.     Sydell Axon, LPN   7/82/9562   After Visit Summary: (MyChart) Due to this being a telephonic visit, the after visit summary with patients personalized plan was offered to patient via MyChart   Nurse Notes: None

## 2023-08-26 ENCOUNTER — Telehealth: Payer: Self-pay

## 2023-08-26 DIAGNOSIS — M19071 Primary osteoarthritis, right ankle and foot: Secondary | ICD-10-CM

## 2023-08-26 DIAGNOSIS — G629 Polyneuropathy, unspecified: Secondary | ICD-10-CM

## 2023-08-26 MED ORDER — GABAPENTIN 100 MG PO CAPS
100.0000 mg | ORAL_CAPSULE | Freq: Every day | ORAL | 3 refills | Status: DC
Start: 2023-08-26 — End: 2024-08-28

## 2023-08-26 NOTE — Telephone Encounter (Signed)
Prescription Request  08/26/2023  LOV: Visit date not found  What is the name of the medication or equipment? gabapentin (NEURONTIN) 100 MG capsule  Have you contacted your pharmacy to request a refill? Yes   Which pharmacy would you like this sent to?  [id number] [address] [place] [address] Phone: [phone] Fax: [phone]    Patient notified that their request is being sent to the clinical staff for review and that they should receive a response within 2 business days.   Please advise at Mobile [phone] (mobile)  Patient states she is out of this medication.

## 2023-08-26 NOTE — Telephone Encounter (Signed)
Spoke to pt is aware rx has been sent in to pharmacy

## 2023-09-21 ENCOUNTER — Encounter: Payer: Self-pay | Admitting: Family

## 2023-09-21 ENCOUNTER — Ambulatory Visit: Payer: Medicare Other | Admitting: Family

## 2023-09-21 VITALS — BP 136/84 | HR 65 | Temp 97.5°F | Ht 59.0 in | Wt 166.4 lb

## 2023-09-21 DIAGNOSIS — Z1231 Encounter for screening mammogram for malignant neoplasm of breast: Secondary | ICD-10-CM

## 2023-09-21 DIAGNOSIS — I70211 Atherosclerosis of native arteries of extremities with intermittent claudication, right leg: Secondary | ICD-10-CM | POA: Diagnosis not present

## 2023-09-21 DIAGNOSIS — E782 Mixed hyperlipidemia: Secondary | ICD-10-CM

## 2023-09-21 DIAGNOSIS — E538 Deficiency of other specified B group vitamins: Secondary | ICD-10-CM | POA: Diagnosis not present

## 2023-09-21 DIAGNOSIS — I10 Essential (primary) hypertension: Secondary | ICD-10-CM

## 2023-09-21 DIAGNOSIS — F411 Generalized anxiety disorder: Secondary | ICD-10-CM

## 2023-09-21 DIAGNOSIS — Z23 Encounter for immunization: Secondary | ICD-10-CM | POA: Diagnosis not present

## 2023-09-21 DIAGNOSIS — K219 Gastro-esophageal reflux disease without esophagitis: Secondary | ICD-10-CM

## 2023-09-21 DIAGNOSIS — R7309 Other abnormal glucose: Secondary | ICD-10-CM | POA: Diagnosis not present

## 2023-09-21 DIAGNOSIS — Z87898 Personal history of other specified conditions: Secondary | ICD-10-CM | POA: Diagnosis not present

## 2023-09-21 LAB — CBC WITH DIFFERENTIAL/PLATELET
Basophils Absolute: 0 10*3/uL (ref 0.0–0.1)
Basophils Relative: 0.4 % (ref 0.0–3.0)
Eosinophils Absolute: 0.1 10*3/uL (ref 0.0–0.7)
Eosinophils Relative: 2.1 % (ref 0.0–5.0)
HCT: 34.3 % — ABNORMAL LOW (ref 36.0–46.0)
Hemoglobin: 11.4 g/dL — ABNORMAL LOW (ref 12.0–15.0)
Lymphocytes Relative: 23 % (ref 12.0–46.0)
Lymphs Abs: 1.2 10*3/uL (ref 0.7–4.0)
MCHC: 33.1 g/dL (ref 30.0–36.0)
MCV: 98.4 fL (ref 78.0–100.0)
Monocytes Absolute: 0.6 10*3/uL (ref 0.1–1.0)
Monocytes Relative: 10.3 % (ref 3.0–12.0)
Neutro Abs: 3.5 10*3/uL (ref 1.4–7.7)
Neutrophils Relative %: 64.2 % (ref 43.0–77.0)
Platelets: 274 10*3/uL (ref 150.0–400.0)
RBC: 3.49 Mil/uL — ABNORMAL LOW (ref 3.87–5.11)
RDW: 13.3 % (ref 11.5–15.5)
WBC: 5.4 10*3/uL (ref 4.0–10.5)

## 2023-09-21 LAB — LIPID PANEL
Cholesterol: 142 mg/dL (ref 0–200)
HDL: 53.2 mg/dL (ref >39.00)
LDL Cholesterol: 69 mg/dL (ref 0–99)
NonHDL: 88.43
Total CHOL/HDL Ratio: 3
Triglycerides: 99 mg/dL (ref 0.0–149.0)
VLDL: 19.8 mg/dL (ref 0.0–40.0)

## 2023-09-21 LAB — B12 AND FOLATE PANEL
Folate: 10.6 ng/mL (ref >5.9)
Vitamin B-12: 662 pg/mL (ref 211–911)

## 2023-09-21 LAB — HEMOGLOBIN A1C: Hgb A1c MFr Bld: 6 % (ref 4.6–6.5)

## 2023-09-21 MED ORDER — OMEPRAZOLE 20 MG PO CPDR: 20.0000 mg | DELAYED_RELEASE_CAPSULE | Freq: Every day | ORAL | Status: DC

## 2023-09-21 NOTE — Progress Notes (Signed)
Assessment & Plan:  Encounter for screening mammogram for malignant neoplasm of breast -     3D Screening Mammogram, Left and Right; Future  B12 deficiency due to diet -     CBC with Differential/Platelet -     Iron, TIBC and Ferritin Panel  Essential hypertension -     B12 and Folate Panel  Gastroesophageal reflux disease without esophagitis Assessment & Plan: Overall symptoms are quiet at this time.  Discussed long-term side effects with PPIs.  Advised to decrease omeprazole 20 mg daily and if symptoms remain quiet to remain off of medication and to use for flares.  Orders: -     Omeprazole; Take 1 capsule (20 mg total) by mouth daily.  Mixed hyperlipidemia -     Lipid panel  History of prediabetes -     Hemoglobin A1c  Other abnormal glucose -     Hemoglobin A1c  Need for influenza vaccination -     Flu Vaccine Trivalent High Dose (Fluad)  Atherosclerosis of native artery of right lower extremity with intermittent claudication [facility]) Assessment & Plan: She has follow-up scheduled for repeat ABI.  She is no longer on simvastatin.  Continue atorvastatin 40 mg daily, aspirin 81 mg daily.  Continue following with cardiology, vascular.  Will follow   Generalized anxiety disorder Assessment & Plan: Chronic, stable.  Continue Celexa 10 mg daily, lorazepam 0.5 mg daily prn      Return precautions given.   Risks, benefits, and alternatives of the medications and treatment plan prescribed today were discussed, and patient expressed understanding.   Education regarding symptom management and diagnosis given to patient on AVS either electronically or printed.  No follow-ups on file.  Rennie Plowman, FNP  Subjective:    Patient ID: Kristen Powell, female    DOB: [birth year], 77 y.o.   MRN: [id number]  CC: Kristen Powell is a 76 y.o. female who presents today for follow up.   HPI: Feels well today.  No new complaints   Compliant with celexa 10mg , lorazepam  0.5mg  every day prn.   She is no longer on simvastatin.  She is compliant with atorvastatin 40 mg daily as prescribed by Dr. Sandie Ano  She takes omeprazole 20 mg twice daily.  Denies history of Barrett's esophagus.  Remote history of peptic ulcer disease decades ago.  No epigastric burning, hoarseness or trouble swallowing.  Allergies: Hydrochlorothiazide and Adhesive [tape] Current Outpatient Medications on File Prior to Visit  Medication Sig Dispense Refill   acetaminophen (TYLENOL) 500 MG tablet Take 1-2 tablets (500-1,000 mg total) by mouth every 4 (four) hours as needed for fever. 30 tablet 0   amLODipine (NORVASC) 10 MG tablet Take 1 tablet (10 mg total) by mouth daily. 90 tablet 3   aspirin 81 MG tablet Take 81 mg by mouth daily.     calcium carbonate (OS-CAL) 600 MG TABS tablet Take 600 mg by mouth daily as needed.     carvedilol (COREG) 12.5 MG tablet Take 1 tablet (12.5 mg total) by mouth 2 (two) times daily with a meal. 180 tablet 1   Cholecalciferol (VITAMIN D) 2000 UNITS CAPS Take by mouth daily. Patient is taking 3000 units instead of 2000     citalopram (CELEXA) 10 MG tablet Take 1 tablet (10 mg total) by mouth daily. 90 tablet 3   fenofibrate (TRICOR) 145 MG tablet TAKE 1 TABLET BY MOUTH EVERY DAY 90 tablet 3   gabapentin (NEURONTIN) 100 MG capsule Take 1 capsule (  100 mg total) by mouth at bedtime. 90 capsule 3   LORazepam (ATIVAN) 0.5 MG tablet Take 0.5-1 tablets (0.25-0.5 mg total) by mouth daily as needed for anxiety. 30 tablet 2   losartan (COZAAR) 100 MG tablet TAKE 1 TABLET (100 MG TOTAL) BY MOUTH DAILY. D/C 50 MG 90 tablet 3   Omega-3 Fatty Acids (FISH OIL) 1200 MG CAPS Take 1 capsule by mouth 2 (two) times daily.     vitamin B-12 (CYANOCOBALAMIN) 1000 MCG tablet Take 1,000 mcg by mouth daily.     atorvastatin (LIPITOR) 40 MG tablet Take 1 tablet (40 mg total) by mouth daily. 90 tablet 3   No current facility-administered medications on file prior to visit.    Review of  Systems  Constitutional:  Negative for chills and fever.  Respiratory:  Negative for cough.   Cardiovascular:  Negative for chest pain and palpitations.  Gastrointestinal:  Negative for nausea and vomiting.      Objective:    BP 136/84   Pulse 65   Temp (!) 97.5 F (36.4 C) (Oral)   Ht 4\' 11"  (1.499 m)   Wt 166 lb 6.4 oz (75.5 kg)   SpO2 99%   BMI 33.61 kg/m  BP Readings from Last 3 Encounters:  09/21/23 136/84  08/24/23 (!) 146/55  07/07/23 130/68   Wt Readings from Last 3 Encounters:  09/21/23 166 lb 6.4 oz (75.5 kg)  08/24/23 167 lb (75.8 kg)  07/07/23 167 lb 3.2 oz (75.8 kg)    Physical Exam Vitals reviewed.  Constitutional:      Appearance: She is well-developed.  Eyes:     Conjunctiva/sclera: Conjunctivae normal.  Cardiovascular:     Rate and Rhythm: Normal rate and regular rhythm.     Pulses: Normal pulses.     Heart sounds: Normal heart sounds.  Pulmonary:     Effort: Pulmonary effort is normal.     Breath sounds: Normal breath sounds. No wheezing, rhonchi or rales.  Skin:    General: Skin is warm and dry.  Neurological:     Mental Status: She is alert.  Psychiatric:        Speech: Speech normal.        Behavior: Behavior normal.        Thought Content: Thought content normal.

## 2023-09-21 NOTE — Assessment & Plan Note (Signed)
She has follow-up scheduled for repeat ABI.  She is no longer on simvastatin.  Continue atorvastatin 40 mg daily, aspirin 81 mg daily.  Continue following with cardiology, vascular.  Will follow

## 2023-09-21 NOTE — Assessment & Plan Note (Signed)
Chronic, stable.  Continue Celexa 10 mg daily, lorazepam 0.5 mg daily prn

## 2023-09-21 NOTE — Assessment & Plan Note (Signed)
Overall symptoms are quiet at this time.  Discussed long-term side effects with PPIs.  Advised to decrease omeprazole 20 mg daily and if symptoms remain quiet to remain off of medication and to use for flares.

## 2023-09-22 ENCOUNTER — Encounter: Payer: Self-pay | Admitting: Family

## 2023-09-22 LAB — IRON,TIBC AND FERRITIN PANEL
%SAT: 20 % (ref 16–45)
Ferritin: 45 ng/mL (ref 16–288)
Iron: 81 ug/dL (ref 45–160)
TIBC: 396 ug/dL (ref 250–450)

## 2023-09-23 ENCOUNTER — Other Ambulatory Visit: Payer: Self-pay

## 2023-09-23 DIAGNOSIS — R300 Dysuria: Secondary | ICD-10-CM

## 2023-09-23 DIAGNOSIS — D649 Anemia, unspecified: Secondary | ICD-10-CM

## 2023-09-28 ENCOUNTER — Ambulatory Visit (INDEPENDENT_AMBULATORY_CARE_PROVIDER_SITE_OTHER): Payer: Medicare Other | Admitting: Vascular Surgery

## 2023-09-28 ENCOUNTER — Ambulatory Visit (INDEPENDENT_AMBULATORY_CARE_PROVIDER_SITE_OTHER): Payer: Medicare Other

## 2023-09-28 ENCOUNTER — Encounter (INDEPENDENT_AMBULATORY_CARE_PROVIDER_SITE_OTHER): Payer: Self-pay | Admitting: Vascular Surgery

## 2023-09-28 VITALS — BP 131/59 | HR 64 | Resp 18 | Ht 60.0 in | Wt 166.6 lb

## 2023-09-28 DIAGNOSIS — E782 Mixed hyperlipidemia: Secondary | ICD-10-CM

## 2023-09-28 DIAGNOSIS — I10 Essential (primary) hypertension: Secondary | ICD-10-CM

## 2023-09-28 DIAGNOSIS — I70211 Atherosclerosis of native arteries of extremities with intermittent claudication, right leg: Secondary | ICD-10-CM | POA: Diagnosis not present

## 2023-09-28 NOTE — Progress Notes (Unsigned)
MRN : [id number]  Kristen Powell is a 77 y.o. (25-May-1946) female who presents with chief complaint of No chief complaint on file. Marland Kitchen  History of Present Illness: Patient returns today in follow up of ***  Current Outpatient Medications  Medication Sig Dispense Refill   acetaminophen (TYLENOL) 500 MG tablet Take 1-2 tablets (500-1,000 mg total) by mouth every 4 (four) hours as needed for fever. 30 tablet 0   amLODipine (NORVASC) 10 MG tablet Take 1 tablet (10 mg total) by mouth daily. 90 tablet 3   aspirin 81 MG tablet Take 81 mg by mouth daily.     atorvastatin (LIPITOR) 40 MG tablet Take 1 tablet (40 mg total) by mouth daily. 90 tablet 3   calcium carbonate (OS-CAL) 600 MG TABS tablet Take 600 mg by mouth daily as needed.     carvedilol (COREG) 12.5 MG tablet Take 1 tablet (12.5 mg total) by mouth 2 (two) times daily with a meal. 180 tablet 1   Cholecalciferol (VITAMIN D) 2000 UNITS CAPS Take by mouth daily. Patient is taking 3000 units instead of 2000     citalopram (CELEXA) 10 MG tablet Take 1 tablet (10 mg total) by mouth daily. 90 tablet 3   fenofibrate (TRICOR) 145 MG tablet TAKE 1 TABLET BY MOUTH EVERY DAY 90 tablet 3   gabapentin (NEURONTIN) 100 MG capsule Take 1 capsule (100 mg total) by mouth at bedtime. 90 capsule 3   LORazepam (ATIVAN) 0.5 MG tablet Take 0.5-1 tablets (0.25-0.5 mg total) by mouth daily as needed for anxiety. 30 tablet 2   losartan (COZAAR) 100 MG tablet TAKE 1 TABLET (100 MG TOTAL) BY MOUTH DAILY. D/C 50 MG 90 tablet 3   Omega-3 Fatty Acids (FISH OIL) 1200 MG CAPS Take 1 capsule by mouth 2 (two) times daily.     omeprazole (PRILOSEC) 20 MG capsule Take 1 capsule (20 mg total) by mouth daily.     vitamin B-12 (CYANOCOBALAMIN) 1000 MCG tablet Take 1,000 mcg by mouth daily.     No current facility-administered medications for this visit.    Past Medical History:  Diagnosis Date   Anxiety    COVID-19    Diastolic dysfunction    a. 12/2021 Echo: EF  60-65%, no rwma, GrI DD, nl RV fxn, RVSP 36.24mmHg, mild MR.   Hyperlipidemia    Hypertension    Mild Mitral regurgitation    a. 12/2021 Echo: Mild MR.   Psoriasis    younger age   Spinal stenosis    Traumatic rupture of left ear drum     Past Surgical History:  Procedure Laterality Date   ABDOMINAL HYSTERECTOMY  12/14/1978   Partial   BASAL CELL CARCINOMA EXCISION  2006,2008,2011   BUNIONECTOMY     BUNIONECTOMY Bilateral 12/14/1990   COLONOSCOPY WITH PROPOFOL N/A 12/09/2021   Procedure: COLONOSCOPY WITH PROPOFOL;  Surgeon: Midge Minium, MD;  Location: [facility] ENDOSCOPY;  Service: Endoscopy;  Laterality: N/A;   KNEE ARTHROSCOPY     KNEE CLOSED REDUCTION  05/07/2015   Procedure: CLOSED MANIPULATION KNEE;  Surgeon: Kennedy Bucker, MD;  Location: [facility] ORS;  Service: Orthopedics;;   KNEE SURGERY Bilateral 2005,2006   Repair   MOHS SURGERY     nose bcc, left cheeck Dr. Meredith Mody f/u Q 12/2020   REPLACEMENT TOTAL KNEE Left 04/11/2015   manipulation--05/07/2015   ROTATOR CUFF REPAIR W/ DISTAL CLAVICLE EXCISION     SHOULDER SURGERY Right    x2   SKIN CANCER EXCISION  TOTAL KNEE ARTHROPLASTY     VAGINAL HYSTERECTOMY       Social History   Tobacco Use   Smoking status: Never   Smokeless tobacco: Never  Vaping Use   Vaping status: Never Used  Substance Use Topics   Alcohol use: No   Drug use: No      Family History  Problem Relation Age of Onset   Cancer Mother        lung   Hypertension Sister    Heart attack Sister 58   Breast cancer Paternal Aunt      Allergies  Allergen Reactions   Hydrochlorothiazide Other (See Comments)    Hyponatremia    Adhesive [Tape] Rash    redness     REVIEW OF SYSTEMS (Negative unless checked)   Constitutional: [] Weight loss  [] Fever  [] Chills Cardiac: [] Chest pain   [] Chest pressure   [] Palpitations   [] Shortness of breath when laying flat   [] Shortness of breath at rest   [] Shortness of breath with exertion. Vascular:  [x] Pain in  legs with walking   [] Pain in legs at rest   [] Pain in legs when laying flat   [x] Claudication   [] Pain in feet when walking  [] Pain in feet at rest  [] Pain in feet when laying flat   [] History of DVT   [] Phlebitis   [] Swelling in legs   [] Varicose veins   [] Non-healing ulcers Pulmonary:   [] Uses home oxygen   [] Productive cough   [] Hemoptysis   [] Wheeze  [] COPD   [] Asthma Neurologic:  [] Dizziness  [] Blackouts   [] Seizures   [] History of stroke   [] History of TIA  [] Aphasia   [] Temporary blindness   [] Dysphagia   [] Weakness or numbness in arms   [] Weakness or numbness in legs Musculoskeletal:  [x] Arthritis   [] Joint swelling   [x] Joint pain   [] Low back pain Hematologic:  [] Easy bruising  [] Easy bleeding   [] Hypercoagulable state   [] Anemic   Gastrointestinal:  [] Blood in stool   [] Vomiting blood  [] Gastroesophageal reflux/heartburn   [] Abdominal pain Genitourinary:  [] Chronic kidney disease   [] Difficult urination  [] Frequent urination  [] Burning with urination   [] Hematuria Skin:  [] Rashes   [] Ulcers   [] Wounds Psychological:  [] History of anxiety   []  History of major depression.  Physical Examination  There were no vitals taken for this visit. Gen:  WD/WN, NAD Head: [place]/AT, No temporalis wasting. Ear/Nose/Throat: Hearing grossly intact, nares w/o erythema or drainage Eyes: Conjunctiva clear. Sclera non-icteric Neck: Supple.  Trachea midline Pulmonary:  Good air movement, no use of accessory muscles.  Cardiac: RRR, no JVD Vascular:  Vessel Right Left  Radial Palpable Palpable                          PT Palpable Palpable  DP Palpable Palpable   Gastrointestinal: soft, non-tender/non-distended. No guarding/reflex.  Musculoskeletal: M/S 5/5 throughout.  No deformity or atrophy. *** edema. Neurologic: Sensation grossly intact in extremities.  Symmetrical.  Speech is fluent.  Psychiatric: Judgment intact, Mood & affect appropriate for pt's clinical situation. Dermatologic: No rashes  or ulcers noted.  No cellulitis or open wounds.      Labs Recent Results (from the past 2160 hour(s))  CBC with Differential/Platelet     Status: Abnormal   Collection Time: 09/21/23  9:39 AM  Result Value Ref Range   WBC 5.4 4.0 - 10.5 K/uL   RBC 3.49 (L) 3.87 - 5.11 Mil/uL  Hemoglobin 11.4 (L) 12.0 - 15.0 g/dL   HCT 16.1 (L) 09.6 - 04.5 %   MCV 98.4 78.0 - 100.0 fl   MCHC 33.1 30.0 - 36.0 g/dL   RDW 40.9 81.1 - 91.4 %   Platelets 274.0 150.0 - 400.0 K/uL   Neutrophils Relative % 64.2 43.0 - 77.0 %   Lymphocytes Relative 23.0 12.0 - 46.0 %   Monocytes Relative 10.3 3.0 - 12.0 %   Eosinophils Relative 2.1 0.0 - 5.0 %   Basophils Relative 0.4 0.0 - 3.0 %   Neutro Abs 3.5 1.4 - 7.7 K/uL   Lymphs Abs 1.2 0.7 - 4.0 K/uL   Monocytes Absolute 0.6 0.1 - 1.0 K/uL   Eosinophils Absolute 0.1 0.0 - 0.7 K/uL   Basophils Absolute 0.0 0.0 - 0.1 K/uL  Hemoglobin A1c     Status: None   Collection Time: 09/21/23  9:39 AM  Result Value Ref Range   Hgb A1c MFr Bld 6.0 4.6 - 6.5 %    Comment: Glycemic Control Guidelines for People with Diabetes:Non Diabetic:  <6%Goal of Therapy: <7%Additional Action Suggested:  >8%   B12 and Folate Panel     Status: None   Collection Time: 09/21/23  9:39 AM  Result Value Ref Range   Vitamin B-12 662 211 - 911 pg/mL   Folate 10.6 >5.9 ng/mL  Iron, TIBC and Ferritin Panel     Status: None   Collection Time: 09/21/23  9:39 AM  Result Value Ref Range   Iron 81 45 - 160 mcg/dL   TIBC 782 956 - 213 mcg/dL (calc)   %SAT 20 16 - 45 % (calc)   Ferritin 45 16 - 288 ng/mL  Lipid Profile     Status: None   Collection Time: 09/21/23  9:39 AM  Result Value Ref Range   Cholesterol 142 0 - 200 mg/dL    Comment: ATP III Classification       Desirable:  < 200 mg/dL               Borderline High:  200 - 239 mg/dL          High:  > = 086 mg/dL   Triglycerides 57.8 0.0 - 149.0 mg/dL    Comment: Normal:  <469 mg/dLBorderline High:  150 - 199 mg/dL   HDL 62.95 >28.41  mg/dL   VLDL 32.4 0.0 - 40.1 mg/dL   LDL Cholesterol 69 0 - 99 mg/dL   Total CHOL/HDL Ratio 3     Comment:                Men          Women1/2 Average Risk     3.4          3.3Average Risk          5.0          4.42X Average Risk          9.6          7.13X Average Risk          15.0          11.0                       NonHDL 88.43     Comment: NOTE:  Non-HDL goal should be 30 mg/dL higher than patient's LDL goal (i.e. LDL goal of < 70 mg/dL, would have non-HDL goal of < 100 mg/dL)  Radiology No results found.  Assessment/Plan Essential hypertension blood pressure control important in reducing the progression of atherosclerotic disease. On appropriate oral medications.     HLD (hyperlipidemia) lipid control important in reducing the progression of atherosclerotic disease. On Fish oil and Tricor  No problem-specific Assessment & Plan notes found for this encounter.    Festus Barren, MD  09/28/2023 8:27 AM    This note was created with Dragon medical transcription system.  Any errors from dictation are purely unintentional

## 2023-09-29 LAB — VAS US ABI WITH/WO TBI
Left ABI: 1.11
Right ABI: 1.06

## 2023-09-29 NOTE — Assessment & Plan Note (Signed)
ABIs today are actually a little better at 1.06 on the right and 1.11 on the left with biphasic waveforms.  Digital pressures are slightly reduced on the right.  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 We had a long discussion today and she is still bothered by claudication symptoms and is considering intervention because her pastor had such a good result from his.  I discussed that that is certainly a reasonable option and appropriate if she desires that.  She is debating but at current does not want to have anything done and so I will set up a 23-month follow-up visit with ABIs.  If she decides to have intervention, she can contact our office to set that up.

## 2023-10-26 ENCOUNTER — Telehealth (INDEPENDENT_AMBULATORY_CARE_PROVIDER_SITE_OTHER): Payer: Self-pay

## 2023-10-26 NOTE — Telephone Encounter (Signed)
She needs to come in to talk to him about the procedure

## 2023-10-26 NOTE — Telephone Encounter (Signed)
Patient called stating  Dr. Wyn Quaker told her to let him know when she's ready to move forward with her surgery now.  She would like to have a consult to talk about the procedure.  Please advise

## 2023-11-16 ENCOUNTER — Encounter (INDEPENDENT_AMBULATORY_CARE_PROVIDER_SITE_OTHER): Payer: Self-pay | Admitting: Vascular Surgery

## 2023-11-16 ENCOUNTER — Ambulatory Visit (INDEPENDENT_AMBULATORY_CARE_PROVIDER_SITE_OTHER): Payer: Medicare Other | Admitting: Vascular Surgery

## 2023-11-16 VITALS — BP 155/78 | HR 67 | Resp 18 | Ht 59.0 in | Wt 169.2 lb

## 2023-11-16 DIAGNOSIS — I70211 Atherosclerosis of native arteries of extremities with intermittent claudication, right leg: Secondary | ICD-10-CM | POA: Diagnosis not present

## 2023-11-16 DIAGNOSIS — I10 Essential (primary) hypertension: Secondary | ICD-10-CM

## 2023-11-16 DIAGNOSIS — E782 Mixed hyperlipidemia: Secondary | ICD-10-CM

## 2023-11-16 NOTE — H&P (View-Only) (Signed)
 MRN : [id number]  Kristen Powell is a 77 y.o. ([birth year]) female who presents with chief complaint of  Chief Complaint  Patient presents with   Follow-up    consult to talk about procedure  .  History of Present Illness: Patient returns today in follow up of peripheral arterial disease.  She has a litany of things that cause her lower extremity pain including severe arthritis, neurogenic pain from her back, as well as peripheral arterial disease.  She has some pain even at rest, but the more severely affecting issue currently is the pain with activity.  She has to stop and rest and she is still very active and working and this is quite bothersome to her.  She has previously had a noninvasive study showing her ABIs to be preserved, but having hemodynamically significant stenosis by duplex in the right SFA.  She was originally slated to come back in the spring, but due to worsening pain with activity she comes back earlier to discuss potential revascularization.  She denies any open wounds or infection.  This does not have typical symptoms of ischemic rest pain.  Current Outpatient Medications  Medication Sig Dispense Refill   acetaminophen (TYLENOL) 500 MG tablet Take 1-2 tablets (500-1,000 mg total) by mouth every 4 (four) hours as needed for fever. 30 tablet 0   amLODipine (NORVASC) 10 MG tablet Take 1 tablet (10 mg total) by mouth daily. 90 tablet 3   aspirin 81 MG tablet Take 81 mg by mouth daily.     atorvastatin (LIPITOR) 40 MG tablet Take 1 tablet (40 mg total) by mouth daily. 90 tablet 3   calcium carbonate (OS-CAL) 600 MG TABS tablet Take 600 mg by mouth daily as needed.     carvedilol (COREG) 12.5 MG tablet Take 1 tablet (12.5 mg total) by mouth 2 (two) times daily with a meal. 180 tablet 1   Cholecalciferol (VITAMIN D) 2000 UNITS CAPS Take by mouth daily. Patient is taking 3000 units instead of 2000     citalopram (CELEXA) 10 MG tablet Take 1 tablet (10 mg total) by mouth daily.  90 tablet 3   fenofibrate (TRICOR) 145 MG tablet TAKE 1 TABLET BY MOUTH EVERY DAY 90 tablet 3   gabapentin (NEURONTIN) 100 MG capsule Take 1 capsule (100 mg total) by mouth at bedtime. 90 capsule 3   LORazepam (ATIVAN) 0.5 MG tablet Take 0.5-1 tablets (0.25-0.5 mg total) by mouth daily as needed for anxiety. 30 tablet 2   losartan (COZAAR) 100 MG tablet TAKE 1 TABLET (100 MG TOTAL) BY MOUTH DAILY. D/C 50 MG 90 tablet 3   Omega-3 Fatty Acids (FISH OIL) 1200 MG CAPS Take 1 capsule by mouth 2 (two) times daily.     omeprazole (PRILOSEC) 20 MG capsule Take 1 capsule (20 mg total) by mouth daily.     vitamin B-12 (CYANOCOBALAMIN) 1000 MCG tablet Take 1,000 mcg by mouth daily.     No current facility-administered medications for this visit.    Past Medical History:  Diagnosis Date   Anxiety    COVID-19    Diastolic dysfunction    a. 12/2021 Echo: EF 60-65%, no rwma, GrI DD, nl RV fxn, RVSP 36.5mmHg, mild MR.   Hyperlipidemia    Hypertension    Mild Mitral regurgitation    a. 12/2021 Echo: Mild MR.   Psoriasis    younger age   Spinal stenosis    Traumatic rupture of left ear drum  Past Surgical History:  Procedure Laterality Date   ABDOMINAL HYSTERECTOMY  12/14/1978   Partial   BASAL CELL CARCINOMA EXCISION  2006,2008,2011   BUNIONECTOMY     BUNIONECTOMY Bilateral 12/14/1990   COLONOSCOPY WITH PROPOFOL N/A 12/09/2021   Procedure: COLONOSCOPY WITH PROPOFOL;  Surgeon: Midge Minium, MD;  Location: [facility] ENDOSCOPY;  Service: Endoscopy;  Laterality: N/A;   KNEE ARTHROSCOPY     KNEE CLOSED REDUCTION  05/07/2015   Procedure: CLOSED MANIPULATION KNEE;  Surgeon: Kennedy Bucker, MD;  Location: [facility] ORS;  Service: Orthopedics;;   KNEE SURGERY Bilateral 2005,2006   Repair   MOHS SURGERY     nose bcc, left cheeck Dr. Meredith Mody f/u Q 12/2020   REPLACEMENT TOTAL KNEE Left 04/11/2015   manipulation--05/07/2015   ROTATOR CUFF REPAIR W/ DISTAL CLAVICLE EXCISION     SHOULDER SURGERY Right    x2   SKIN  CANCER EXCISION     TOTAL KNEE ARTHROPLASTY     VAGINAL HYSTERECTOMY       Social History   Tobacco Use   Smoking status: Never   Smokeless tobacco: Never  Vaping Use   Vaping status: Never Used  Substance Use Topics   Alcohol use: No   Drug use: No      Family History  Problem Relation Age of Onset   Cancer Mother        lung   Hypertension Sister    Heart attack Sister 70   Breast cancer Paternal Aunt     Allergies  Allergen Reactions   Hydrochlorothiazide Other (See Comments)    Hyponatremia    Adhesive [Tape] Rash    redness     REVIEW OF SYSTEMS (Negative unless checked)   Constitutional: [] Weight loss  [] Fever  [] Chills Cardiac: [] Chest pain   [] Chest pressure   [] Palpitations   [] Shortness of breath when laying flat   [] Shortness of breath at rest   [] Shortness of breath with exertion. Vascular:  [x] Pain in legs with walking   [] Pain in legs at rest   [] Pain in legs when laying flat   [x] Claudication   [] Pain in feet when walking  [] Pain in feet at rest  [] Pain in feet when laying flat   [] History of DVT   [] Phlebitis   [] Swelling in legs   [] Varicose veins   [] Non-healing ulcers Pulmonary:   [] Uses home oxygen   [] Productive cough   [] Hemoptysis   [] Wheeze  [] COPD   [] Asthma Neurologic:  [] Dizziness  [] Blackouts   [] Seizures   [] History of stroke   [] History of TIA  [] Aphasia   [] Temporary blindness   [] Dysphagia   [] Weakness or numbness in arms   [] Weakness or numbness in legs Musculoskeletal:  [x] Arthritis   [] Joint swelling   [x] Joint pain   [] Low back pain Hematologic:  [] Easy bruising  [] Easy bleeding   [] Hypercoagulable state   [] Anemic   Gastrointestinal:  [] Blood in stool   [] Vomiting blood  [] Gastroesophageal reflux/heartburn   [] Abdominal pain Genitourinary:  [] Chronic kidney disease   [] Difficult urination  [] Frequent urination  [] Burning with urination   [] Hematuria Skin:  [] Rashes   [] Ulcers   [] Wounds Psychological:  [] History of anxiety   []   History of major depression.  Physical Examination  BP (!) 155/78 (BP Location: Left Arm)   Pulse 67   Resp 18   Ht 4\' 11"  (1.499 m)   Wt 169 lb 3.2 oz (76.7 kg)   BMI 34.17 kg/m  Gen:  WD/WN, NAD. Appears younger than stated  age. Head: [place]/AT, No temporalis wasting. Ear/Nose/Throat: Hearing grossly intact, nares w/o erythema or drainage Eyes: Conjunctiva clear. Sclera non-icteric Neck: Supple.  Trachea midline Pulmonary:  Good air movement, no use of accessory muscles.  Cardiac: RRR, no JVD Vascular:  Vessel Right Left  Radial Palpable Palpable                          PT 1+ Palpable 2+ Palpable  DP 1+ Palpable 1+ Palpable   Gastrointestinal: soft, non-tender/non-distended. No guarding/reflex.  Musculoskeletal: M/S 5/5 throughout.  No deformity or atrophy. Mild BLE edema. Neurologic: Sensation grossly intact in extremities.  Symmetrical.  Speech is fluent.  Psychiatric: Judgment intact, Mood & affect appropriate for pt's clinical situation. Dermatologic: No rashes or ulcers noted.  No cellulitis or open wounds.      Labs Recent Results (from the past 2160 hour(s))  CBC with Differential/Platelet     Status: Abnormal   Collection Time: 09/21/23  9:39 AM  Result Value Ref Range   WBC 5.4 4.0 - 10.5 K/uL   RBC 3.49 (L) 3.87 - 5.11 Mil/uL   Hemoglobin 11.4 (L) 12.0 - 15.0 g/dL   HCT 55.7 (L) 32.2 - 02.5 %   MCV 98.4 78.0 - 100.0 fl   MCHC 33.1 30.0 - 36.0 g/dL   RDW 42.7 06.2 - 37.6 %   Platelets 274.0 150.0 - 400.0 K/uL   Neutrophils Relative % 64.2 43.0 - 77.0 %   Lymphocytes Relative 23.0 12.0 - 46.0 %   Monocytes Relative 10.3 3.0 - 12.0 %   Eosinophils Relative 2.1 0.0 - 5.0 %   Basophils Relative 0.4 0.0 - 3.0 %   Neutro Abs 3.5 1.4 - 7.7 K/uL   Lymphs Abs 1.2 0.7 - 4.0 K/uL   Monocytes Absolute 0.6 0.1 - 1.0 K/uL   Eosinophils Absolute 0.1 0.0 - 0.7 K/uL   Basophils Absolute 0.0 0.0 - 0.1 K/uL  Hemoglobin A1c     Status: None   Collection Time:  09/21/23  9:39 AM  Result Value Ref Range   Hgb A1c MFr Bld 6.0 4.6 - 6.5 %    Comment: Glycemic Control Guidelines for People with Diabetes:Non Diabetic:  <6%Goal of Therapy: <7%Additional Action Suggested:  >8%   B12 and Folate Panel     Status: None   Collection Time: 09/21/23  9:39 AM  Result Value Ref Range   Vitamin B-12 662 211 - 911 pg/mL   Folate 10.6 >5.9 ng/mL  Iron, TIBC and Ferritin Panel     Status: None   Collection Time: 09/21/23  9:39 AM  Result Value Ref Range   Iron 81 45 - 160 mcg/dL   TIBC 283 151 - 761 mcg/dL (calc)   %SAT 20 16 - 45 % (calc)   Ferritin 45 16 - 288 ng/mL  Lipid Profile     Status: None   Collection Time: 09/21/23  9:39 AM  Result Value Ref Range   Cholesterol 142 0 - 200 mg/dL    Comment: ATP III Classification       Desirable:  < 200 mg/dL               Borderline High:  200 - 239 mg/dL          High:  > = 607 mg/dL   Triglycerides 37.1 0.0 - 149.0 mg/dL    Comment: Normal:  <062 mg/dLBorderline High:  150 - 199 mg/dL   HDL 69.48 >54.62  mg/dL   VLDL 81.0 0.0 - 17.5 mg/dL   LDL Cholesterol 69 0 - 99 mg/dL   Total CHOL/HDL Ratio 3     Comment:                Men          Women1/2 Average Risk     3.4          3.3Average Risk          5.0          4.42X Average Risk          9.6          7.13X Average Risk          15.0          11.0                       NonHDL 88.43     Comment: NOTE:  Non-HDL goal should be 30 mg/dL higher than patient's LDL goal (i.e. LDL goal of < 70 mg/dL, would have non-HDL goal of < 100 mg/dL)  VAS [place] ABI WITH/WO TBI     Status: None   Collection Time: 09/28/23  8:45 AM  Result Value Ref Range   Right ABI 1.06    Left ABI 1.11     Radiology No results found.  Assessment/Plan  Atherosclerosis of native arteries of extremity with intermittent claudication (HCC) We had a long discussion today regarding the pathophysiology and treatment options for peripheral arterial disease.  She certainly does not have any limb  threatening symptoms, but I think her symptoms of pain with activity are in part related to her peripheral arterial disease.  She does have significant disease on the right side by duplex.  I discussed that if we perform intervention, we will not be fixing her arthritic or neurogenic causes of her pain so I do not think that she should expect a complete recovery or cure with no pain.  That is not a likely outcome.  She understands this but says her symptoms are severe enough even if she can get some degree of relief she is willing to have the procedure done.  We discussed the risk and benefits of angiography and possible concomitant revascularization.  Patient voices her understanding and desires to proceed with right lower extremity angiogram and possible revascularization.  Essential hypertension blood pressure control important in reducing the progression of atherosclerotic disease. On appropriate oral medications.     HLD (hyperlipidemia) lipid control important in reducing the progression of atherosclerotic disease. On Fish oil and Tricor  Festus Barren, MD  11/16/2023 3:13 PM    This note was created with Dragon medical transcription system.  Any errors from dictation are purely unintentional

## 2023-11-16 NOTE — Progress Notes (Signed)
MRN : [id number]  Kristen Powell is a 77 y.o. ([birth year]) female who presents with chief complaint of  Chief Complaint  Patient presents with   Follow-up    consult to talk about procedure  .  History of Present Illness: Patient returns today in follow up of peripheral arterial disease.  She has a litany of things that cause her lower extremity pain including severe arthritis, neurogenic pain from her back, as well as peripheral arterial disease.  She has some pain even at rest, but the more severely affecting issue currently is the pain with activity.  She has to stop and rest and she is still very active and working and this is quite bothersome to her.  She has previously had a noninvasive study showing her ABIs to be preserved, but having hemodynamically significant stenosis by duplex in the right SFA.  She was originally slated to come back in the spring, but due to worsening pain with activity she comes back earlier to discuss potential revascularization.  She denies any open wounds or infection.  This does not have typical symptoms of ischemic rest pain.  Current Outpatient Medications  Medication Sig Dispense Refill   acetaminophen (TYLENOL) 500 MG tablet Take 1-2 tablets (500-1,000 mg total) by mouth every 4 (four) hours as needed for fever. 30 tablet 0   amLODipine (NORVASC) 10 MG tablet Take 1 tablet (10 mg total) by mouth daily. 90 tablet 3   aspirin 81 MG tablet Take 81 mg by mouth daily.     atorvastatin (LIPITOR) 40 MG tablet Take 1 tablet (40 mg total) by mouth daily. 90 tablet 3   calcium carbonate (OS-CAL) 600 MG TABS tablet Take 600 mg by mouth daily as needed.     carvedilol (COREG) 12.5 MG tablet Take 1 tablet (12.5 mg total) by mouth 2 (two) times daily with a meal. 180 tablet 1   Cholecalciferol (VITAMIN D) 2000 UNITS CAPS Take by mouth daily. Patient is taking 3000 units instead of 2000     citalopram (CELEXA) 10 MG tablet Take 1 tablet (10 mg total) by mouth daily.  90 tablet 3   fenofibrate (TRICOR) 145 MG tablet TAKE 1 TABLET BY MOUTH EVERY DAY 90 tablet 3   gabapentin (NEURONTIN) 100 MG capsule Take 1 capsule (100 mg total) by mouth at bedtime. 90 capsule 3   LORazepam (ATIVAN) 0.5 MG tablet Take 0.5-1 tablets (0.25-0.5 mg total) by mouth daily as needed for anxiety. 30 tablet 2   losartan (COZAAR) 100 MG tablet TAKE 1 TABLET (100 MG TOTAL) BY MOUTH DAILY. D/C 50 MG 90 tablet 3   Omega-3 Fatty Acids (FISH OIL) 1200 MG CAPS Take 1 capsule by mouth 2 (two) times daily.     omeprazole (PRILOSEC) 20 MG capsule Take 1 capsule (20 mg total) by mouth daily.     vitamin B-12 (CYANOCOBALAMIN) 1000 MCG tablet Take 1,000 mcg by mouth daily.     No current facility-administered medications for this visit.    Past Medical History:  Diagnosis Date   Anxiety    COVID-19    Diastolic dysfunction    a. 12/2021 Echo: EF 60-65%, no rwma, GrI DD, nl RV fxn, RVSP 36.5mmHg, mild MR.   Hyperlipidemia    Hypertension    Mild Mitral regurgitation    a. 12/2021 Echo: Mild MR.   Psoriasis    younger age   Spinal stenosis    Traumatic rupture of left ear drum  Past Surgical History:  Procedure Laterality Date   ABDOMINAL HYSTERECTOMY  12/14/1978   Partial   BASAL CELL CARCINOMA EXCISION  2006,2008,2011   BUNIONECTOMY     BUNIONECTOMY Bilateral 12/14/1990   COLONOSCOPY WITH PROPOFOL N/A 12/09/2021   Procedure: COLONOSCOPY WITH PROPOFOL;  Surgeon: Midge Minium, MD;  Location: [facility] ENDOSCOPY;  Service: Endoscopy;  Laterality: N/A;   KNEE ARTHROSCOPY     KNEE CLOSED REDUCTION  05/07/2015   Procedure: CLOSED MANIPULATION KNEE;  Surgeon: Kennedy Bucker, MD;  Location: [facility] ORS;  Service: Orthopedics;;   KNEE SURGERY Bilateral 2005,2006   Repair   MOHS SURGERY     nose bcc, left cheeck Dr. Meredith Mody f/u Q 12/2020   REPLACEMENT TOTAL KNEE Left 04/11/2015   manipulation--05/07/2015   ROTATOR CUFF REPAIR W/ DISTAL CLAVICLE EXCISION     SHOULDER SURGERY Right    x2   SKIN  CANCER EXCISION     TOTAL KNEE ARTHROPLASTY     VAGINAL HYSTERECTOMY       Social History   Tobacco Use   Smoking status: Never   Smokeless tobacco: Never  Vaping Use   Vaping status: Never Used  Substance Use Topics   Alcohol use: No   Drug use: No      Family History  Problem Relation Age of Onset   Cancer Mother        lung   Hypertension Sister    Heart attack Sister 70   Breast cancer Paternal Aunt     Allergies  Allergen Reactions   Hydrochlorothiazide Other (See Comments)    Hyponatremia    Adhesive [Tape] Rash    redness     REVIEW OF SYSTEMS (Negative unless checked)   Constitutional: [] Weight loss  [] Fever  [] Chills Cardiac: [] Chest pain   [] Chest pressure   [] Palpitations   [] Shortness of breath when laying flat   [] Shortness of breath at rest   [] Shortness of breath with exertion. Vascular:  [x] Pain in legs with walking   [] Pain in legs at rest   [] Pain in legs when laying flat   [x] Claudication   [] Pain in feet when walking  [] Pain in feet at rest  [] Pain in feet when laying flat   [] History of DVT   [] Phlebitis   [] Swelling in legs   [] Varicose veins   [] Non-healing ulcers Pulmonary:   [] Uses home oxygen   [] Productive cough   [] Hemoptysis   [] Wheeze  [] COPD   [] Asthma Neurologic:  [] Dizziness  [] Blackouts   [] Seizures   [] History of stroke   [] History of TIA  [] Aphasia   [] Temporary blindness   [] Dysphagia   [] Weakness or numbness in arms   [] Weakness or numbness in legs Musculoskeletal:  [x] Arthritis   [] Joint swelling   [x] Joint pain   [] Low back pain Hematologic:  [] Easy bruising  [] Easy bleeding   [] Hypercoagulable state   [] Anemic   Gastrointestinal:  [] Blood in stool   [] Vomiting blood  [] Gastroesophageal reflux/heartburn   [] Abdominal pain Genitourinary:  [] Chronic kidney disease   [] Difficult urination  [] Frequent urination  [] Burning with urination   [] Hematuria Skin:  [] Rashes   [] Ulcers   [] Wounds Psychological:  [] History of anxiety   []   History of major depression.  Physical Examination  BP (!) 155/78 (BP Location: Left Arm)   Pulse 67   Resp 18   Ht 4\' 11"  (1.499 m)   Wt 169 lb 3.2 oz (76.7 kg)   BMI 34.17 kg/m  Gen:  WD/WN, NAD. Appears younger than stated  age. Head: [place]/AT, No temporalis wasting. Ear/Nose/Throat: Hearing grossly intact, nares w/o erythema or drainage Eyes: Conjunctiva clear. Sclera non-icteric Neck: Supple.  Trachea midline Pulmonary:  Good air movement, no use of accessory muscles.  Cardiac: RRR, no JVD Vascular:  Vessel Right Left  Radial Palpable Palpable                          PT 1+ Palpable 2+ Palpable  DP 1+ Palpable 1+ Palpable   Gastrointestinal: soft, non-tender/non-distended. No guarding/reflex.  Musculoskeletal: M/S 5/5 throughout.  No deformity or atrophy. Mild BLE edema. Neurologic: Sensation grossly intact in extremities.  Symmetrical.  Speech is fluent.  Psychiatric: Judgment intact, Mood & affect appropriate for pt's clinical situation. Dermatologic: No rashes or ulcers noted.  No cellulitis or open wounds.      Labs Recent Results (from the past 2160 hour(s))  CBC with Differential/Platelet     Status: Abnormal   Collection Time: 09/21/23  9:39 AM  Result Value Ref Range   WBC 5.4 4.0 - 10.5 K/uL   RBC 3.49 (L) 3.87 - 5.11 Mil/uL   Hemoglobin 11.4 (L) 12.0 - 15.0 g/dL   HCT 55.7 (L) 32.2 - 02.5 %   MCV 98.4 78.0 - 100.0 fl   MCHC 33.1 30.0 - 36.0 g/dL   RDW 42.7 06.2 - 37.6 %   Platelets 274.0 150.0 - 400.0 K/uL   Neutrophils Relative % 64.2 43.0 - 77.0 %   Lymphocytes Relative 23.0 12.0 - 46.0 %   Monocytes Relative 10.3 3.0 - 12.0 %   Eosinophils Relative 2.1 0.0 - 5.0 %   Basophils Relative 0.4 0.0 - 3.0 %   Neutro Abs 3.5 1.4 - 7.7 K/uL   Lymphs Abs 1.2 0.7 - 4.0 K/uL   Monocytes Absolute 0.6 0.1 - 1.0 K/uL   Eosinophils Absolute 0.1 0.0 - 0.7 K/uL   Basophils Absolute 0.0 0.0 - 0.1 K/uL  Hemoglobin A1c     Status: None   Collection Time:  09/21/23  9:39 AM  Result Value Ref Range   Hgb A1c MFr Bld 6.0 4.6 - 6.5 %    Comment: Glycemic Control Guidelines for People with Diabetes:Non Diabetic:  <6%Goal of Therapy: <7%Additional Action Suggested:  >8%   B12 and Folate Panel     Status: None   Collection Time: 09/21/23  9:39 AM  Result Value Ref Range   Vitamin B-12 662 211 - 911 pg/mL   Folate 10.6 >5.9 ng/mL  Iron, TIBC and Ferritin Panel     Status: None   Collection Time: 09/21/23  9:39 AM  Result Value Ref Range   Iron 81 45 - 160 mcg/dL   TIBC 283 151 - 761 mcg/dL (calc)   %SAT 20 16 - 45 % (calc)   Ferritin 45 16 - 288 ng/mL  Lipid Profile     Status: None   Collection Time: 09/21/23  9:39 AM  Result Value Ref Range   Cholesterol 142 0 - 200 mg/dL    Comment: ATP III Classification       Desirable:  < 200 mg/dL               Borderline High:  200 - 239 mg/dL          High:  > = 607 mg/dL   Triglycerides 37.1 0.0 - 149.0 mg/dL    Comment: Normal:  <062 mg/dLBorderline High:  150 - 199 mg/dL   HDL 69.48 >54.62  mg/dL   VLDL 81.0 0.0 - 17.5 mg/dL   LDL Cholesterol 69 0 - 99 mg/dL   Total CHOL/HDL Ratio 3     Comment:                Men          Women1/2 Average Risk     3.4          3.3Average Risk          5.0          4.42X Average Risk          9.6          7.13X Average Risk          15.0          11.0                       NonHDL 88.43     Comment: NOTE:  Non-HDL goal should be 30 mg/dL higher than patient's LDL goal (i.e. LDL goal of < 70 mg/dL, would have non-HDL goal of < 100 mg/dL)  VAS [place] ABI WITH/WO TBI     Status: None   Collection Time: 09/28/23  8:45 AM  Result Value Ref Range   Right ABI 1.06    Left ABI 1.11     Radiology No results found.  Assessment/Plan  Atherosclerosis of native arteries of extremity with intermittent claudication (HCC) We had a long discussion today regarding the pathophysiology and treatment options for peripheral arterial disease.  She certainly does not have any limb  threatening symptoms, but I think her symptoms of pain with activity are in part related to her peripheral arterial disease.  She does have significant disease on the right side by duplex.  I discussed that if we perform intervention, we will not be fixing her arthritic or neurogenic causes of her pain so I do not think that she should expect a complete recovery or cure with no pain.  That is not a likely outcome.  She understands this but says her symptoms are severe enough even if she can get some degree of relief she is willing to have the procedure done.  We discussed the risk and benefits of angiography and possible concomitant revascularization.  Patient voices her understanding and desires to proceed with right lower extremity angiogram and possible revascularization.  Essential hypertension blood pressure control important in reducing the progression of atherosclerotic disease. On appropriate oral medications.     HLD (hyperlipidemia) lipid control important in reducing the progression of atherosclerotic disease. On Fish oil and Tricor  Festus Barren, MD  11/16/2023 3:13 PM    This note was created with Dragon medical transcription system.  Any errors from dictation are purely unintentional

## 2023-11-16 NOTE — Assessment & Plan Note (Signed)
We had a long discussion today regarding the pathophysiology and treatment options for peripheral arterial disease.  She certainly does not have any limb threatening symptoms, but I think her symptoms of pain with activity are in part related to her peripheral arterial disease.  She does have significant disease on the right side by duplex.  I discussed that if we perform intervention, we will not be fixing her arthritic or neurogenic causes of her pain so I do not think that she should expect a complete recovery or cure with no pain.  That is not a likely outcome.  She understands this but says her symptoms are severe enough even if she can get some degree of relief she is willing to have the procedure done.  We discussed the risk and benefits of angiography and possible concomitant revascularization.  Patient voices her understanding and desires to proceed with right lower extremity angiogram and possible revascularization.

## 2023-11-17 ENCOUNTER — Telehealth (INDEPENDENT_AMBULATORY_CARE_PROVIDER_SITE_OTHER): Payer: Self-pay

## 2023-11-17 NOTE — Telephone Encounter (Signed)
Spoke with the patient and she is scheduled with Dr. Wyn Quaker for a right leg angio on 11/22/23 with a 10:00 am arrival time to the [facility]. Pre-procedure instructions were discussed and will be sent to Mychart and mailed.

## 2023-11-18 ENCOUNTER — Other Ambulatory Visit: Payer: Medicare Other

## 2023-11-18 DIAGNOSIS — R300 Dysuria: Secondary | ICD-10-CM | POA: Diagnosis not present

## 2023-11-18 DIAGNOSIS — D649 Anemia, unspecified: Secondary | ICD-10-CM | POA: Diagnosis not present

## 2023-11-18 LAB — CBC WITH DIFFERENTIAL/PLATELET
Basophils Absolute: 0 10*3/uL (ref 0.0–0.1)
Basophils Relative: 0.2 % (ref 0.0–3.0)
Eosinophils Absolute: 0.2 10*3/uL (ref 0.0–0.7)
Eosinophils Relative: 2.5 % (ref 0.0–5.0)
HCT: 33 % — ABNORMAL LOW (ref 36.0–46.0)
Hemoglobin: 11.4 g/dL — ABNORMAL LOW (ref 12.0–15.0)
Lymphocytes Relative: 25.4 % (ref 12.0–46.0)
Lymphs Abs: 1.6 10*3/uL (ref 0.7–4.0)
MCHC: 34.6 g/dL (ref 30.0–36.0)
MCV: 98.7 fL (ref 78.0–100.0)
Monocytes Absolute: 0.7 10*3/uL (ref 0.1–1.0)
Monocytes Relative: 11.7 % (ref 3.0–12.0)
Neutro Abs: 3.8 10*3/uL (ref 1.4–7.7)
Neutrophils Relative %: 60.2 % (ref 43.0–77.0)
Platelets: 269 10*3/uL (ref 150.0–400.0)
RBC: 3.34 Mil/uL — ABNORMAL LOW (ref 3.87–5.11)
RDW: 13.5 % (ref 11.5–15.5)
WBC: 6.3 10*3/uL (ref 4.0–10.5)

## 2023-11-18 LAB — URINALYSIS, ROUTINE W REFLEX MICROSCOPIC
Bilirubin Urine: NEGATIVE
Hgb urine dipstick: NEGATIVE
Ketones, ur: NEGATIVE
Nitrite: POSITIVE — AB
Specific Gravity, Urine: 1.01 (ref 1.000–1.030)
Total Protein, Urine: NEGATIVE
Urine Glucose: NEGATIVE
Urobilinogen, UA: 0.2 (ref 0.0–1.0)
pH: 7 (ref 5.0–8.0)

## 2023-11-19 ENCOUNTER — Telehealth: Payer: Self-pay | Admitting: Family

## 2023-11-19 ENCOUNTER — Other Ambulatory Visit (INDEPENDENT_AMBULATORY_CARE_PROVIDER_SITE_OTHER): Payer: Medicare Other

## 2023-11-19 DIAGNOSIS — R8271 Bacteriuria: Secondary | ICD-10-CM | POA: Diagnosis not present

## 2023-11-19 DIAGNOSIS — R899 Unspecified abnormal finding in specimens from other organs, systems and tissues: Secondary | ICD-10-CM | POA: Diagnosis not present

## 2023-11-19 NOTE — Telephone Encounter (Signed)
LVM for pt to call back to inform pt that   We need more urine , not enough from yesterday, as concerned for UTI  Please triage for dysuria, fever, nausea or any concerning features today?  If asymptomatic, we can await urine culture however this may take 3 days.  If symptomatic, I would likely start empiric antibiotic

## 2023-11-19 NOTE — Telephone Encounter (Signed)
Noted  

## 2023-11-19 NOTE — Telephone Encounter (Signed)
Can we add urine culture to pt urine today?

## 2023-11-19 NOTE — Telephone Encounter (Signed)
Patient states she is not having pain when urinating, no fever, no nausea.  Patient states she is due to go into hospital for outpatient surgery on Monday for a blocked artery in her right leg with Dr. Wyn Quaker.  Patient is not having any symptoms.  I scheduled a lab visit for patient to give more urine today at 3pm.

## 2023-11-21 LAB — URINE CULTURE
MICRO NUMBER:: 15819120
SPECIMEN QUALITY:: ADEQUATE

## 2023-11-22 ENCOUNTER — Ambulatory Visit
Admission: RE | Admit: 2023-11-22 | Discharge: 2023-11-22 | Disposition: A | Discharge status: Home / Self Care | Payer: Medicare Other | Source: Ambulatory Visit | Attending: Vascular Surgery | Admitting: Vascular Surgery

## 2023-11-22 ENCOUNTER — Telehealth: Payer: Self-pay | Admitting: Family

## 2023-11-22 ENCOUNTER — Encounter
Admission: RE | Disposition: A | Discharge status: Home / Self Care | Payer: Self-pay | Source: Ambulatory Visit | Attending: Vascular Surgery

## 2023-11-22 ENCOUNTER — Encounter: Payer: Self-pay | Admitting: Vascular Surgery

## 2023-11-22 ENCOUNTER — Encounter: Payer: Self-pay | Admitting: Anesthesiology

## 2023-11-22 DIAGNOSIS — I70211 Atherosclerosis of native arteries of extremities with intermittent claudication, right leg: Secondary | ICD-10-CM | POA: Insufficient documentation

## 2023-11-22 DIAGNOSIS — I70219 Atherosclerosis of native arteries of extremities with intermittent claudication, unspecified extremity: Secondary | ICD-10-CM

## 2023-11-22 DIAGNOSIS — I10 Essential (primary) hypertension: Secondary | ICD-10-CM | POA: Diagnosis not present

## 2023-11-22 DIAGNOSIS — Z8616 Personal history of COVID-19: Secondary | ICD-10-CM | POA: Diagnosis not present

## 2023-11-22 DIAGNOSIS — E785 Hyperlipidemia, unspecified: Secondary | ICD-10-CM | POA: Insufficient documentation

## 2023-11-22 DIAGNOSIS — N3000 Acute cystitis without hematuria: Secondary | ICD-10-CM

## 2023-11-22 HISTORY — PX: LOWER EXTREMITY ANGIOGRAPHY: CATH118251

## 2023-11-22 HISTORY — PX: PERIPHERAL VASCULAR CATHETERIZATION: SHX172C

## 2023-11-22 LAB — BUN: BUN: 25 mg/dL — ABNORMAL HIGH (ref 8–23)

## 2023-11-22 LAB — CREATININE, SERUM
Creatinine, Ser: 1.03 mg/dL — ABNORMAL HIGH (ref 0.44–1.00)
GFR, Estimated: 56 mL/min — ABNORMAL LOW (ref >60)

## 2023-11-22 SURGERY — LOWER EXTREMITY ANGIOGRAPHY
Anesthesia: Moderate Sedation | Site: Leg Lower | Laterality: Right

## 2023-11-22 MED ORDER — ACETAMINOPHEN 325 MG PO TABS: 650.0000 mg | ORAL_TABLET | ORAL | Status: DC | PRN

## 2023-11-22 MED ORDER — CLOPIDOGREL BISULFATE 75 MG PO TABS: 75.0000 mg | ORAL_TABLET | Freq: Every day | ORAL | 11 refills | Status: DC

## 2023-11-22 MED ORDER — HEPARIN SODIUM (PORCINE) 1000 UNIT/ML IJ SOLN
INTRAMUSCULAR | Status: AC
  Filled 2023-11-22: qty 10

## 2023-11-22 MED ORDER — FAMOTIDINE 20 MG PO TABS: 40.0000 mg | ORAL_TABLET | Freq: Once | ORAL | Status: DC | PRN

## 2023-11-22 MED ORDER — CEFAZOLIN SODIUM-DEXTROSE 2-4 GM/100ML-% IV SOLN
2.0000 g | INTRAVENOUS | Status: AC
  Administered 2023-11-22: 2 g via INTRAVENOUS

## 2023-11-22 MED ORDER — SODIUM CHLORIDE 0.9 % IV SOLN
250.0000 mL | INTRAVENOUS | Status: DC | PRN
Start: 2023-11-22 — End: 2023-11-22

## 2023-11-22 MED ORDER — CEFAZOLIN SODIUM-DEXTROSE 2-4 GM/100ML-% IV SOLN
INTRAVENOUS | Status: AC
  Filled 2023-11-22: qty 100

## 2023-11-22 MED ORDER — MIDAZOLAM HCL 5 MG/5ML IJ SOLN
INTRAMUSCULAR | Status: AC
  Filled 2023-11-22: qty 5

## 2023-11-22 MED ORDER — FENTANYL CITRATE (PF) 100 MCG/2ML IJ SOLN
INTRAMUSCULAR | Status: DC | PRN
  Administered 2023-11-22: 50 ug via INTRAVENOUS

## 2023-11-22 MED ORDER — SODIUM CHLORIDE 0.9% FLUSH: 3.0000 mL | INTRAVENOUS | Status: DC | PRN

## 2023-11-22 MED ORDER — MIDAZOLAM HCL 2 MG/ML PO SYRP: 8.0000 mg | ORAL_SOLUTION | Freq: Once | ORAL | Status: DC | PRN

## 2023-11-22 MED ORDER — ONDANSETRON HCL 4 MG/2ML IJ SOLN: 4.0000 mg | Freq: Four times a day (QID) | INTRAMUSCULAR | Status: DC | PRN

## 2023-11-22 MED ORDER — LABETALOL HCL 5 MG/ML IV SOLN: 10.0000 mg | INTRAVENOUS | Status: DC | PRN

## 2023-11-22 MED ORDER — DIPHENHYDRAMINE HCL 50 MG/ML IJ SOLN: 50.0000 mg | Freq: Once | INTRAMUSCULAR | Status: DC | PRN

## 2023-11-22 MED ORDER — LIDOCAINE-EPINEPHRINE (PF) 1 %-1:200000 IJ SOLN
INTRAMUSCULAR | Status: DC | PRN
  Administered 2023-11-22: 10 mL

## 2023-11-22 MED ORDER — HEPARIN (PORCINE) IN NACL 1000-0.9 UT/500ML-% IV SOLN
INTRAVENOUS | Status: DC | PRN
  Administered 2023-11-22: 1000 mL

## 2023-11-22 MED ORDER — SODIUM CHLORIDE 0.9 % IV SOLN: INTRAVENOUS | Status: DC

## 2023-11-22 MED ORDER — MIDAZOLAM HCL 2 MG/2ML IJ SOLN
INTRAMUSCULAR | Status: DC | PRN
  Administered 2023-11-22: 1 mg via INTRAVENOUS

## 2023-11-22 MED ORDER — METHYLPREDNISOLONE SODIUM SUCC 125 MG IJ SOLR: 125.0000 mg | Freq: Once | INTRAMUSCULAR | Status: DC | PRN

## 2023-11-22 MED ORDER — HYDRALAZINE HCL 20 MG/ML IJ SOLN: 5.0000 mg | INTRAMUSCULAR | Status: DC | PRN

## 2023-11-22 MED ORDER — FENTANYL CITRATE (PF) 100 MCG/2ML IJ SOLN
INTRAMUSCULAR | Status: AC
  Filled 2023-11-22: qty 2

## 2023-11-22 MED ORDER — SODIUM CHLORIDE 0.9% FLUSH: 3.0000 mL | Freq: Two times a day (BID) | INTRAVENOUS | Status: DC

## 2023-11-22 MED ORDER — CLOPIDOGREL BISULFATE 75 MG PO TABS: 75.0000 mg | ORAL_TABLET | Freq: Every day | ORAL | Status: DC

## 2023-11-22 MED ORDER — HYDROMORPHONE HCL 1 MG/ML IJ SOLN: 1.0000 mg | Freq: Once | INTRAMUSCULAR | Status: DC | PRN

## 2023-11-22 MED ORDER — IODIXANOL 320 MG/ML IV SOLN
INTRAVENOUS | Status: DC | PRN
  Administered 2023-11-22: 45 mL

## 2023-11-22 MED ORDER — AMOXICILLIN-POT CLAVULANATE 875-125 MG PO TABS: 1.0000 | ORAL_TABLET | Freq: Two times a day (BID) | ORAL | 0 refills | Status: AC

## 2023-11-22 MED ORDER — HEPARIN SODIUM (PORCINE) 1000 UNIT/ML IJ SOLN
INTRAMUSCULAR | Status: DC | PRN
  Administered 2023-11-22: 5000 [IU] via INTRAVENOUS

## 2023-11-22 SURGICAL SUPPLY — 17 items
BALLN LUTONIX 018 5X150X130 (BALLOONS) ×1
BALLN ULTRVRSE 2.5X300X150 (BALLOONS) ×1
BALLOON LUTONIX 018 5X150X130 (BALLOONS) IMPLANT
BALLOON ULTRVRSE 2.5X300X150 (BALLOONS) IMPLANT
CATH ANGIO 5F PIGTAIL 65CM (CATHETERS) IMPLANT
CATH BEACON 5 .038 100 VERT TP (CATHETERS) IMPLANT
CATH ROTAREX 135 6FR (CATHETERS) IMPLANT
COVER PROBE ULTRASOUND 5X96 (MISCELLANEOUS) IMPLANT
DEVICE PRESTO INFLATION (MISCELLANEOUS) IMPLANT
DEVICE STARCLOSE SE CLOSURE (Vascular Products) IMPLANT
GLIDEWIRE ADV .035X260CM (WIRE) IMPLANT
PACK ANGIOGRAPHY (CUSTOM PROCEDURE TRAY) ×1 IMPLANT
SHEATH ANL2 6FRX45 HC (SHEATH) IMPLANT
SHEATH BRITE TIP 5FRX11 (SHEATH) IMPLANT
TUBING CONTRAST HIGH PRESS 72 (TUBING) IMPLANT
WIRE G V18X300CM (WIRE) IMPLANT
WIRE GUIDERIGHT .035X150 (WIRE) IMPLANT

## 2023-11-22 NOTE — Interval H&P Note (Signed)
History and Physical Interval Note:  11/22/2023 9:38 AM  Kristen Powell  has presented today for surgery, with the diagnosis of RLE Angio   ASO w claudication.  The various methods of treatment have been discussed with the patient and family. After consideration of risks, benefits and other options for treatment, the patient has consented to  Procedure(s): Lower Extremity Angiography (Right) as a surgical intervention.  The patient's history has been reviewed, patient examined, no change in status, stable for surgery.  I have reviewed the patient's chart and labs.  Questions were answered to the patient's satisfaction.     Festus Barren

## 2023-11-22 NOTE — Telephone Encounter (Signed)
LDVM for Pt and also sent pt a my chart message

## 2023-11-22 NOTE — Op Note (Signed)
[facility]  Percutaneous Study/Intervention Procedural Note   Date of Surgery: 11/22/2023  Surgeon(s):Noralyn Karim    Assistants:none  Pre-operative Diagnosis: PAD with claudication RLE  Post-operative diagnosis:  Same  Procedure(s) Performed:             1.  Ultrasound guidance for vascular access left femoral artery             2.  Catheter placement into right common femoral artery from left femoral approach             3.  Aortogram and selective right lower extremity angiogram             4.  Percutaneous transluminal angioplasty of right anterior tibial artery with 2.5 mm diameter by 30 cm length angioplasty balloon             5.  Atherectomy of the right SFA and above-knee popliteal artery with the [place] Rex device  6.  Percutaneous transluminal angioplasty of the right distal SFA and proximal popliteal artery with 5 mm diameter by 15 cm length Lutonix drug-coated             7.  StarClose closure device left femoral artery  EBL: 25 cc  Contrast: 45 cc  Fluoro Time: 3.4 minutes  Moderate Conscious Sedation Time: approximately 35 minutes using 1 mg of Versed and 50 mcg of Fentanyl              Indications:  Patient is a 77 y.o.female with worsening claudication symptoms of the lower extremities. The patient has noninvasive study showing that although her ABIs were fairly well-preserved, duplex showed a significant stenosis in the right distal SFA. The patient is brought in for angiography for further evaluation and potential treatment.  Risks and benefits are discussed and informed consent is obtained.   Procedure:  The patient was identified and appropriate procedural time out was performed.  The patient was then placed supine on the table and prepped and draped in the usual sterile fashion. Moderate conscious sedation was administered during a face to face encounter with the patient throughout the procedure with my supervision of the RN administering  medicines and monitoring the patient's vital signs, pulse oximetry, telemetry and mental status throughout from the start of the procedure until the patient was taken to the recovery room. Ultrasound was used to evaluate the left common femoral artery.  It was patent .  A digital ultrasound image was acquired.  A Seldinger needle was used to access the left common femoral artery under direct ultrasound guidance and a permanent image was performed.  A 0.035 J wire was advanced without resistance and a 5Fr sheath was placed.  Pigtail catheter was placed into the aorta and an AP aortogram was performed. This demonstrated normal renal arteries and normal aorta and iliac segments without significant stenosis. I then crossed the aortic bifurcation and advanced to the right femoral head. Selective right lower extremity angiogram was then performed. This demonstrated fairly normal common femoral artery, profunda femoris artery, and proximal superficial femoral artery.  In the mid to distal superficial femoral artery was about a 60% stenosis and then few centimeters further down in the distal SFA was about an 80 to 90% stenosis.  The above-knee popliteal artery normalized.  There was a typical tibial trifurcation although significant tibial disease was present.  The posterior tibial artery appears chronically occluded without distal reconstitution.  The peroneal artery was small but patent without stenosis.  The anterior tibial artery had diffuse moderate disease throughout the proximal and mid segments with several areas of greater than 50% stenosis but no stenoses appearing worse than 80%.  This was the largest vessel in the foot. It was felt that it was in the patient's best interest to proceed with intervention after these images to avoid a second procedure and a larger amount of contrast and fluoroscopy based off of the findings from the initial angiogram. The patient was systemically heparinized and a 6 [place] Ansell  sheath was then placed over the [employer] wire. I then used a Kumpe catheter and the advantage wire to navigate through the SFA disease and down into the anterior tibial artery.  I then exchanged for a V18 wire that was parked in the foot.  I then performed atherectomy of the right SFA and most proximal popliteal artery with 2 passes with the [place] Rex device.  This resulted in improvement in the channel but still greater than 50% residual stenosis in both areas of stenosis.  I then treated this with a 5 mm diameter by 15 cm length Lutonix drug-coated angioplasty balloon inflated to 8 atm for 1 minute.  To address the anterior tibial artery disease, a long 2.5 mm diameter by 30 cm length angioplasty balloon was inflated to 10 atm for 1 minute in the right anterior tibial artery.  Completion imaging following angioplasty in both location showed marked improvement.  There did not appear to be greater than 20% residual stenosis in the SFA or anterior tibial artery. I elected to terminate the procedure. The sheath was removed and StarClose closure device was deployed in the left femoral artery with excellent hemostatic result. The patient was taken to the recovery room in stable condition having tolerated the procedure well.  Findings:               Aortogram:  This demonstrated normal renal arteries and normal aorta and iliac segments without significant stenosis.             Right Lower Extremity:  This demonstrated fairly normal common femoral artery, profunda femoris artery, and proximal superficial femoral artery.  In the mid to distal superficial femoral artery was about a 60% stenosis and then few centimeters further down in the distal SFA was about an 80 to 90% stenosis.  The above-knee popliteal artery normalized.  There was a typical tibial trifurcation although significant tibial disease was present.  The posterior tibial artery appears chronically occluded without distal reconstitution.  The  peroneal artery was small but patent without stenosis.  The anterior tibial artery had diffuse moderate disease throughout the proximal and mid segments with several areas of greater than 50% stenosis but no stenoses appearing worse than 80%.  This was the largest vessel in the foot.   Disposition: Patient was taken to the recovery room in stable condition having tolerated the procedure well.  Complications: None  Festus Barren 11/22/2023 12:02 PM   This note was created with Dragon Medical transcription system. Any errors in dictation are purely unintentional.

## 2023-11-22 NOTE — Telephone Encounter (Signed)
Call pt She is at the hospital for vascular procedure  Appears she has urinary tract infection, e coli  She was given cefazolin , IV antibiotic once, however doesn't appear that this antibiotic would be susceptable.   I have sent in augmentin for you to start as an outpatient as I suspect she is going to be discharged today. If she is not going to be discharged today, please let me know so I can collaborate with Dr Wyn Quaker, vascular team.   Please reiterate the importance of probiotics while on Augmentin  Crtcl 53 ml/min

## 2023-11-22 NOTE — Progress Notes (Signed)
Dr. Wyn Quaker at bedside, speaking with pt. And her family re: procedural results. All persons verbalized understanding of conversation with MD.

## 2023-11-23 ENCOUNTER — Other Ambulatory Visit: Payer: Self-pay | Admitting: Family

## 2023-11-23 ENCOUNTER — Encounter: Payer: Self-pay | Admitting: Cardiology

## 2023-11-23 ENCOUNTER — Ambulatory Visit: Payer: Medicare Other | Attending: Cardiology | Admitting: Cardiology

## 2023-11-23 VITALS — BP 120/56 | HR 59 | Ht 59.0 in | Wt 165.6 lb

## 2023-11-23 DIAGNOSIS — I739 Peripheral vascular disease, unspecified: Secondary | ICD-10-CM | POA: Diagnosis not present

## 2023-11-23 DIAGNOSIS — E782 Mixed hyperlipidemia: Secondary | ICD-10-CM | POA: Diagnosis not present

## 2023-11-23 DIAGNOSIS — D649 Anemia, unspecified: Secondary | ICD-10-CM

## 2023-11-23 DIAGNOSIS — I10 Essential (primary) hypertension: Secondary | ICD-10-CM | POA: Diagnosis not present

## 2023-11-23 NOTE — Telephone Encounter (Signed)
See previous note pt has been contacted

## 2023-11-23 NOTE — Patient Instructions (Signed)

## 2023-11-23 NOTE — Progress Notes (Signed)
Cardiology Office Note:    Date:  11/23/2023   ID:  [name], [name] 08/01/1946, MRN [id number]  PCP:  Allegra Grana, FNP   [facility] Providers Cardiologist:  Debbe Odea, MD     Referring MD: Allegra Grana, FNP   Chief Complaint  Patient presents with   Follow-up    Patient denies new or acute cardiac problems/concerns today.  Patient had peripheral vascular craterization on right leg with Dr. Wyn Quaker yesterday.  Reports good outcome of procedure with leg pain relief today.    History of Present Illness:    Kristen Powell is a 77 y.o. female with a hx of hypertension, hyperlipidemia, PAD (75% right SFA stenosis s/p atherectomy and angioplasty of right SFA 11/22/2023), anxiety who presents for follow-up.    Denies chest pain or shortness of breath.  Underwent right anterior tibial artery angioplasty with vascular surgery yesterday.  Also arthrectomy of the right SFA.  States procedure went well, denies any symptoms of claudication.  Right leg pain is significantly improved.  Compliant with aspirin, Plavix, Lipitor as prescribed.  Prior notes Echo 1/23 EF 60 to 65%, mild MR, aortic valve not well-visualized. Renal arterial ultrasound 07/2021 with no evidence for stenosis.  Past Medical History:  Diagnosis Date   Anxiety    COVID-19    Diastolic dysfunction    a. 12/2021 Echo: EF 60-65%, no rwma, GrI DD, nl RV fxn, RVSP 36.52mmHg, mild MR.   Hyperlipidemia    Hypertension    Mild Mitral regurgitation    a. 12/2021 Echo: Mild MR.   Psoriasis    younger age   Spinal stenosis    Traumatic rupture of left ear drum     Past Surgical History:  Procedure Laterality Date   ABDOMINAL HYSTERECTOMY  12/14/1978   Partial   BASAL CELL CARCINOMA EXCISION  2006,2008,2011   BUNIONECTOMY     BUNIONECTOMY Bilateral 12/14/1990   COLONOSCOPY WITH PROPOFOL N/A 12/09/2021   Procedure: COLONOSCOPY WITH PROPOFOL;  Surgeon: Midge Minium, MD;  Location: [facility] ENDOSCOPY;   Service: Endoscopy;  Laterality: N/A;   KNEE ARTHROSCOPY     KNEE CLOSED REDUCTION  05/07/2015   Procedure: CLOSED MANIPULATION KNEE;  Surgeon: Kennedy Bucker, MD;  Location: [facility] ORS;  Service: Orthopedics;;   KNEE SURGERY Bilateral 2005,2006   Repair   LOWER EXTREMITY ANGIOGRAPHY Right 11/22/2023   Procedure: Lower Extremity Angiography;  Surgeon: Annice Needy, MD;  Location: [facility] INVASIVE CV LAB;  Service: Cardiovascular;  Laterality: Right;   MOHS SURGERY     nose bcc, left cheeck Dr. Meredith Mody f/u Q 12/2020   PERIPHERAL VASCULAR CATHETERIZATION  11/22/2023   REPLACEMENT TOTAL KNEE Left 04/11/2015   manipulation--05/07/2015   ROTATOR CUFF REPAIR W/ DISTAL CLAVICLE EXCISION     SHOULDER SURGERY Right    x2   SKIN CANCER EXCISION     TOTAL KNEE ARTHROPLASTY     VAGINAL HYSTERECTOMY      Current Medications: Current Meds  Medication Sig   acetaminophen (TYLENOL) 500 MG tablet Take 1-2 tablets (500-1,000 mg total) by mouth every 4 (four) hours as needed for fever.   amLODipine (NORVASC) 10 MG tablet Take 1 tablet (10 mg total) by mouth daily.   amoxicillin-clavulanate (AUGMENTIN) 875-125 MG tablet Take 1 tablet by mouth 2 (two) times daily for 7 days.   aspirin 81 MG tablet Take 81 mg by mouth daily.   atorvastatin (LIPITOR) 40 MG tablet Take 1 tablet (40 mg total) by mouth daily.  carvedilol (COREG) 12.5 MG tablet Take 1 tablet (12.5 mg total) by mouth 2 (two) times daily with a meal.   Cholecalciferol (VITAMIN D) 2000 UNITS CAPS Take by mouth daily. Patient is taking 3000 units instead of 2000   citalopram (CELEXA) 10 MG tablet Take 1 tablet (10 mg total) by mouth daily.   clopidogrel (PLAVIX) 75 MG tablet Take 1 tablet (75 mg total) by mouth daily.   fenofibrate (TRICOR) 145 MG tablet TAKE 1 TABLET BY MOUTH EVERY DAY   gabapentin (NEURONTIN) 100 MG capsule Take 1 capsule (100 mg total) by mouth at bedtime.   LORazepam (ATIVAN) 0.5 MG tablet Take 0.5-1 tablets (0.25-0.5 mg total) by  mouth daily as needed for anxiety.   losartan (COZAAR) 100 MG tablet TAKE 1 TABLET (100 MG TOTAL) BY MOUTH DAILY. D/C 50 MG   Omega-3 Fatty Acids (FISH OIL) 1200 MG CAPS Take 1 capsule by mouth 2 (two) times daily.   omeprazole (PRILOSEC) 20 MG capsule Take 1 capsule (20 mg total) by mouth daily.   vitamin B-12 (CYANOCOBALAMIN) 1000 MCG tablet Take 1,000 mcg by mouth daily.     Allergies:   Hydrochlorothiazide and Adhesive [tape]   Social History   Socioeconomic History   Marital status: Widowed    Spouse name: Not on file   Number of children: 3   Years of education: Not on file   Highest education level: Not on file  Occupational History   Not on file  Tobacco Use   Smoking status: Never   Smokeless tobacco: Never  Vaping Use   Vaping status: Never Used  Substance and Sexual Activity   Alcohol use: No   Drug use: No   Sexual activity: Never  Other Topics Concern   Not on file  Social History Narrative   Widowed x 15 years as of 06/2021    3 sisters and 1 brother    Has children son age 93, daughter 28, son 25as of 11/2023   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      52 great grandchildren         Social Determinants of Health   Financial Resource Strain: Low Risk  (08/24/2023)   Overall Financial Resource Strain (CARDIA)    Difficulty of Paying Living Expenses: Not hard at all  Food Insecurity: No Food Insecurity (08/24/2023)   Hunger Vital Sign    Worried About Running Out of Food in the Last Year: Never true    Ran Out of Food in the Last Year: Never true  Transportation Needs: No Transportation Needs (08/24/2023)   PRAPARE - Administrator, Civil Service (Medical): No    Lack of Transportation (Non-Medical): No  Physical Activity: Insufficiently Active (08/24/2023)   Exercise Vital Sign    Days of Exercise per Week: 2 days    Minutes of Exercise per Session: 20 min  Stress: No Stress Concern Present (08/24/2023)   [employer] of Occupational  Health - Occupational Stress Questionnaire    Feeling of Stress : Not at all  Social Connections: Moderately Integrated (08/24/2023)   Social Connection and Isolation Panel [NHANES]    Frequency of Communication with Friends and Family: More than three times a week    Frequency of Social Gatherings with Friends and Family: Three times a week    Attends Religious Services: More than 4 times per year    Active Member of Clubs or Organizations: Yes    Attends Banker Meetings:  More than 4 times per year    Marital Status: Widowed     Family History: The patient's family history includes Breast cancer in her paternal aunt; Cancer in her mother; Heart attack (age of onset: 53) in her sister; Hypertension in her sister.  ROS:   Please see the history of present illness.     All other systems reviewed and are negative.  EKGs/Labs/Other Studies Reviewed:    The following studies were reviewed today:   EKG Interpretation Date/Time:  Tuesday November 23 2023 09:07:43 EST Ventricular Rate:  59 PR Interval:  158 QRS Duration:  84 QT Interval:  428 QTC Calculation: 423 R Axis:   -21  Text Interpretation: Sinus bradycardia Minimal voltage criteria for LVH, may be normal variant ( R in aVL ) Confirmed by Debbe Odea ([id number]) on 11/23/2023 9:17:09 AM    Recent Labs: 12/22/2022: Potassium 4.8; Sodium 134 11/18/2023: Hemoglobin 11.4; Platelets 269.0 11/22/2023: BUN 25; Creatinine, Ser 1.03  Recent Lipid Panel    Component Value Date/Time   CHOL 142 09/21/2023 0939   CHOL 183 12/19/2021 0814   TRIG 99.0 09/21/2023 0939   HDL 53.20 09/21/2023 0939   HDL 48 12/19/2021 0814   CHOLHDL 3 09/21/2023 0939   VLDL 19.8 09/21/2023 0939   LDLCALC 69 09/21/2023 0939   LDLCALC 96 12/19/2021 0814   LDLDIRECT 123.0 04/29/2021 0940     Risk Assessment/Calculations:          Physical Exam:    VS:  BP (!) 120/56 (BP Location: Left Arm, Patient Position: Sitting, Cuff Size:  Large)   Pulse (!) 59   Ht 4\' 11"  (1.499 m)   Wt 165 lb 9.6 oz (75.1 kg)   SpO2 96%   BMI 33.45 kg/m     Wt Readings from Last 3 Encounters:  11/23/23 165 lb 9.6 oz (75.1 kg)  11/22/23 164 lb 9.6 oz (74.7 kg)  11/16/23 169 lb 3.2 oz (76.7 kg)     GEN:  Well nourished, well developed in no acute distress HEENT: Normal NECK: No JVD; No carotid bruits CARDIAC: RRR, 2/6 systolic murmur RESPIRATORY:  Clear to auscultation without rales, wheezing or rhonchi  ABDOMEN: Soft, non-tender, non-distended MUSCULOSKELETAL: Trace edema; varicose veins SKIN: Warm and dry NEUROLOGIC:  Alert and oriented x 3 PSYCHIATRIC:  Normal affect   ASSESSMENT:    1. Primary hypertension   2. Mixed hyperlipidemia   3. PAD (peripheral artery disease) (HCC)    PLAN:    In order of problems listed above:  Hypertension, BP  controlled. Continue Coreg 12.5 mg twice daily, Norvasc 10 mg daily, losartan 100 mg daily.  History of hyponatremia with HCTZ.  Has a component of whitecoat syndrome occasionally. Hyperlipidemia, cholesterol controlled.  Continue Lipitor 40 mg daily, fenofibrate 145 mg daily. PAD, right SFA stenosis.  Aspirin 81 mg, Lipitor 40.  Follows up with vascular surgery.  Follow-up in 12 months.     Medication Adjustments/Labs and Tests Ordered: Current medicines are reviewed at length with the patient today.  Concerns regarding medicines are outlined above.  Orders Placed This Encounter  Procedures   EKG 12-Lead   No orders of the defined types were placed in this encounter.   Patient Instructions  Medication Instructions:   Your physician recommends that you continue on your current medications as directed. Please refer to the Current Medication list given to you today.  *If you need a refill on your cardiac medications before your next appointment, please call your pharmacy*  Lab Work:  None Ordered  If you have labs (blood work) drawn today and your tests are completely  normal, you will receive your results only by: MyChart Message (if you have MyChart) OR A paper copy in the mail If you have any lab test that is abnormal or we need to change your treatment, we will call you to review the results.   Testing/Procedures:  None Ordered   Follow-Up: At [facility], you and your health needs are our priority.  As part of our continuing mission to provide you with exceptional heart care, we have created designated Provider Care Teams.  These Care Teams include your primary Cardiologist (physician) and Advanced Practice Providers (APPs -  Physician Assistants and Nurse Practitioners) who all work together to provide you with the care you need, when you need it.  We recommend signing up for the patient portal called "MyChart".  Sign up information is provided on this After Visit Summary.  MyChart is used to connect with patients for Virtual Visits (Telemedicine).  Patients are able to view lab/test results, encounter notes, upcoming appointments, etc.  Non-urgent messages can be sent to your provider as well.   To learn more about what you can do with MyChart, go to [email or web address].    Your next appointment:   12 month(s)  Provider:   You may see Debbe Odea, MD or one of the following Advanced Practice Providers on your designated Care Team:   Nicolasa Ducking, NP Eula Listen, PA-C Cadence Fransico Michael, PA-C Charlsie Quest, NP Carlos Levering, NP   Signed, Debbe Odea, MD  11/23/2023 9:57 AM    [facility]

## 2023-12-03 ENCOUNTER — Ambulatory Visit: Payer: Self-pay | Admitting: Family

## 2023-12-03 ENCOUNTER — Emergency Department
Admission: EM | Admit: 2023-12-03 | Discharge: 2023-12-03 | Disposition: A | Discharge status: Home / Self Care | Payer: Medicare Other | Attending: Emergency Medicine | Admitting: Emergency Medicine

## 2023-12-03 ENCOUNTER — Other Ambulatory Visit: Payer: Self-pay

## 2023-12-03 ENCOUNTER — Emergency Department: Payer: Medicare Other

## 2023-12-03 DIAGNOSIS — M19012 Primary osteoarthritis, left shoulder: Secondary | ICD-10-CM | POA: Diagnosis not present

## 2023-12-03 DIAGNOSIS — I10 Essential (primary) hypertension: Secondary | ICD-10-CM | POA: Diagnosis not present

## 2023-12-03 DIAGNOSIS — M79622 Pain in left upper arm: Secondary | ICD-10-CM | POA: Diagnosis not present

## 2023-12-03 DIAGNOSIS — M19041 Primary osteoarthritis, right hand: Secondary | ICD-10-CM | POA: Diagnosis not present

## 2023-12-03 DIAGNOSIS — W108XXA Fall (on) (from) other stairs and steps, initial encounter: Secondary | ICD-10-CM | POA: Diagnosis not present

## 2023-12-03 DIAGNOSIS — M79641 Pain in right hand: Secondary | ICD-10-CM | POA: Diagnosis not present

## 2023-12-03 DIAGNOSIS — S199XXA Unspecified injury of neck, initial encounter: Secondary | ICD-10-CM | POA: Diagnosis not present

## 2023-12-03 DIAGNOSIS — Z8616 Personal history of COVID-19: Secondary | ICD-10-CM | POA: Insufficient documentation

## 2023-12-03 DIAGNOSIS — S0083XA Contusion of other part of head, initial encounter: Secondary | ICD-10-CM | POA: Insufficient documentation

## 2023-12-03 DIAGNOSIS — S0990XA Unspecified injury of head, initial encounter: Secondary | ICD-10-CM

## 2023-12-03 DIAGNOSIS — W19XXXA Unspecified fall, initial encounter: Secondary | ICD-10-CM

## 2023-12-03 DIAGNOSIS — Y9389 Activity, other specified: Secondary | ICD-10-CM | POA: Insufficient documentation

## 2023-12-03 DIAGNOSIS — S7002XA Contusion of left hip, initial encounter: Secondary | ICD-10-CM | POA: Insufficient documentation

## 2023-12-03 DIAGNOSIS — S40012A Contusion of left shoulder, initial encounter: Secondary | ICD-10-CM | POA: Diagnosis not present

## 2023-12-03 DIAGNOSIS — S0993XA Unspecified injury of face, initial encounter: Secondary | ICD-10-CM | POA: Diagnosis not present

## 2023-12-03 DIAGNOSIS — I6782 Cerebral ischemia: Secondary | ICD-10-CM | POA: Diagnosis not present

## 2023-12-03 DIAGNOSIS — M25552 Pain in left hip: Secondary | ICD-10-CM | POA: Diagnosis not present

## 2023-12-03 DIAGNOSIS — M160 Bilateral primary osteoarthritis of hip: Secondary | ICD-10-CM | POA: Diagnosis not present

## 2023-12-03 DIAGNOSIS — M25512 Pain in left shoulder: Secondary | ICD-10-CM | POA: Diagnosis not present

## 2023-12-03 MED ORDER — ACETAMINOPHEN 325 MG PO TABS
650.0000 mg | ORAL_TABLET | Freq: Once | ORAL | Status: AC
  Administered 2023-12-03: 650 mg via ORAL
  Filled 2023-12-03: qty 2

## 2023-12-03 NOTE — Telephone Encounter (Signed)
Chief Complaint: fall with injury  Symptoms: facial bruising, L black eye, left shoulder pain, left thigh pain and bruising  Frequency: last night around 8pm Pertinent Negatives: Patient denies fever, dizziness, syncope, nausea, vomiting, headache  Disposition: [x] ED /[] Urgent Care (no appt availability in office) / [] Appointment(In office/virtual)/ []  [place] [facility]/ [] Home Care/ [] Refused Recommended Disposition /[] [place] [facility]/ []  Follow-up with PCP Additional Notes: Pt states she had a fall while going up the stairs to her house last night around 8pm. Pt states her hands were full and she mis stepped. Pt states she hit her head, denies LOC, and landed on the left side of her body. Pt c/o left shoulder pain, facial pain and bruising, left thigh pain and bruising.  Pt requesting OV, states she would rather go to UC due to long wait times at ED. Called pt back and she is agreeable to go to ED.   Copied from CRM [id number]. Topic: Clinical - Red Word Triage >> Dec 03, 2023 11:01 AM Lennart Pall wrote: Red Word that prompted transfer to Nurse Triage: PT fell going into home, has black eye, bruised cheek bone, has a knot on her leg Reason for Disposition  [1] One or two "black eyes" (bruising, purple color of eyelids) AND [2] onset within 24 hours of head injury  Answer Assessment - Initial Assessment Questions 1. MECHANISM: "How did the fall happen?"     Pt states she was coming up the steps at her home yesterday evening. Pt states her hands were full with her bags, says she lost her balance going up the steps.  2. DOMESTIC VIOLENCE AND ELDER ABUSE SCREENING: "Did you fall because someone pushed you or tried to hurt you?" If Yes, ask: "Are you safe now?"     Pt denies any abuse.  3. ONSET: "When did the fall happen?" (e.g., minutes, hours, or days ago)     Last night  a little before 8pm.  4. LOCATION: "What part of the body hit the ground?" (e.g., back, buttocks, head,  hips, knees, hands, head, stomach)     Pt states she landed on her L cheek and broken her glasses, landed on left side of body.   5. INJURY: "Did you hurt (injure) yourself when you fell?" If Yes, ask: "What did you injure? Tell me more about this?" (e.g., body area; type of injury; pain severity)"      Left thigh is sore with bruising. Left shoulder is painful to touch. States her right knuckles have abrasions. Swollen and bruising to left side of face and has a black eye.  6. PAIN: "Is there any pain?" If Yes, ask: "How bad is the pain?" (e.g., Scale 1-10; or mild,  moderate, severe)   - NONE (0): No pain   - MILD (1-3): Doesn't interfere with normal activities    - MODERATE (4-7): Interferes with normal activities or awakens from sleep    - SEVERE (8-10): Excruciating pain, unable to do any normal activities      5-7/10.  7. SIZE: For cuts, bruises, or swelling, ask: "How large is it?" (e.g., inches or centimeters)      Right hand knuckles abrasions; swelling to face, left thigh; bruising face, left thigh.  8. OTHER SYMPTOMS: "Do you have any other symptoms?" (e.g., dizziness, fever, weakness; new onset or worsening).  Pt states she feels shaky; can open left eye some but states difficult due to swelling.   9.. CAUSE: "What do you think caused  the fall (or falling)?" (e.g., tripped, dizzy spell)       Pt states she thinks it was because she was tired from volunteering and then her arms were full so she would've caught herself before falling.  Protocols used: Falls and Falling-A-AH, Head Injury-A-AH

## 2023-12-03 NOTE — Telephone Encounter (Signed)
Agree with recommendation for ED  Please ensure goes to ed today Call patient and reiterate the importance of evaluation  She is not ED yet per epic

## 2023-12-03 NOTE — ED Provider Notes (Signed)
[facility] Provider Note    Event Date/Time   First MD Initiated Contact with Patient 12/03/23 1718     (approximate)   History   Fall   HPI  Kristen Powell is a 77 y.o. female who presents today for evaluation after a fall.  Patient reports that yesterday she was carrying her purse and several other things and lost her balance and fell backwards approximately 3 steps.  She reports that she hit her face on the banister and also hit her left shoulder and left leg on the railing.  She did not lose consciousness.  She was able to get up with assistance and has been ambulatory since the event.  She is on Plavix.  She denies weakness or paresthesias.  She has not had any vomiting.  No visual changes.  No neck pain.  Patient Active Problem List   Diagnosis Date Noted   Contact dermatitis due to plant 07/07/2023   Atherosclerosis of native arteries of extremity with intermittent claudication (HCC) 03/31/2023   COVID-19 03/03/2023   Chronic pain of both feet 09/18/2022   Hyponatremia 02/04/2022   Colon cancer screening    Anxiety 06/20/2021   B12 deficiency due to diet 04/29/2021   Screening for blood or protein in urine 04/29/2021   S/P partial hysterectomy- has ovaries.  04/29/2021   Chronic sciatica of right side 08/07/2019   Vitamin D deficiency 07/27/2016   Generalized anxiety disorder 08/13/2014   Degenerative arthritis of knee 05/14/2014   Essential hypertension 01/05/2014   HLD (hyperlipidemia) 01/05/2014   GERD (gastroesophageal reflux disease) 01/05/2014          Physical Exam   Triage Vital Signs: ED Triage Vitals  Encounter Vitals Group     BP 12/03/23 1421 (!) 166/59     Systolic BP Percentile --      Diastolic BP Percentile --      Pulse Rate 12/03/23 1421 62     Resp 12/03/23 1421 18     Temp 12/03/23 1421 98.2 F (36.8 C)     Temp Source 12/03/23 1421 Oral     SpO2 12/03/23 1421 98 %     Weight 12/03/23 1418 164 lb (74.4  kg)     Height 12/03/23 1418 4\' 11"  (1.499 m)     Head Circumference --      Peak Flow --      Pain Score 12/03/23 1418 7     Pain Loc --      Pain Education --      Exclude from Growth Chart --     Most recent vital signs: Vitals:   12/03/23 1421  BP: (!) 166/59  Pulse: 62  Resp: 18  Temp: 98.2 F (36.8 C)  SpO2: 98%    Physical Exam Vitals and nursing note reviewed.  Constitutional:      General: Awake and alert. No acute distress.    Appearance: Normal appearance. The patient is normal weight.  HENT:     Head: Normocephalic. Left periorbital ecchymosis. Normal EOMs. Normal lids and lashes. No hyphema    Mouth: Mucous membranes are moist.  Eyes:     General: PERRL. Normal EOMs        Right eye: No discharge.        Left eye: No discharge.     Conjunctiva/sclera: Conjunctivae normal.  Cardiovascular:     Rate and Rhythm: Normal rate and regular rhythm.     Pulses: Normal pulses.  Pulmonary:     Effort: Pulmonary effort is normal. No respiratory distress.     Breath sounds: Normal breath sounds.  Abdominal:     Abdomen is soft. There is no abdominal tenderness. No rebound or guarding. No distention. Musculoskeletal:        General: No swelling. Normal range of motion.     Cervical back: Normal range of motion and neck supple.  Pelvis stable. Full and normal ROM of bilateral hips. Able to flex/extend actively and passively against resistance. Skin:    General: Skin is warm and dry.     Capillary Refill: Capillary refill takes less than 2 seconds.     Findings: No rash.  Neurological:     Mental Status: The patient is awake and alert.      ED Results / Procedures / Treatments   Labs (all labs ordered are listed, but only abnormal results are displayed) Labs Reviewed - No data to display   EKG     RADIOLOGY I independently reviewed and interpreted imaging and agree with radiologists findings.     PROCEDURES:  Critical Care performed:    Procedures   MEDICATIONS ORDERED IN ED: Medications  acetaminophen (TYLENOL) tablet 650 mg (650 mg Oral Given 12/03/23 1731)     IMPRESSION / MDM / ASSESSMENT AND PLAN / ED COURSE  I reviewed the triage vital signs and the nursing notes.   Differential diagnosis includes, but is not limited to, contusion, fracture, intracranial hemorrhage, cervical spine injury.  Patient is awake and alert, hemodynamically stable and afebrile.  Patient has no focal neurological deficits.  He has full and normal range of motion of all of her extremities.  She is able to ambulate unassisted.  CT head and neck obtained in the triage per [place] criteria are negative for any acute findings.  CT face also obtained given her obvious trauma, and this was negative for any acute facial fractures.  She also had imaging of her left shoulder and humerus, right hand and left hip, but for her areas of discomfort, and these were negative for any acute findings as well.  Patient and her family are reassured by her results today.  She was treated symptomatically with Tylenol with good effect.  We discussed strict return precautions and importance of close outpatient follow-up.  Patient understands and agrees with plan.  She was discharged in stable condition.   Patient's presentation is most consistent with acute complicated illness / injury requiring diagnostic workup.      FINAL CLINICAL IMPRESSION(S) / ED DIAGNOSES   Final diagnoses:  Fall, initial encounter  Contusion of face, initial encounter  Contusion of left hip, initial encounter  Contusion of left shoulder, initial encounter  Injury of head, initial encounter     Rx / DC Orders   ED Discharge Orders     None        Note:  This document was prepared using Dragon voice recognition software and may include unintentional dictation errors.   Jackelyn Hoehn, PA-C 12/03/23 1805    Shaune Pollack, MD 12/04/23 [phone]

## 2023-12-03 NOTE — Telephone Encounter (Signed)
Pt is currently in the ED will check back with pt at later time.

## 2023-12-03 NOTE — ED Triage Notes (Signed)
Patient states last night she lost her balance and fell backwards down approximately 3 steps; no LOC. Patient complaining of bruising around left eye, left shoulder and pain to left leg. Patient ambulatory. Patient currently on Plavix.

## 2023-12-03 NOTE — Discharge Instructions (Signed)
Your CT scans and x-rays were negative for any acute injuries.  You may continue to take Tylenol per package instructions to help with your symptoms, as well as use cool compresses to the areas of pain.  Please return for any new, worsening, or change in symptoms or other concerns.  It was a pleasure caring for you today.

## 2023-12-03 NOTE — ED Provider Triage Note (Signed)
Emergency Medicine Provider Triage Evaluation Note  Kristen Powell , a 77 y.o. female  was evaluated in triage.  Pt complains of fall last night, on Plavix, hit head, fell from 3 steps and landed backwards hitting her head, complaining of left shoulder, left arm, right hand, left hip, and neck pain..  Review of Systems  Positive:  Negative:   Physical Exam  BP (!) 166/59   Pulse 62   Temp 98.2 F (36.8 C) (Oral)   Resp 18   Ht 4\' 11"  (1.499 m)   Wt 74.4 kg   SpO2 98%   BMI 33.12 kg/m  Gen:   Awake, no distress   Resp:  Normal effort  MSK:  Left shoulder and humerus tender, left hip tender, right hand tender, C-spine mildly tender, bruising noted on the face Other:    Medical Decision Making  Medically screening exam initiated at 2:25 PM.  Appropriate orders placed.  Kristen Powell was informed that the remainder of the evaluation will be completed by another provider, this initial triage assessment does not replace that evaluation, and the importance of remaining in the ED until their evaluation is complete.  Tdap is up-to-date   Kristen Ghee, PA-C 12/03/23 1426

## 2023-12-03 NOTE — ED Notes (Signed)
See triage notes. Patient lost her balance and fell backwards down about three steps. No LOC.

## 2023-12-06 NOTE — Telephone Encounter (Signed)
Spoke to pt she is home no broken bones no brain bleed. Pt stated she is feeling fine.

## 2023-12-21 ENCOUNTER — Other Ambulatory Visit (INDEPENDENT_AMBULATORY_CARE_PROVIDER_SITE_OTHER): Payer: Medicare Other

## 2023-12-21 DIAGNOSIS — D649 Anemia, unspecified: Secondary | ICD-10-CM

## 2023-12-21 LAB — URINALYSIS, ROUTINE W REFLEX MICROSCOPIC
Bilirubin Urine: NEGATIVE
Hgb urine dipstick: NEGATIVE
Ketones, ur: NEGATIVE
Nitrite: POSITIVE — AB
RBC / HPF: NONE SEEN (ref 0–?)
Specific Gravity, Urine: 1.015 (ref 1.000–1.030)
Total Protein, Urine: NEGATIVE
Urine Glucose: NEGATIVE
Urobilinogen, UA: 0.2 (ref 0.0–1.0)
pH: 6 (ref 5.0–8.0)

## 2023-12-21 LAB — B12 AND FOLATE PANEL
Folate: 8.9 ng/mL (ref >5.9)
Vitamin B-12: 561 pg/mL (ref 211–911)

## 2023-12-21 LAB — IBC + FERRITIN
Ferritin: 47.5 ng/mL (ref 10.0–291.0)
Iron: 104 ug/dL (ref 42–145)
Saturation Ratios: 23.6 % (ref 20.0–50.0)
TIBC: 441 ug/dL (ref 250.0–450.0)
Transferrin: 315 mg/dL (ref 212.0–360.0)

## 2023-12-22 ENCOUNTER — Telehealth: Payer: Self-pay | Admitting: Family

## 2023-12-22 ENCOUNTER — Ambulatory Visit: Payer: Medicare Other

## 2023-12-22 ENCOUNTER — Telehealth: Payer: Self-pay

## 2023-12-22 ENCOUNTER — Other Ambulatory Visit: Payer: Self-pay | Admitting: *Deleted

## 2023-12-22 DIAGNOSIS — R8271 Bacteriuria: Secondary | ICD-10-CM

## 2023-12-22 DIAGNOSIS — N3000 Acute cystitis without hematuria: Secondary | ICD-10-CM

## 2023-12-22 NOTE — Telephone Encounter (Signed)
 LVM to call back to check on pt and go over results with her also

## 2023-12-22 NOTE — Telephone Encounter (Signed)
 Copied from CRM [id number]. Topic: Clinical - Lab/Test Results >> Dec 22, 2023  1:24 PM Deaijah H wrote: Reason for CRM: Patient called requesting lab results advised all were normal except the urinalysis , she would like a callback with explanation / please call [phone]

## 2023-12-22 NOTE — Telephone Encounter (Signed)
 Can we add on urine culture from yesterday's urine?

## 2023-12-22 NOTE — Telephone Encounter (Signed)
 Urine culture ordered & add on sheet faxed

## 2023-12-23 MED ORDER — CEFDINIR 300 MG PO CAPS: 300.0000 mg | ORAL_CAPSULE | Freq: Two times a day (BID) | ORAL | 0 refills | Status: AC

## 2023-12-23 NOTE — Addendum Note (Signed)
 Addended by: Allegra Grana on: 12/23/2023 01:06 PM   Modules accepted: Orders

## 2023-12-23 NOTE — Telephone Encounter (Signed)
 Call patient I have sent in Omnicef to treat for infection ahead of urine culture.  I will call her if this needs to be changed.  Advise probiotics

## 2023-12-24 ENCOUNTER — Encounter (INDEPENDENT_AMBULATORY_CARE_PROVIDER_SITE_OTHER): Payer: Medicare Other

## 2023-12-24 ENCOUNTER — Ambulatory Visit (INDEPENDENT_AMBULATORY_CARE_PROVIDER_SITE_OTHER): Payer: Medicare Other | Admitting: Vascular Surgery

## 2023-12-24 NOTE — Telephone Encounter (Signed)
 See previous note Lvm for pt

## 2023-12-24 NOTE — Telephone Encounter (Signed)
 LVM to inform pt that Kristen Powell has sent in Kristen Powell to treat for infection ahead of urine culture.  I will call her if this needs to be changed.  Advise probiotics

## 2023-12-25 LAB — URINE CULTURE
MICRO NUMBER:: 15932642
SPECIMEN QUALITY:: ADEQUATE

## 2023-12-28 ENCOUNTER — Telehealth (INDEPENDENT_AMBULATORY_CARE_PROVIDER_SITE_OTHER): Payer: Self-pay

## 2023-12-28 ENCOUNTER — Ambulatory Visit (INDEPENDENT_AMBULATORY_CARE_PROVIDER_SITE_OTHER): Payer: Medicare Other

## 2023-12-28 ENCOUNTER — Ambulatory Visit (INDEPENDENT_AMBULATORY_CARE_PROVIDER_SITE_OTHER): Payer: Medicare Other | Admitting: Vascular Surgery

## 2023-12-28 ENCOUNTER — Encounter (INDEPENDENT_AMBULATORY_CARE_PROVIDER_SITE_OTHER): Payer: Self-pay | Admitting: Vascular Surgery

## 2023-12-28 VITALS — BP 148/69 | HR 62 | Resp 18 | Ht 59.0 in | Wt 164.0 lb

## 2023-12-28 DIAGNOSIS — I10 Essential (primary) hypertension: Secondary | ICD-10-CM | POA: Diagnosis not present

## 2023-12-28 DIAGNOSIS — E782 Mixed hyperlipidemia: Secondary | ICD-10-CM

## 2023-12-28 DIAGNOSIS — I70211 Atherosclerosis of native arteries of extremities with intermittent claudication, right leg: Secondary | ICD-10-CM | POA: Diagnosis not present

## 2023-12-28 NOTE — Assessment & Plan Note (Signed)
 Her ABI today on the right is significantly improved and now in the normal range at 1.07 with multiphasic waveforms.  Her left ABI remains in the normal range at 1.01. She has gotten an excellent result on her right leg after revascularization. Her ABI on that side was fairly normal as well.  She is adamant that her symptoms are very similar on the left leg as they were on the right before revascularization, and she is very interested in having an angiogram for further evaluation.  I do think this is reasonable given her good results on the right with a nearly normal ABI on the right as well.  I have discussed that it is possible we will not find significant disease to treat, and she voices her understanding about this.  We will plan left lower extremity angiogram with possible revascularization in the near future at her convenience.

## 2023-12-28 NOTE — Telephone Encounter (Signed)
 Pt is aware of abx and has received  advised to take probiotic while taking abx and has appt with Provider on 12/30/23

## 2023-12-28 NOTE — Telephone Encounter (Signed)
 Pt has been notified.

## 2023-12-28 NOTE — Telephone Encounter (Signed)
 I attempted to contact the patient to schedule her for a left leg angio with Dr. Wyn Quaker. A message was left for a return call.

## 2023-12-28 NOTE — Progress Notes (Signed)
 MRN : [id number]  Kristen Powell is a 78 y.o. (04/04/1946) female who presents with chief complaint of  Chief Complaint  Patient presents with   Follow-up    1 month fu + ABI  .  History of Present Illness: Patient returns today in follow up of her PAD. She is doing well from a PAD standpoint.  She underwent right lower extremity revascularization about a month ago and has had marked improvement in her right leg pain with activity.  She is very pleased with the results.  About 2 weeks ago, she had a fairly significant fall requiring an emergency room visit.  A head CT and neck CT were both negative for significant injuries.  She is still complaining of left leg pain with activity.  This is similar to the symptoms she was having on the right leg before revascularization.  Her ABI today on the right is significantly improved and now in the normal range at 1.07 with multiphasic waveforms.  Her left ABI remains in the normal range at 1.01.  Current Outpatient Medications  Medication Sig Dispense Refill   acetaminophen  (TYLENOL ) 500 MG tablet Take 1-2 tablets (500-1,000 mg total) by mouth every 4 (four) hours as needed for fever. 30 tablet 0   amLODipine  (NORVASC ) 10 MG tablet Take 1 tablet (10 mg total) by mouth daily. 90 tablet 3   aspirin  81 MG tablet Take 81 mg by mouth daily.     atorvastatin  (LIPITOR) 40 MG tablet Take 1 tablet (40 mg total) by mouth daily. 90 tablet 3   carvedilol  (COREG ) 12.5 MG tablet Take 1 tablet (12.5 mg total) by mouth 2 (two) times daily with a meal. 180 tablet 1   cefdinir  (OMNICEF ) 300 MG capsule Take 1 capsule (300 mg total) by mouth 2 (two) times daily for 7 days. 14 capsule 0   Cholecalciferol  (VITAMIN D ) 2000 UNITS CAPS Take by mouth daily. Patient is taking 3000 units instead of 2000     citalopram  (CELEXA ) 10 MG tablet Take 1 tablet (10 mg total) by mouth daily. 90 tablet 3   clopidogrel  (PLAVIX ) 75 MG tablet Take 1 tablet (75 mg total) by mouth daily. 30  tablet 11   fenofibrate  (TRICOR ) 145 MG tablet TAKE 1 TABLET BY MOUTH EVERY DAY 90 tablet 3   gabapentin  (NEURONTIN ) 100 MG capsule Take 1 capsule (100 mg total) by mouth at bedtime. 90 capsule 3   LORazepam  (ATIVAN ) 0.5 MG tablet Take 0.5-1 tablets (0.25-0.5 mg total) by mouth daily as needed for anxiety. 30 tablet 2   losartan  (COZAAR ) 100 MG tablet TAKE 1 TABLET (100 MG TOTAL) BY MOUTH DAILY. D/C 50 MG 90 tablet 3   Omega-3 Fatty Acids (FISH OIL) 1200 MG CAPS Take 1 capsule by mouth 2 (two) times daily.     omeprazole  (PRILOSEC) 20 MG capsule Take 1 capsule (20 mg total) by mouth daily.     vitamin B-12 (CYANOCOBALAMIN ) 1000 MCG tablet Take 1,000 mcg by mouth daily.     calcium  carbonate (OS-CAL) 600 MG TABS tablet Take 600 mg by mouth daily as needed. (Patient not taking: Reported on 11/22/2023)     No current facility-administered medications for this visit.    Past Medical History:  Diagnosis Date   Anxiety    COVID-19    Diastolic dysfunction    a. 12/2021 Echo: EF 60-65%, no rwma, GrI DD, nl RV fxn, RVSP 36.62mmHg, mild MR.   Hyperlipidemia    Hypertension  Mild Mitral regurgitation    a. 12/2021 Echo: Mild MR.   Psoriasis    younger age   Spinal stenosis    Traumatic rupture of left ear drum     Past Surgical History:  Procedure Laterality Date   ABDOMINAL HYSTERECTOMY  12/14/1978   Partial   BASAL CELL CARCINOMA EXCISION  2006,2008,2011   BUNIONECTOMY     BUNIONECTOMY Bilateral 12/14/1990   COLONOSCOPY WITH PROPOFOL  N/A 12/09/2021   Procedure: COLONOSCOPY WITH PROPOFOL ;  Surgeon: Jinny Carmine, MD;  Location: [facility];  Service: Endoscopy;  Laterality: N/A;   KNEE ARTHROSCOPY     KNEE CLOSED REDUCTION  05/07/2015   Procedure: CLOSED MANIPULATION KNEE;  Surgeon: Ozell Flake, MD;  Location: [facility];  Service: Orthopedics;;   KNEE SURGERY Bilateral 2005,2006   Repair   LOWER EXTREMITY ANGIOGRAPHY Right 11/22/2023   Procedure: Lower Extremity Angiography;   Surgeon: Marea Selinda RAMAN, MD;  Location: [facility];  Service: Cardiovascular;  Laterality: Right;   MOHS SURGERY     nose bcc, left cheeck Dr. Loris f/u Q 12/2020   PERIPHERAL VASCULAR CATHETERIZATION  11/22/2023   REPLACEMENT TOTAL KNEE Left 04/11/2015   manipulation--05/07/2015   ROTATOR CUFF REPAIR W/ DISTAL CLAVICLE EXCISION     SHOULDER SURGERY Right    x2   SKIN CANCER EXCISION     TOTAL KNEE ARTHROPLASTY     VAGINAL HYSTERECTOMY       Social History   Tobacco Use   Smoking status: Never   Smokeless tobacco: Never  Vaping Use   Vaping status: Never Used  Substance Use Topics   Alcohol use: No   Drug use: No      Family History  Problem Relation Age of Onset   Cancer Mother        lung   Hypertension Sister    Heart attack Sister 8   Breast cancer Paternal Aunt      Allergies  Allergen Reactions   Hydrochlorothiazide  Other (See Comments)    Hyponatremia    Adhesive [Tape] Rash    redness     REVIEW OF SYSTEMS (Negative unless checked)   Constitutional: [] Weight loss  [] Fever  [] Chills Cardiac: [] Chest pain   [] Chest pressure   [] Palpitations   [] Shortness of breath when laying flat   [] Shortness of breath at rest   [] Shortness of breath with exertion. Vascular:  [x] Pain in legs with walking   [] Pain in legs at rest   [] Pain in legs when laying flat   [x] Claudication   [] Pain in feet when walking  [] Pain in feet at rest  [] Pain in feet when laying flat   [] History of DVT   [] Phlebitis   [] Swelling in legs   [] Varicose veins   [] Non-healing ulcers Pulmonary:   [] Uses home oxygen   [] Productive cough   [] Hemoptysis   [] Wheeze  [] COPD   [] Asthma Neurologic:  [] Dizziness  [] Blackouts   [] Seizures   [] History of stroke   [] History of TIA  [] Aphasia   [] Temporary blindness   [] Dysphagia   [] Weakness or numbness in arms   [] Weakness or numbness in legs Musculoskeletal:  [x] Arthritis   [] Joint swelling   [x] Joint pain   [] Low back pain Hematologic:  [] Easy  bruising  [] Easy bleeding   [] Hypercoagulable state   [] Anemic   Gastrointestinal:  [] Blood in stool   [] Vomiting blood  [] Gastroesophageal reflux/heartburn   [] Abdominal pain Genitourinary:  [] Chronic kidney disease   [] Difficult urination  [] Frequent urination  [] Burning  with urination   [] Hematuria Skin:  [] Rashes   [] Ulcers   [] Wounds Psychological:  [] History of anxiety   []  History of major depression.  Physical Examination  BP (!) 148/69   Pulse 62   Resp 18   Ht 4' 11 (1.499 m)   Wt 164 lb (74.4 kg)   BMI 33.12 kg/m  Gen:  WD/WN, NAD Head: some facial bruising after her recent fall is covered with make up, No temporalis wasting. Ear/Nose/Throat: Hearing grossly intact, nares w/o erythema or drainage Eyes: Conjunctiva clear. Sclera non-icteric Neck: Supple.  Trachea midline Pulmonary:  Good air movement, no use of accessory muscles.  Cardiac: RRR, no JVD Vascular:  Vessel Right Left  Radial Palpable Palpable                          PT 1+ Palpable 1+ Palpable  DP 2+ Palpable 2+ Palpable   Gastrointestinal: soft, non-tender/non-distended. No guarding/reflex.  Musculoskeletal: M/S 5/5 throughout.  No deformity or atrophy. Trace LE edema. Neurologic: Sensation grossly intact in extremities.  Symmetrical.  Speech is fluent.  Psychiatric: Judgment intact, Mood & affect appropriate for pt's clinical situation. Dermatologic: No rashes or ulcers noted.  No cellulitis or open wounds.      Labs Recent Results (from the past 2160 hours)  Urinalysis, Routine w reflex microscopic     Status: Abnormal   Collection Time: 11/18/23 10:05 AM  Result Value Ref Range   Color, Urine YELLOW Yellow;Lt. Yellow;Straw;Dark Yellow;Amber;Green;Red;Brown   APPearance Sl Cloudy (A) Clear;Turbid;Slightly Cloudy;Cloudy   Specific Gravity, Urine 1.010 1.000 - 1.030   pH 7.0 5.0 - 8.0   Total Protein, Urine NEGATIVE Negative   Urine Glucose NEGATIVE Negative   Ketones, ur NEGATIVE  Negative   Bilirubin Urine NEGATIVE Negative   Hgb urine dipstick NEGATIVE Negative   Urobilinogen, UA 0.2 0.0 - 1.0   Leukocytes,Ua MODERATE (A) Negative   Nitrite POSITIVE (A) Negative   WBC, UA 7-10/hpf (A) 0-2/hpf   RBC / HPF 0-2/hpf 0-2/hpf   Squamous Epithelial / HPF Rare(0-4/hpf) Rare(0-4/hpf)   Bacteria, UA Many(>50/hpf) (A) None  CBC with Differential/Platelet     Status: Abnormal   Collection Time: 11/18/23 10:05 AM  Result Value Ref Range   WBC 6.3 4.0 - 10.5 K/uL   RBC 3.34 (L) 3.87 - 5.11 Mil/uL   Hemoglobin 11.4 (L) 12.0 - 15.0 g/dL   HCT 66.9 (L) 63.9 - 53.9 %   MCV 98.7 78.0 - 100.0 fl   MCHC 34.6 30.0 - 36.0 g/dL   RDW 86.4 88.4 - 84.4 %   Platelets 269.0 150.0 - 400.0 K/uL   Neutrophils Relative % 60.2 43.0 - 77.0 %   Lymphocytes Relative 25.4 12.0 - 46.0 %   Monocytes Relative 11.7 3.0 - 12.0 %   Eosinophils Relative 2.5 0.0 - 5.0 %   Basophils Relative 0.2 0.0 - 3.0 %   Neutro Abs 3.8 1.4 - 7.7 K/uL   Lymphs Abs 1.6 0.7 - 4.0 K/uL   Monocytes Absolute 0.7 0.1 - 1.0 K/uL   Eosinophils Absolute 0.2 0.0 - 0.7 K/uL   Basophils Absolute 0.0 0.0 - 0.1 K/uL  Urine Culture     Status: Abnormal   Collection Time: 11/19/23  2:58 PM   Specimen: Urine  Result Value Ref Range   MICRO NUMBER: [id number]    SPECIMEN QUALITY: Adequate    Sample Source URINE    STATUS: FINAL    ISOLATE 1:  Escherichia coli (A)     Comment: Greater than 100,000 CFU/mL of Escherichia coli      Susceptibility   Escherichia coli - URINE CULTURE, REFLEX    AMOX/CLAVULANIC 4 Sensitive     AMPICILLIN 4 Sensitive     AMPICILLIN/SULBACTAM <=2 Sensitive     CEFAZOLIN * <=4 Not Reportable      * For infections other than uncomplicated UTI caused by E. coli, K. pneumoniae or P. mirabilis: Cefazolin  is resistant if MIC > or = 8 mcg/mL. (Distinguishing susceptible versus intermediate for isolates with MIC < or = 4 mcg/mL requires additional testing.) For uncomplicated UTI caused by E. coli, K.  pneumoniae or P. mirabilis: Cefazolin  is susceptible if MIC <32 mcg/mL and predicts susceptible to the oral agents cefaclor, cefdinir , cefpodoxime, cefprozil, cefuroxime, cephalexin and loracarbef.     CEFTAZIDIME <=1 Sensitive     CEFEPIME <=1 Sensitive     CEFTRIAXONE <=1 Sensitive     CIPROFLOXACIN <=0.25 Sensitive     LEVOFLOXACIN <=0.12 Sensitive     GENTAMICIN  <=1 Sensitive     IMIPENEM <=0.25 Sensitive     NITROFURANTOIN  32 Sensitive     PIP/TAZO <=4 Sensitive     TOBRAMYCIN <=1 Sensitive     TRIMETH/SULFA* >=320 Resistant      * For infections other than uncomplicated UTI caused by E. coli, K. pneumoniae or P. mirabilis: Cefazolin  is resistant if MIC > or = 8 mcg/mL. (Distinguishing susceptible versus intermediate for isolates with MIC < or = 4 mcg/mL requires additional testing.) For uncomplicated UTI caused by E. coli, K. pneumoniae or P. mirabilis: Cefazolin  is susceptible if MIC <32 mcg/mL and predicts susceptible to the oral agents cefaclor, cefdinir , cefpodoxime, cefprozil, cefuroxime, cephalexin and loracarbef. Legend: S = Susceptible  I = Intermediate R = Resistant  NS = Not susceptible SDD = Susceptible Dose Dependent * = Not Tested  NR = Not Reported **NN = See Therapy Comments   BUN     Status: Abnormal   Collection Time: 11/22/23  9:56 AM  Result Value Ref Range   BUN 25 (H) 8 - 23 mg/dL    Comment: Performed at [facility], [address] [place], [place] [address]  Creatinine, serum     Status: Abnormal   Collection Time: 11/22/23  9:56 AM  Result Value Ref Range   Creatinine, Ser 1.03 (H) 0.44 - 1.00 mg/dL   GFR, Estimated 56 (L) >60 mL/min    Comment: (NOTE) Calculated using the CKD-EPI Creatinine Equation (2021) Performed at [facility], [address] [place], [place] [address]   Urinalysis, Routine w reflex microscopic     Status: Abnormal   Collection Time: 12/21/23 10:38 AM  Result Value Ref Range   Color,  Urine YELLOW Yellow;Lt. Yellow;Straw;Dark Yellow;Amber;Green;Red;Brown   APPearance Cloudy (A) Clear;Turbid;Slightly Cloudy;Cloudy   Specific Gravity, Urine 1.015 1.000 - 1.030   pH 6.0 5.0 - 8.0   Total Protein, Urine NEGATIVE Negative   Urine Glucose NEGATIVE Negative   Ketones, ur NEGATIVE Negative   Bilirubin Urine NEGATIVE Negative   Hgb urine dipstick NEGATIVE Negative   Urobilinogen, UA 0.2 0.0 - 1.0   Leukocytes,Ua SMALL (A) Negative   Nitrite POSITIVE (A) Negative   WBC, UA 21-50/hpf (A) 0-2/hpf   RBC / HPF none seen 0-2/hpf   Squamous Epithelial / HPF Rare(0-4/hpf) Rare(0-4/hpf)   Bacteria, UA Many(>50/hpf) (A) None  B12 and Folate Panel     Status: None   Collection Time: 12/21/23 10:38 AM  Result Value Ref Range   Vitamin B-12 561 211 - 911 pg/mL   Folate 8.9 >5.9 ng/mL  IBC + Ferritin     Status: None   Collection Time: 12/21/23 10:38 AM  Result Value Ref Range   Iron 104 42 - 145 ug/dL   Transferrin 684.9 787.9 - 360.0 mg/dL   Saturation Ratios 76.3 20.0 - 50.0 %   Ferritin 47.5 10.0 - 291.0 ng/mL   TIBC 441.0 250.0 - 450.0 mcg/dL  Urine Culture     Status: Abnormal   Collection Time: 12/22/23  3:32 PM   Specimen: Urine  Result Value Ref Range   MICRO NUMBER: [id number]    SPECIMEN QUALITY: Adequate    Sample Source URINE    STATUS: FINAL    ISOLATE 1: Escherichia coli (A)     Comment: Greater than 100,000 CFU/mL of Escherichia coli      Susceptibility   Escherichia coli - URINE CULTURE, REFLEX    AMOX/CLAVULANIC 16 Intermediate     AMPICILLIN >=32 Resistant     AMPICILLIN/SULBACTAM >=32 Resistant     CEFAZOLIN * 8 Resistant      * For uncomplicated UTI caused by E. coli, K. pneumoniae or P. mirabilis: Cefazolin  is susceptible if MIC <32 mcg/mL and predicts susceptible to the oral agents cefaclor, cefdinir , cefpodoxime, cefprozil, cefuroxime, cephalexin and loracarbef.     CEFTAZIDIME <=1 Sensitive     CEFEPIME <=1 Sensitive     CEFTRIAXONE <=1  Sensitive     CIPROFLOXACIN <=0.25 Sensitive     LEVOFLOXACIN <=0.12 Sensitive     GENTAMICIN  >=16 Resistant     IMIPENEM <=0.25 Sensitive     NITROFURANTOIN  <=16 Sensitive     PIP/TAZO 64 Intermediate     TOBRAMYCIN 4 Sensitive     TRIMETH/SULFA* >=320 Resistant      * For uncomplicated UTI caused by E. coli, K. pneumoniae or P. mirabilis: Cefazolin  is susceptible if MIC <32 mcg/mL and predicts susceptible to the oral agents cefaclor, cefdinir , cefpodoxime, cefprozil, cefuroxime, cephalexin and loracarbef. Legend: S = Susceptible  I = Intermediate R = Resistant  NS = Not susceptible SDD = Susceptible Dose Dependent * = Not Tested  NR = Not Reported **NN = See Therapy Comments     Radiology DG Hand Complete Right Result Date: 12/03/2023 CLINICAL DATA:  Pain after falling down steps. EXAM: RIGHT HAND - COMPLETE 3+ VIEW COMPARISON:  None Available. FINDINGS: Diffuse osseous demineralization. There is no evidence of acute fracture or dislocation. Joint space narrowing, marginal osteophytosis, and subchondral sclerosis of the triscaphe, first CMC, and first through fifth interphalangeal joints. Degenerative changes are most pronounced at the third and fourth PIP and DIP joints. There is mild radiocarpal joint space narrowing. Soft tissue swelling surrounding the third and fourth PIP joints. IMPRESSION: 1. No acute osseous abnormality. 2. Osteoarthritis of the hand, most pronounced at the triscaphe, first [facility], and third and fourth interphalangeal joints. Electronically Signed   By: Harrietta Sherry M.D.   On: 12/03/2023 16:43   DG Hip Unilat W or Wo Pelvis 2-3 Views Left Result Date: 12/03/2023 CLINICAL DATA:  Pain after falling down steps. EXAM: DG HIP (WITH OR WITHOUT PELVIS) 2-3V LEFT COMPARISON:  None Available. FINDINGS: There is no evidence of hip fracture or dislocation. The left femoral head is seated within the acetabulum. Mild degenerative changes of the bilateral hips. The  sacroiliac joints and pubic symphysis are anatomically aligned. Vascular calcifications are present. IMPRESSION: No acute osseous abnormality. Electronically Signed  By: Harrietta Sherry M.D.   On: 12/03/2023 16:38   DG Shoulder Left Result Date: 12/03/2023 CLINICAL DATA:  Pain after falling down steps. EXAM: LEFT SHOULDER - 2+ VIEW COMPARISON:  None Available. FINDINGS: There is no evidence of acute fracture or dislocation. The glenohumeral joint is anatomically aligned with joint space narrowing and marginal osteophytes. The acromioclavicular joint is anatomically aligned with mild degenerative changes. Soft tissues are unremarkable. IMPRESSION: 1. No acute fracture or dislocation. 2. 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 Electronically Signed   By: Harrietta Sherry M.D.   On: 12/03/2023 16:36   DG Humerus Left Result Date: 12/03/2023 CLINICAL DATA:  Pain after falling down steps. EXAM: LEFT HUMERUS - 2+ VIEW COMPARISON:  None Available. FINDINGS: There is no evidence of acute fracture or other focal bone lesions. Soft tissues are unremarkable. IMPRESSION: No acute osseous abnormality. Electronically Signed   By: Harrietta Sherry M.D.   On: 12/03/2023 16:34   CT HEAD WO CONTRAST ( ) Result Date: 12/03/2023 CLINICAL DATA:  Head trauma, minor (Age >= 65y); Facial trauma, blunt; Neck trauma (Age >= 65y) EXAM: CT HEAD WITHOUT CONTRAST CT MAXILLOFACIAL WITHOUT CONTRAST CT CERVICAL SPINE WITHOUT CONTRAST TECHNIQUE: Multidetector CT imaging of the head, cervical spine, and maxillofacial structures were performed using the standard protocol without intravenous contrast. Multiplanar CT image reconstructions of the cervical spine and maxillofacial structures were also generated. RADIATION DOSE REDUCTION: This exam was performed according to the departmental dose-optimization program which includes automated exposure control, adjustment of the mA and/or kV according to patient size  and/or use of iterative reconstruction technique. COMPARISON:  None Available. FINDINGS: CT HEAD FINDINGS Brain: No evidence of acute infarction, hemorrhage, hydrocephalus, extra-axial collection or mass lesion/mass effect. Background of mild-to-moderate chronic microvascular ischemic change. Vascular: No hyperdense vessel or unexpected calcification. Skull: Normal. Negative for fracture or focal lesion. Other: None. CT MAXILLOFACIAL FINDINGS Osseous: No fracture or mandibular dislocation. No destructive process. Orbits: Negative. No traumatic or inflammatory finding. Sinuses: No middle ear or mastoid effusion. Bilateral maxillary sinuses. Soft tissues: Soft tissue swelling in the infraorbital soft tissues on the left CT CERVICAL SPINE FINDINGS Alignment: Grade 1 anterolisthesis of C2 on C3 and C3 on C4. Grade 1 anterolisthesis of C6 on C7. Skull base and vertebrae: No acute fracture. No primary bone lesion or focal pathologic process. Soft tissues and spinal canal: No prevertebral fluid or swelling. No visible canal hematoma. Disc levels:  No CT evidence of high-grade spinal canal stenosis. Upper chest: Negative. Other: None IMPRESSION: 1. No acute intracranial abnormality. 2. No acute facial bone fracture. 3. No acute fracture or traumatic subluxation of the cervical spine. 4. Soft tissue swelling in the infraorbital soft tissues on the left. Electronically Signed   By: Lyndall Gore M.D.   On: 12/03/2023 15:17   CT Cervical Spine Wo Contrast Result Date: 12/03/2023 CLINICAL DATA:  Head trauma, minor (Age >= 65y); Facial trauma, blunt; Neck trauma (Age >= 65y) EXAM: CT HEAD WITHOUT CONTRAST CT MAXILLOFACIAL WITHOUT CONTRAST CT CERVICAL SPINE WITHOUT CONTRAST TECHNIQUE: Multidetector CT imaging of the head, cervical spine, and maxillofacial structures were performed using the standard protocol without intravenous contrast. Multiplanar CT image reconstructions of the cervical spine and maxillofacial structures  were also generated. RADIATION DOSE REDUCTION: This exam was performed according to the departmental dose-optimization program which includes automated exposure control, adjustment of the mA and/or kV according to patient size and/or use of iterative reconstruction technique. COMPARISON:  None Available. FINDINGS: CT HEAD FINDINGS Brain:  No evidence of acute infarction, hemorrhage, hydrocephalus, extra-axial collection or mass lesion/mass effect. Background of mild-to-moderate chronic microvascular ischemic change. Vascular: No hyperdense vessel or unexpected calcification. Skull: Normal. Negative for fracture or focal lesion. Other: None. CT MAXILLOFACIAL FINDINGS Osseous: No fracture or mandibular dislocation. No destructive process. Orbits: Negative. No traumatic or inflammatory finding. Sinuses: No middle ear or mastoid effusion. Bilateral maxillary sinuses. Soft tissues: Soft tissue swelling in the infraorbital soft tissues on the left CT CERVICAL SPINE FINDINGS Alignment: Grade 1 anterolisthesis of C2 on C3 and C3 on C4. Grade 1 anterolisthesis of C6 on C7. Skull base and vertebrae: No acute fracture. No primary bone lesion or focal pathologic process. Soft tissues and spinal canal: No prevertebral fluid or swelling. No visible canal hematoma. Disc levels:  No CT evidence of high-grade spinal canal stenosis. Upper chest: Negative. Other: None IMPRESSION: 1. No acute intracranial abnormality. 2. No acute facial bone fracture. 3. No acute fracture or traumatic subluxation of the cervical spine. 4. Soft tissue swelling in the infraorbital soft tissues on the left. Electronically Signed   By: Lyndall Gore M.D.   On: 12/03/2023 15:17   CT Maxillofacial Wo Contrast Result Date: 12/03/2023 CLINICAL DATA:  Head trauma, minor (Age >= 65y); Facial trauma, blunt; Neck trauma (Age >= 65y) EXAM: CT HEAD WITHOUT CONTRAST CT MAXILLOFACIAL WITHOUT CONTRAST CT CERVICAL SPINE WITHOUT CONTRAST TECHNIQUE: Multidetector CT  imaging of the head, cervical spine, and maxillofacial structures were performed using the standard protocol without intravenous contrast. Multiplanar CT image reconstructions of the cervical spine and maxillofacial structures were also generated. RADIATION DOSE REDUCTION: This exam was performed according to the departmental dose-optimization program which includes automated exposure control, adjustment of the mA and/or kV according to patient size and/or use of iterative reconstruction technique. COMPARISON:  None Available. FINDINGS: CT HEAD FINDINGS Brain: No evidence of acute infarction, hemorrhage, hydrocephalus, extra-axial collection or mass lesion/mass effect. Background of mild-to-moderate chronic microvascular ischemic change. Vascular: No hyperdense vessel or unexpected calcification. Skull: Normal. Negative for fracture or focal lesion. Other: None. CT MAXILLOFACIAL FINDINGS Osseous: No fracture or mandibular dislocation. No destructive process. Orbits: Negative. No traumatic or inflammatory finding. Sinuses: No middle ear or mastoid effusion. Bilateral maxillary sinuses. Soft tissues: Soft tissue swelling in the infraorbital soft tissues on the left CT CERVICAL SPINE FINDINGS Alignment: Grade 1 anterolisthesis of C2 on C3 and C3 on C4. Grade 1 anterolisthesis of C6 on C7. Skull base and vertebrae: No acute fracture. No primary bone lesion or focal pathologic process. Soft tissues and spinal canal: No prevertebral fluid or swelling. No visible canal hematoma. Disc levels:  No CT evidence of high-grade spinal canal stenosis. Upper chest: Negative. Other: None IMPRESSION: 1. No acute intracranial abnormality. 2. No acute facial bone fracture. 3. No acute fracture or traumatic subluxation of the cervical spine. 4. Soft tissue swelling in the infraorbital soft tissues on the left. Electronically Signed   By: Lyndall Gore M.D.   On: 12/03/2023 15:17    Assessment/Plan  Atherosclerosis of native  arteries of extremity with intermittent claudication (HCC) Her ABI today on the right is significantly improved and now in the normal range at 1.07 with multiphasic waveforms.  Her left ABI remains in the normal range at 1.01. She has gotten an excellent result on her right leg after revascularization. Her ABI on that side was fairly normal as well.  She is adamant that her symptoms are very similar on the left leg as they were on the right  before revascularization, and she is very interested in having an angiogram for further evaluation.  I do think this is reasonable given her good results on the right with a nearly normal ABI on the right as well.  I have discussed that it is possible we will not find significant disease to treat, and she voices her understanding about this.  We will plan left lower extremity angiogram with possible revascularization in the near future at her convenience.   Essential hypertension blood pressure control important in reducing the progression of atherosclerotic disease. On appropriate oral medications.     HLD (hyperlipidemia) lipid control important in reducing the progression of atherosclerotic disease. On Fish oil and Tricor   Selinda Gu, MD  12/28/2023 5:28 PM    This note was created with Dragon medical transcription system.  Any errors from dictation are purely unintentional

## 2023-12-28 NOTE — H&P (View-Only) (Signed)
 MRN : [id number]  Kristen Powell is a 78 y.o. (1946-02-07) female who presents with chief complaint of  Chief Complaint  Patient presents with   Follow-up    1 month fu + ABI  .  History of Present Illness: Patient returns today in follow up of her PAD. She is doing well from a PAD standpoint.  She underwent right lower extremity revascularization about a month ago and has had marked improvement in her right leg pain with activity.  She is very pleased with the results.  About 2 weeks ago, she had a fairly significant fall requiring an emergency room visit.  A head CT and neck CT were both negative for significant injuries.  She is still complaining of left leg pain with activity.  This is similar to the symptoms she was having on the right leg before revascularization.  Her ABI today on the right is significantly improved and now in the normal range at 1.07 with multiphasic waveforms.  Her left ABI remains in the normal range at 1.01.  Current Outpatient Medications  Medication Sig Dispense Refill   acetaminophen (TYLENOL) 500 MG tablet Take 1-2 tablets (500-1,000 mg total) by mouth every 4 (four) hours as needed for fever. 30 tablet 0   amLODipine (NORVASC) 10 MG tablet Take 1 tablet (10 mg total) by mouth daily. 90 tablet 3   aspirin 81 MG tablet Take 81 mg by mouth daily.     atorvastatin (LIPITOR) 40 MG tablet Take 1 tablet (40 mg total) by mouth daily. 90 tablet 3   carvedilol (COREG) 12.5 MG tablet Take 1 tablet (12.5 mg total) by mouth 2 (two) times daily with a meal. 180 tablet 1   cefdinir (OMNICEF) 300 MG capsule Take 1 capsule (300 mg total) by mouth 2 (two) times daily for 7 days. 14 capsule 0   Cholecalciferol (VITAMIN D) 2000 UNITS CAPS Take by mouth daily. Patient is taking 3000 units instead of 2000     citalopram (CELEXA) 10 MG tablet Take 1 tablet (10 mg total) by mouth daily. 90 tablet 3   clopidogrel (PLAVIX) 75 MG tablet Take 1 tablet (75 mg total) by mouth daily. 30  tablet 11   fenofibrate (TRICOR) 145 MG tablet TAKE 1 TABLET BY MOUTH EVERY DAY 90 tablet 3   gabapentin (NEURONTIN) 100 MG capsule Take 1 capsule (100 mg total) by mouth at bedtime. 90 capsule 3   LORazepam (ATIVAN) 0.5 MG tablet Take 0.5-1 tablets (0.25-0.5 mg total) by mouth daily as needed for anxiety. 30 tablet 2   losartan (COZAAR) 100 MG tablet TAKE 1 TABLET (100 MG TOTAL) BY MOUTH DAILY. D/C 50 MG 90 tablet 3   Omega-3 Fatty Acids (FISH OIL) 1200 MG CAPS Take 1 capsule by mouth 2 (two) times daily.     omeprazole (PRILOSEC) 20 MG capsule Take 1 capsule (20 mg total) by mouth daily.     vitamin B-12 (CYANOCOBALAMIN) 1000 MCG tablet Take 1,000 mcg by mouth daily.     calcium carbonate (OS-CAL) 600 MG TABS tablet Take 600 mg by mouth daily as needed. (Patient not taking: Reported on 11/22/2023)     No current facility-administered medications for this visit.    Past Medical History:  Diagnosis Date   Anxiety    COVID-19    Diastolic dysfunction    a. 12/2021 Echo: EF 60-65%, no rwma, GrI DD, nl RV fxn, RVSP 36.69mmHg, mild MR.   Hyperlipidemia    Hypertension  Mild Mitral regurgitation    a. 12/2021 Echo: Mild MR.   Psoriasis    younger age   Spinal stenosis    Traumatic rupture of left ear drum     Past Surgical History:  Procedure Laterality Date   ABDOMINAL HYSTERECTOMY  12/14/1978   Partial   BASAL CELL CARCINOMA EXCISION  2006,2008,2011   BUNIONECTOMY     BUNIONECTOMY Bilateral 12/14/1990   COLONOSCOPY WITH PROPOFOL  N/A 12/09/2021   Procedure: COLONOSCOPY WITH PROPOFOL ;  Surgeon: Jinny Carmine, MD;  Location: [facility];  Service: Endoscopy;  Laterality: N/A;   KNEE ARTHROSCOPY     KNEE CLOSED REDUCTION  05/07/2015   Procedure: CLOSED MANIPULATION KNEE;  Surgeon: Ozell Flake, MD;  Location: [facility];  Service: Orthopedics;;   KNEE SURGERY Bilateral 2005,2006   Repair   LOWER EXTREMITY ANGIOGRAPHY Right 11/22/2023   Procedure: Lower Extremity Angiography;   Surgeon: Marea Selinda RAMAN, MD;  Location: [facility];  Service: Cardiovascular;  Laterality: Right;   MOHS SURGERY     nose bcc, left cheeck Dr. Loris f/u Q 12/2020   PERIPHERAL VASCULAR CATHETERIZATION  11/22/2023   REPLACEMENT TOTAL KNEE Left 04/11/2015   manipulation--05/07/2015   ROTATOR CUFF REPAIR W/ DISTAL CLAVICLE EXCISION     SHOULDER SURGERY Right    x2   SKIN CANCER EXCISION     TOTAL KNEE ARTHROPLASTY     VAGINAL HYSTERECTOMY       Social History   Tobacco Use   Smoking status: Never   Smokeless tobacco: Never  Vaping Use   Vaping status: Never Used  Substance Use Topics   Alcohol use: No   Drug use: No      Family History  Problem Relation Age of Onset   Cancer Mother        lung   Hypertension Sister    Heart attack Sister 8   Breast cancer Paternal Aunt      Allergies  Allergen Reactions   Hydrochlorothiazide  Other (See Comments)    Hyponatremia    Adhesive [Tape] Rash    redness     REVIEW OF SYSTEMS (Negative unless checked)   Constitutional: [] Weight loss  [] Fever  [] Chills Cardiac: [] Chest pain   [] Chest pressure   [] Palpitations   [] Shortness of breath when laying flat   [] Shortness of breath at rest   [] Shortness of breath with exertion. Vascular:  [x] Pain in legs with walking   [] Pain in legs at rest   [] Pain in legs when laying flat   [x] Claudication   [] Pain in feet when walking  [] Pain in feet at rest  [] Pain in feet when laying flat   [] History of DVT   [] Phlebitis   [] Swelling in legs   [] Varicose veins   [] Non-healing ulcers Pulmonary:   [] Uses home oxygen   [] Productive cough   [] Hemoptysis   [] Wheeze  [] COPD   [] Asthma Neurologic:  [] Dizziness  [] Blackouts   [] Seizures   [] History of stroke   [] History of TIA  [] Aphasia   [] Temporary blindness   [] Dysphagia   [] Weakness or numbness in arms   [] Weakness or numbness in legs Musculoskeletal:  [x] Arthritis   [] Joint swelling   [x] Joint pain   [] Low back pain Hematologic:  [] Easy  bruising  [] Easy bleeding   [] Hypercoagulable state   [] Anemic   Gastrointestinal:  [] Blood in stool   [] Vomiting blood  [] Gastroesophageal reflux/heartburn   [] Abdominal pain Genitourinary:  [] Chronic kidney disease   [] Difficult urination  [] Frequent urination  [] Burning  with urination   [] Hematuria Skin:  [] Rashes   [] Ulcers   [] Wounds Psychological:  [] History of anxiety   []  History of major depression.  Physical Examination  BP (!) 148/69   Pulse 62   Resp 18   Ht 4\' 11"  (1.499 m)   Wt 164 lb (74.4 kg)   BMI 33.12 kg/m  Gen:  WD/WN, NAD Head: some facial bruising after her recent fall is covered with make up, No temporalis wasting. Ear/Nose/Throat: Hearing grossly intact, nares w/o erythema or drainage Eyes: Conjunctiva clear. Sclera non-icteric Neck: Supple.  Trachea midline Pulmonary:  Good air movement, no use of accessory muscles.  Cardiac: RRR, no JVD Vascular:  Vessel Right Left  Radial Palpable Palpable                          PT 1+ Palpable 1+ Palpable  DP 2+ Palpable 2+ Palpable   Gastrointestinal: soft, non-tender/non-distended. No guarding/reflex.  Musculoskeletal: M/S 5/5 throughout.  No deformity or atrophy. Trace LE edema. Neurologic: Sensation grossly intact in extremities.  Symmetrical.  Speech is fluent.  Psychiatric: Judgment intact, Mood & affect appropriate for pt's clinical situation. Dermatologic: No rashes or ulcers noted.  No cellulitis or open wounds.      Labs Recent Results (from the past 2160 hours)  Urinalysis, Routine w reflex microscopic     Status: Abnormal   Collection Time: 11/18/23 10:05 AM  Result Value Ref Range   Color, Urine YELLOW Yellow;Lt. Yellow;Straw;Dark Yellow;Amber;Green;Red;Brown   APPearance Sl Cloudy (A) Clear;Turbid;Slightly Cloudy;Cloudy   Specific Gravity, Urine 1.010 1.000 - 1.030   pH 7.0 5.0 - 8.0   Total Protein, Urine NEGATIVE Negative   Urine Glucose NEGATIVE Negative   Ketones, ur NEGATIVE  Negative   Bilirubin Urine NEGATIVE Negative   Hgb urine dipstick NEGATIVE Negative   Urobilinogen, UA 0.2 0.0 - 1.0   Leukocytes,Ua MODERATE (A) Negative   Nitrite POSITIVE (A) Negative   WBC, UA 7-10/hpf (A) 0-2/hpf   RBC / HPF 0-2/hpf 0-2/hpf   Squamous Epithelial / HPF Rare(0-4/hpf) Rare(0-4/hpf)   Bacteria, UA Many(>50/hpf) (A) None  CBC with Differential/Platelet     Status: Abnormal   Collection Time: 11/18/23 10:05 AM  Result Value Ref Range   WBC 6.3 4.0 - 10.5 K/uL   RBC 3.34 (L) 3.87 - 5.11 Mil/uL   Hemoglobin 11.4 (L) 12.0 - 15.0 g/dL   HCT 86.5 (L) 78.4 - 69.6 %   MCV 98.7 78.0 - 100.0 fl   MCHC 34.6 30.0 - 36.0 g/dL   RDW 29.5 28.4 - 13.2 %   Platelets 269.0 150.0 - 400.0 K/uL   Neutrophils Relative % 60.2 43.0 - 77.0 %   Lymphocytes Relative 25.4 12.0 - 46.0 %   Monocytes Relative 11.7 3.0 - 12.0 %   Eosinophils Relative 2.5 0.0 - 5.0 %   Basophils Relative 0.2 0.0 - 3.0 %   Neutro Abs 3.8 1.4 - 7.7 K/uL   Lymphs Abs 1.6 0.7 - 4.0 K/uL   Monocytes Absolute 0.7 0.1 - 1.0 K/uL   Eosinophils Absolute 0.2 0.0 - 0.7 K/uL   Basophils Absolute 0.0 0.0 - 0.1 K/uL  Urine Culture     Status: Abnormal   Collection Time: 11/19/23  2:58 PM   Specimen: Urine  Result Value Ref Range   MICRO NUMBER: [id number]    SPECIMEN QUALITY: Adequate    Sample Source URINE    STATUS: FINAL    ISOLATE 1:  Escherichia coli (A)     Comment: Greater than 100,000 CFU/mL of Escherichia coli      Susceptibility   Escherichia coli - URINE CULTURE, REFLEX    AMOX/CLAVULANIC 4 Sensitive     AMPICILLIN 4 Sensitive     AMPICILLIN/SULBACTAM <=2 Sensitive     CEFAZOLIN * <=4 Not Reportable      * For infections other than uncomplicated UTI caused by E. coli, K. pneumoniae or P. mirabilis: Cefazolin  is resistant if MIC > or = 8 mcg/mL. (Distinguishing susceptible versus intermediate for isolates with MIC < or = 4 mcg/mL requires additional testing.) For uncomplicated UTI caused by E. coli, K.  pneumoniae or P. mirabilis: Cefazolin  is susceptible if MIC <32 mcg/mL and predicts susceptible to the oral agents cefaclor, cefdinir , cefpodoxime, cefprozil, cefuroxime, cephalexin and loracarbef.     CEFTAZIDIME <=1 Sensitive     CEFEPIME <=1 Sensitive     CEFTRIAXONE <=1 Sensitive     CIPROFLOXACIN <=0.25 Sensitive     LEVOFLOXACIN <=0.12 Sensitive     GENTAMICIN  <=1 Sensitive     IMIPENEM <=0.25 Sensitive     NITROFURANTOIN  32 Sensitive     PIP/TAZO <=4 Sensitive     TOBRAMYCIN <=1 Sensitive     TRIMETH/SULFA* >=320 Resistant      * For infections other than uncomplicated UTI caused by E. coli, K. pneumoniae or P. mirabilis: Cefazolin  is resistant if MIC > or = 8 mcg/mL. (Distinguishing susceptible versus intermediate for isolates with MIC < or = 4 mcg/mL requires additional testing.) For uncomplicated UTI caused by E. coli, K. pneumoniae or P. mirabilis: Cefazolin  is susceptible if MIC <32 mcg/mL and predicts susceptible to the oral agents cefaclor, cefdinir , cefpodoxime, cefprozil, cefuroxime, cephalexin and loracarbef. Legend: S = Susceptible  I = Intermediate R = Resistant  NS = Not susceptible SDD = Susceptible Dose Dependent * = Not Tested  NR = Not Reported **NN = See Therapy Comments   BUN     Status: Abnormal   Collection Time: 11/22/23  9:56 AM  Result Value Ref Range   BUN 25 (H) 8 - 23 mg/dL    Comment: Performed at [facility], [address] [place], [place] [address]  Creatinine, serum     Status: Abnormal   Collection Time: 11/22/23  9:56 AM  Result Value Ref Range   Creatinine, Ser 1.03 (H) 0.44 - 1.00 mg/dL   GFR, Estimated 56 (L) >60 mL/min    Comment: (NOTE) Calculated using the CKD-EPI Creatinine Equation (2021) Performed at [facility], [address] [place], [place] [address]   Urinalysis, Routine w reflex microscopic     Status: Abnormal   Collection Time: 12/21/23 10:38 AM  Result Value Ref Range   Color,  Urine YELLOW Yellow;Lt. Yellow;Straw;Dark Yellow;Amber;Green;Red;Brown   APPearance Cloudy (A) Clear;Turbid;Slightly Cloudy;Cloudy   Specific Gravity, Urine 1.015 1.000 - 1.030   pH 6.0 5.0 - 8.0   Total Protein, Urine NEGATIVE Negative   Urine Glucose NEGATIVE Negative   Ketones, ur NEGATIVE Negative   Bilirubin Urine NEGATIVE Negative   Hgb urine dipstick NEGATIVE Negative   Urobilinogen, UA 0.2 0.0 - 1.0   Leukocytes,Ua SMALL (A) Negative   Nitrite POSITIVE (A) Negative   WBC, UA 21-50/hpf (A) 0-2/hpf   RBC / HPF none seen 0-2/hpf   Squamous Epithelial / HPF Rare(0-4/hpf) Rare(0-4/hpf)   Bacteria, UA Many(>50/hpf) (A) None  B12 and Folate Panel     Status: None   Collection Time: 12/21/23 10:38 AM  Result Value Ref Range   Vitamin B-12 561 211 - 911 pg/mL   Folate 8.9 >5.9 ng/mL  IBC + Ferritin     Status: None   Collection Time: 12/21/23 10:38 AM  Result Value Ref Range   Iron 104 42 - 145 ug/dL   Transferrin 098.1 191.4 - 360.0 mg/dL   Saturation Ratios 78.2 20.0 - 50.0 %   Ferritin 47.5 10.0 - 291.0 ng/mL   TIBC 441.0 250.0 - 450.0 mcg/dL  Urine Culture     Status: Abnormal   Collection Time: 12/22/23  3:32 PM   Specimen: Urine  Result Value Ref Range   MICRO NUMBER: [id number]    SPECIMEN QUALITY: Adequate    Sample Source URINE    STATUS: FINAL    ISOLATE 1: Escherichia coli (A)     Comment: Greater than 100,000 CFU/mL of Escherichia coli      Susceptibility   Escherichia coli - URINE CULTURE, REFLEX    AMOX/CLAVULANIC 16 Intermediate     AMPICILLIN >=32 Resistant     AMPICILLIN/SULBACTAM >=32 Resistant     CEFAZOLIN* 8 Resistant      * For uncomplicated UTI caused by E. coli, K. pneumoniae or P. mirabilis: Cefazolin is susceptible if MIC <32 mcg/mL and predicts susceptible to the oral agents cefaclor, cefdinir, cefpodoxime, cefprozil, cefuroxime, cephalexin and loracarbef.     CEFTAZIDIME <=1 Sensitive     CEFEPIME <=1 Sensitive     CEFTRIAXONE <=1  Sensitive     CIPROFLOXACIN <=0.25 Sensitive     LEVOFLOXACIN <=0.12 Sensitive     GENTAMICIN >=16 Resistant     IMIPENEM <=0.25 Sensitive     NITROFURANTOIN <=16 Sensitive     PIP/TAZO 64 Intermediate     TOBRAMYCIN 4 Sensitive     TRIMETH/SULFA* >=320 Resistant      * For uncomplicated UTI caused by E. coli, K. pneumoniae or P. mirabilis: Cefazolin is susceptible if MIC <32 mcg/mL and predicts susceptible to the oral agents cefaclor, cefdinir, cefpodoxime, cefprozil, cefuroxime, cephalexin and loracarbef. Legend: S = Susceptible  I = Intermediate R = Resistant  NS = Not susceptible SDD = Susceptible Dose Dependent * = Not Tested  NR = Not Reported **NN = See Therapy Comments     Radiology DG Hand Complete Right Result Date: 12/03/2023 CLINICAL DATA:  Pain after falling down steps. EXAM: RIGHT HAND - COMPLETE 3+ VIEW COMPARISON:  None Available. FINDINGS: Diffuse osseous demineralization. There is no evidence of acute fracture or dislocation. Joint space narrowing, marginal osteophytosis, and subchondral sclerosis of the triscaphe, first CMC, and first through fifth interphalangeal joints. Degenerative changes are most pronounced at the third and fourth PIP and DIP joints. There is mild radiocarpal joint space narrowing. Soft tissue swelling surrounding the third and fourth PIP joints. IMPRESSION: 1. No acute osseous abnormality. 2. Osteoarthritis of the hand, most pronounced at the triscaphe, first [facility], and third and fourth interphalangeal joints. Electronically Signed   By: Hart Robinsons M.D.   On: 12/03/2023 16:43   DG Hip Unilat W or Wo Pelvis 2-3 Views Left Result Date: 12/03/2023 CLINICAL DATA:  Pain after falling down steps. EXAM: DG HIP (WITH OR WITHOUT PELVIS) 2-3V LEFT COMPARISON:  None Available. FINDINGS: There is no evidence of hip fracture or dislocation. The left femoral head is seated within the acetabulum. Mild degenerative changes of the bilateral hips. The  sacroiliac joints and pubic symphysis are anatomically aligned. Vascular calcifications are present. IMPRESSION: No acute osseous abnormality. Electronically Signed  By: Hart Robinsons M.D.   On: 12/03/2023 16:38   DG Shoulder Left Result Date: 12/03/2023 CLINICAL DATA:  Pain after falling down steps. EXAM: LEFT SHOULDER - 2+ VIEW COMPARISON:  None Available. FINDINGS: There is no evidence of acute fracture or dislocation. The glenohumeral joint is anatomically aligned with joint space narrowing and marginal osteophytes. The acromioclavicular joint is anatomically aligned with mild degenerative changes. Soft tissues are unremarkable. IMPRESSION: 1. No acute fracture or dislocation. 2. 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 Electronically Signed   By: Hart Robinsons M.D.   On: 12/03/2023 16:36   DG Humerus Left Result Date: 12/03/2023 CLINICAL DATA:  Pain after falling down steps. EXAM: LEFT HUMERUS - 2+ VIEW COMPARISON:  None Available. FINDINGS: There is no evidence of acute fracture or other focal bone lesions. Soft tissues are unremarkable. IMPRESSION: No acute osseous abnormality. Electronically Signed   By: Hart Robinsons M.D.   On: 12/03/2023 16:34   CT HEAD WO CONTRAST ( ) Result Date: 12/03/2023 CLINICAL DATA:  Head trauma, minor (Age >= 65y); Facial trauma, blunt; Neck trauma (Age >= 65y) EXAM: CT HEAD WITHOUT CONTRAST CT MAXILLOFACIAL WITHOUT CONTRAST CT CERVICAL SPINE WITHOUT CONTRAST TECHNIQUE: Multidetector CT imaging of the head, cervical spine, and maxillofacial structures were performed using the standard protocol without intravenous contrast. Multiplanar CT image reconstructions of the cervical spine and maxillofacial structures were also generated. RADIATION DOSE REDUCTION: This exam was performed according to the departmental dose-optimization program which includes automated exposure control, adjustment of the mA and/or kV according to patient size  and/or use of iterative reconstruction technique. COMPARISON:  None Available. FINDINGS: CT HEAD FINDINGS Brain: No evidence of acute infarction, hemorrhage, hydrocephalus, extra-axial collection or mass lesion/mass effect. Background of mild-to-moderate chronic microvascular ischemic change. Vascular: No hyperdense vessel or unexpected calcification. Skull: Normal. Negative for fracture or focal lesion. Other: None. CT MAXILLOFACIAL FINDINGS Osseous: No fracture or mandibular dislocation. No destructive process. Orbits: Negative. No traumatic or inflammatory finding. Sinuses: No middle ear or mastoid effusion. Bilateral maxillary sinuses. Soft tissues: Soft tissue swelling in the infraorbital soft tissues on the left CT CERVICAL SPINE FINDINGS Alignment: Grade 1 anterolisthesis of C2 on C3 and C3 on C4. Grade 1 anterolisthesis of C6 on C7. Skull base and vertebrae: No acute fracture. No primary bone lesion or focal pathologic process. Soft tissues and spinal canal: No prevertebral fluid or swelling. No visible canal hematoma. Disc levels:  No CT evidence of high-grade spinal canal stenosis. Upper chest: Negative. Other: None IMPRESSION: 1. No acute intracranial abnormality. 2. No acute facial bone fracture. 3. No acute fracture or traumatic subluxation of the cervical spine. 4. Soft tissue swelling in the infraorbital soft tissues on the left. Electronically Signed   By: Lorenza Cambridge M.D.   On: 12/03/2023 15:17   CT Cervical Spine Wo Contrast Result Date: 12/03/2023 CLINICAL DATA:  Head trauma, minor (Age >= 65y); Facial trauma, blunt; Neck trauma (Age >= 65y) EXAM: CT HEAD WITHOUT CONTRAST CT MAXILLOFACIAL WITHOUT CONTRAST CT CERVICAL SPINE WITHOUT CONTRAST TECHNIQUE: Multidetector CT imaging of the head, cervical spine, and maxillofacial structures were performed using the standard protocol without intravenous contrast. Multiplanar CT image reconstructions of the cervical spine and maxillofacial structures  were also generated. RADIATION DOSE REDUCTION: This exam was performed according to the departmental dose-optimization program which includes automated exposure control, adjustment of the mA and/or kV according to patient size and/or use of iterative reconstruction technique. COMPARISON:  None Available. FINDINGS: CT HEAD FINDINGS Brain:  No evidence of acute infarction, hemorrhage, hydrocephalus, extra-axial collection or mass lesion/mass effect. Background of mild-to-moderate chronic microvascular ischemic change. Vascular: No hyperdense vessel or unexpected calcification. Skull: Normal. Negative for fracture or focal lesion. Other: None. CT MAXILLOFACIAL FINDINGS Osseous: No fracture or mandibular dislocation. No destructive process. Orbits: Negative. No traumatic or inflammatory finding. Sinuses: No middle ear or mastoid effusion. Bilateral maxillary sinuses. Soft tissues: Soft tissue swelling in the infraorbital soft tissues on the left CT CERVICAL SPINE FINDINGS Alignment: Grade 1 anterolisthesis of C2 on C3 and C3 on C4. Grade 1 anterolisthesis of C6 on C7. Skull base and vertebrae: No acute fracture. No primary bone lesion or focal pathologic process. Soft tissues and spinal canal: No prevertebral fluid or swelling. No visible canal hematoma. Disc levels:  No CT evidence of high-grade spinal canal stenosis. Upper chest: Negative. Other: None IMPRESSION: 1. No acute intracranial abnormality. 2. No acute facial bone fracture. 3. No acute fracture or traumatic subluxation of the cervical spine. 4. Soft tissue swelling in the infraorbital soft tissues on the left. Electronically Signed   By: Lorenza Cambridge M.D.   On: 12/03/2023 15:17   CT Maxillofacial Wo Contrast Result Date: 12/03/2023 CLINICAL DATA:  Head trauma, minor (Age >= 65y); Facial trauma, blunt; Neck trauma (Age >= 65y) EXAM: CT HEAD WITHOUT CONTRAST CT MAXILLOFACIAL WITHOUT CONTRAST CT CERVICAL SPINE WITHOUT CONTRAST TECHNIQUE: Multidetector CT  imaging of the head, cervical spine, and maxillofacial structures were performed using the standard protocol without intravenous contrast. Multiplanar CT image reconstructions of the cervical spine and maxillofacial structures were also generated. RADIATION DOSE REDUCTION: This exam was performed according to the departmental dose-optimization program which includes automated exposure control, adjustment of the mA and/or kV according to patient size and/or use of iterative reconstruction technique. COMPARISON:  None Available. FINDINGS: CT HEAD FINDINGS Brain: No evidence of acute infarction, hemorrhage, hydrocephalus, extra-axial collection or mass lesion/mass effect. Background of mild-to-moderate chronic microvascular ischemic change. Vascular: No hyperdense vessel or unexpected calcification. Skull: Normal. Negative for fracture or focal lesion. Other: None. CT MAXILLOFACIAL FINDINGS Osseous: No fracture or mandibular dislocation. No destructive process. Orbits: Negative. No traumatic or inflammatory finding. Sinuses: No middle ear or mastoid effusion. Bilateral maxillary sinuses. Soft tissues: Soft tissue swelling in the infraorbital soft tissues on the left CT CERVICAL SPINE FINDINGS Alignment: Grade 1 anterolisthesis of C2 on C3 and C3 on C4. Grade 1 anterolisthesis of C6 on C7. Skull base and vertebrae: No acute fracture. No primary bone lesion or focal pathologic process. Soft tissues and spinal canal: No prevertebral fluid or swelling. No visible canal hematoma. Disc levels:  No CT evidence of high-grade spinal canal stenosis. Upper chest: Negative. Other: None IMPRESSION: 1. No acute intracranial abnormality. 2. No acute facial bone fracture. 3. No acute fracture or traumatic subluxation of the cervical spine. 4. Soft tissue swelling in the infraorbital soft tissues on the left. Electronically Signed   By: Lorenza Cambridge M.D.   On: 12/03/2023 15:17    Assessment/Plan  Atherosclerosis of native  arteries of extremity with intermittent claudication (HCC) Her ABI today on the right is significantly improved and now in the normal range at 1.07 with multiphasic waveforms.  Her left ABI remains in the normal range at 1.01. She has gotten an excellent result on her right leg after revascularization. Her ABI on that side was fairly normal as well.  She is adamant that her symptoms are very similar on the left leg as they were on the right  before revascularization, and she is very interested in having an angiogram for further evaluation.  I do think this is reasonable given her good results on the right with a nearly normal ABI on the right as well.  I have discussed that it is possible we will not find significant disease to treat, and she voices her understanding about this.  We will plan left lower extremity angiogram with possible revascularization in the near future at her convenience.   Essential hypertension blood pressure control important in reducing the progression of atherosclerotic disease. On appropriate oral medications.     HLD (hyperlipidemia) lipid control important in reducing the progression of atherosclerotic disease. On Fish oil and Tricor   Selinda Gu, MD  12/28/2023 5:28 PM    This note was created with Dragon medical transcription system.  Any errors from dictation are purely unintentional

## 2023-12-29 ENCOUNTER — Telehealth (INDEPENDENT_AMBULATORY_CARE_PROVIDER_SITE_OTHER): Payer: Self-pay

## 2023-12-29 LAB — VAS US ABI WITH/WO TBI
Left ABI: 1.01
Right ABI: 1.07

## 2023-12-29 NOTE — Telephone Encounter (Signed)
 Spoke with the patient and she is scheduled with Dr. Vonna Guardian on 01/03/24 with a 9:30 am arrival time to the [facility] for a left leg angio. Pre-procedure instructions were discussed and will be sent to Mychart  and mailed.

## 2023-12-30 ENCOUNTER — Encounter: Payer: Self-pay | Admitting: Family

## 2023-12-30 ENCOUNTER — Ambulatory Visit: Payer: Medicare Other | Admitting: Family

## 2023-12-30 VITALS — BP 124/68 | HR 62 | Temp 97.9°F | Ht 59.0 in | Wt 166.4 lb

## 2023-12-30 DIAGNOSIS — I10 Essential (primary) hypertension: Secondary | ICD-10-CM

## 2023-12-30 DIAGNOSIS — N3000 Acute cystitis without hematuria: Secondary | ICD-10-CM | POA: Diagnosis not present

## 2023-12-30 DIAGNOSIS — N390 Urinary tract infection, site not specified: Secondary | ICD-10-CM | POA: Insufficient documentation

## 2023-12-30 NOTE — Assessment & Plan Note (Signed)
Chronic, stable.continue carvedilol 12.5mg  twice daily, amlodipine 10 mg daily, losartan 100 mg daily

## 2023-12-30 NOTE — Assessment & Plan Note (Signed)
Asymptomatic today.  Suspect UTI has resolved.  Pending urinalysis to ensure white blood cells decreased.  Will monitor for recurrence.

## 2023-12-30 NOTE — Progress Notes (Signed)
Assessment & Plan:  Acute cystitis without hematuria Assessment & Plan: Asymptomatic today.  Suspect UTI has resolved.  Pending urinalysis to ensure white blood cells decreased.  Will monitor for recurrence.    Of note, discussed recent fall and emergency room evaluation.  Patient politely declines physical therapy consult at this time.  She will let me know in the future if she would like to pursue.     Return precautions given.   Risks, benefits, and alternatives of the medications and treatment plan prescribed today were discussed, and patient expressed understanding.   Education regarding symptom management and diagnosis given to patient on AVS either electronically or printed.  No follow-ups on file.  Rennie Plowman, FNP  Subjective:    Patient ID: Kristen Powell, female    DOB: 1946-07-14, 78 y.o.   MRN: [id number]  CC: Kristen Powell is a 78 y.o. female who presents today for follow up.   HPI: She feels well today.  Left sided facial swelling has improved after fall 3 weeks ago.   No recurrence of falls.   Denies HA, vision changes.    Doing well on amlodipine, carvedilol, and losartan.   denies chest pain     Denies dysuria, urinary frequency.  She has been compliant with cefdinir 7 day course.  She has 1 tablet left to take.   Scheduled for lower extremity angiography with Dr. Wyn Quaker 01/03/2024 Urine culture with E. coli 12/22/2023 Urinalysis positive nitrites, white blood cells.  No RBCs  Seen emergency room 12/03/2023 after fall.  Contusion to the face.  She describes going up steps and 'lost her balance' and felt right hand and left side of face. No loss of consciousness X-ray performed of the left hip, right hand, left humerus, left shoulder, CT maxillofacial, cervical spine and head.  No acute fracture   Allergies: Hydrochlorothiazide and Adhesive [tape] Current Outpatient Medications on File Prior to Visit  Medication Sig Dispense Refill    acetaminophen (TYLENOL) 500 MG tablet Take 1-2 tablets (500-1,000 mg total) by mouth every 4 (four) hours as needed for fever. 30 tablet 0   amLODipine (NORVASC) 10 MG tablet Take 1 tablet (10 mg total) by mouth daily. 90 tablet 3   aspirin 81 MG tablet Take 81 mg by mouth daily.     atorvastatin (LIPITOR) 40 MG tablet Take 1 tablet (40 mg total) by mouth daily. 90 tablet 3   calcium carbonate (OS-CAL) 600 MG TABS tablet Take 600 mg by mouth daily as needed.     carvedilol (COREG) 12.5 MG tablet Take 1 tablet (12.5 mg total) by mouth 2 (two) times daily with a meal. 180 tablet 1   cefdinir (OMNICEF) 300 MG capsule Take 1 capsule (300 mg total) by mouth 2 (two) times daily for 7 days. 14 capsule 0   Cholecalciferol (VITAMIN D) 2000 UNITS CAPS Take by mouth daily. Patient is taking 3000 units instead of 2000     citalopram (CELEXA) 10 MG tablet Take 1 tablet (10 mg total) by mouth daily. 90 tablet 3   clopidogrel (PLAVIX) 75 MG tablet Take 1 tablet (75 mg total) by mouth daily. 30 tablet 11   fenofibrate (TRICOR) 145 MG tablet TAKE 1 TABLET BY MOUTH EVERY DAY 90 tablet 3   gabapentin (NEURONTIN) 100 MG capsule Take 1 capsule (100 mg total) by mouth at bedtime. 90 capsule 3   LORazepam (ATIVAN) 0.5 MG tablet Take 0.5-1 tablets (0.25-0.5 mg total) by mouth daily as needed for  anxiety. 30 tablet 2   losartan (COZAAR) 100 MG tablet TAKE 1 TABLET (100 MG TOTAL) BY MOUTH DAILY. D/C 50 MG 90 tablet 3   Omega-3 Fatty Acids (FISH OIL) 1200 MG CAPS Take 1 capsule by mouth 2 (two) times daily.     omeprazole (PRILOSEC) 20 MG capsule Take 1 capsule (20 mg total) by mouth daily.     vitamin B-12 (CYANOCOBALAMIN) 1000 MCG tablet Take 1,000 mcg by mouth daily.     No current facility-administered medications on file prior to visit.    Review of Systems  Constitutional:  Negative for chills and fever.  Eyes:  Negative for visual disturbance.  Respiratory:  Negative for cough.   Cardiovascular:  Negative for  chest pain and palpitations.  Gastrointestinal:  Negative for nausea and vomiting.  Genitourinary:  Negative for dysuria.  Neurological:  Negative for headaches.      Objective:    BP 124/68   Pulse 62   Temp 97.9 F (36.6 C) (Oral)   Ht 4\' 11"  (1.499 m)   Wt 166 lb 6.4 oz (75.5 kg)   SpO2 98%   BMI 33.61 kg/m  BP Readings from Last 3 Encounters:  12/30/23 124/68  12/28/23 (!) 148/69  12/03/23 (!) 166/59   Wt Readings from Last 3 Encounters:  12/30/23 166 lb 6.4 oz (75.5 kg)  12/28/23 164 lb (74.4 kg)  12/03/23 164 lb (74.4 kg)    Physical Exam Vitals reviewed.  Constitutional:      Appearance: She is well-developed.  Eyes:     Conjunctiva/sclera: Conjunctivae normal.  Cardiovascular:     Rate and Rhythm: Normal rate and regular rhythm.     Pulses: Normal pulses.     Heart sounds: Normal heart sounds.  Pulmonary:     Effort: Pulmonary effort is normal.     Breath sounds: Normal breath sounds. No wheezing, rhonchi or rales.  Skin:    General: Skin is warm and dry.  Neurological:     Mental Status: She is alert.  Psychiatric:        Speech: Speech normal.        Behavior: Behavior normal.        Thought Content: Thought content normal.

## 2024-01-03 ENCOUNTER — Ambulatory Visit
Admission: RE | Admit: 2024-01-03 | Discharge: 2024-01-03 | Disposition: A | Discharge status: Home / Self Care | Payer: Medicare Other | Attending: Vascular Surgery | Admitting: Vascular Surgery

## 2024-01-03 ENCOUNTER — Encounter: Payer: Self-pay | Admitting: Vascular Surgery

## 2024-01-03 ENCOUNTER — Encounter
Admission: RE | Disposition: A | Discharge status: Home / Self Care | Payer: Self-pay | Source: Home / Self Care | Attending: Vascular Surgery

## 2024-01-03 ENCOUNTER — Other Ambulatory Visit: Payer: Self-pay

## 2024-01-03 DIAGNOSIS — I70212 Atherosclerosis of native arteries of extremities with intermittent claudication, left leg: Secondary | ICD-10-CM | POA: Insufficient documentation

## 2024-01-03 DIAGNOSIS — Z9889 Other specified postprocedural states: Secondary | ICD-10-CM | POA: Diagnosis not present

## 2024-01-03 DIAGNOSIS — E785 Hyperlipidemia, unspecified: Secondary | ICD-10-CM | POA: Insufficient documentation

## 2024-01-03 DIAGNOSIS — Z8249 Family history of ischemic heart disease and other diseases of the circulatory system: Secondary | ICD-10-CM | POA: Insufficient documentation

## 2024-01-03 DIAGNOSIS — I70219 Atherosclerosis of native arteries of extremities with intermittent claudication, unspecified extremity: Secondary | ICD-10-CM

## 2024-01-03 DIAGNOSIS — I10 Essential (primary) hypertension: Secondary | ICD-10-CM | POA: Diagnosis not present

## 2024-01-03 HISTORY — PX: LOWER EXTREMITY ANGIOGRAPHY: CATH118251

## 2024-01-03 LAB — BUN: BUN: 23 mg/dL (ref 8–23)

## 2024-01-03 LAB — CREATININE, SERUM
Creatinine, Ser: 0.93 mg/dL (ref 0.44–1.00)
GFR, Estimated: >60 mL/min (ref >60)

## 2024-01-03 SURGERY — LOWER EXTREMITY ANGIOGRAPHY
Anesthesia: Moderate Sedation | Site: Leg Lower | Laterality: Left

## 2024-01-03 MED ORDER — SODIUM CHLORIDE 0.9% FLUSH: 3.0000 mL | Freq: Two times a day (BID) | INTRAVENOUS | Status: DC

## 2024-01-03 MED ORDER — HYDROMORPHONE HCL 1 MG/ML IJ SOLN: 1.0000 mg | Freq: Once | INTRAMUSCULAR | Status: DC | PRN

## 2024-01-03 MED ORDER — MIDAZOLAM HCL 5 MG/5ML IJ SOLN
INTRAMUSCULAR | Status: AC
  Filled 2024-01-03: qty 5

## 2024-01-03 MED ORDER — ONDANSETRON HCL 4 MG/2ML IJ SOLN: 4.0000 mg | Freq: Four times a day (QID) | INTRAMUSCULAR | Status: DC | PRN

## 2024-01-03 MED ORDER — FAMOTIDINE 20 MG PO TABS: 40.0000 mg | ORAL_TABLET | Freq: Once | ORAL | Status: DC | PRN

## 2024-01-03 MED ORDER — DIPHENHYDRAMINE HCL 50 MG/ML IJ SOLN: 50.0000 mg | Freq: Once | INTRAMUSCULAR | Status: DC | PRN

## 2024-01-03 MED ORDER — HEPARIN SODIUM (PORCINE) 1000 UNIT/ML IJ SOLN
INTRAMUSCULAR | Status: DC | PRN
  Administered 2024-01-03: 5000 [IU] via INTRAVENOUS

## 2024-01-03 MED ORDER — FENTANYL CITRATE (PF) 100 MCG/2ML IJ SOLN
INTRAMUSCULAR | Status: AC
  Filled 2024-01-03: qty 2

## 2024-01-03 MED ORDER — FENTANYL CITRATE (PF) 100 MCG/2ML IJ SOLN
INTRAMUSCULAR | Status: DC | PRN
  Administered 2024-01-03: 50 ug via INTRAVENOUS

## 2024-01-03 MED ORDER — MIDAZOLAM HCL 2 MG/ML PO SYRP: 8.0000 mg | ORAL_SOLUTION | Freq: Once | ORAL | Status: DC | PRN

## 2024-01-03 MED ORDER — LABETALOL HCL 5 MG/ML IV SOLN: 10.0000 mg | INTRAVENOUS | Status: DC | PRN

## 2024-01-03 MED ORDER — LIDOCAINE-EPINEPHRINE (PF) 1 %-1:200000 IJ SOLN
INTRAMUSCULAR | Status: DC | PRN
  Administered 2024-01-03: 10 mL

## 2024-01-03 MED ORDER — SODIUM CHLORIDE 0.9 % IV SOLN
250.0000 mL | INTRAVENOUS | Status: DC | PRN
Start: 2024-01-03 — End: 2024-01-03

## 2024-01-03 MED ORDER — HYDRALAZINE HCL 20 MG/ML IJ SOLN: 5.0000 mg | INTRAMUSCULAR | Status: DC | PRN

## 2024-01-03 MED ORDER — ACETAMINOPHEN 325 MG PO TABS: 650.0000 mg | ORAL_TABLET | ORAL | Status: DC | PRN

## 2024-01-03 MED ORDER — IODIXANOL 320 MG/ML IV SOLN
INTRAVENOUS | Status: DC | PRN
  Administered 2024-01-03: 45 mL

## 2024-01-03 MED ORDER — SODIUM CHLORIDE 0.9 % IV SOLN
INTRAVENOUS | Status: DC
Start: 2024-01-03 — End: 2024-01-03

## 2024-01-03 MED ORDER — HEPARIN (PORCINE) IN NACL 1000-0.9 UT/500ML-% IV SOLN
INTRAVENOUS | Status: DC | PRN
  Administered 2024-01-03: 1000 mL

## 2024-01-03 MED ORDER — MIDAZOLAM HCL 2 MG/2ML IJ SOLN
INTRAMUSCULAR | Status: DC | PRN
  Administered 2024-01-03: 2 mg via INTRAVENOUS

## 2024-01-03 MED ORDER — SODIUM CHLORIDE 0.9 % IV SOLN: INTRAVENOUS | Status: DC

## 2024-01-03 MED ORDER — CEFAZOLIN SODIUM-DEXTROSE 2-4 GM/100ML-% IV SOLN
INTRAVENOUS | Status: AC
Start: 2024-01-03 — End: ?
  Filled 2024-01-03: qty 100

## 2024-01-03 MED ORDER — CEFAZOLIN SODIUM-DEXTROSE 2-4 GM/100ML-% IV SOLN
2.0000 g | INTRAVENOUS | Status: AC
  Administered 2024-01-03: 2 g via INTRAVENOUS

## 2024-01-03 MED ORDER — HEPARIN SODIUM (PORCINE) 1000 UNIT/ML IJ SOLN
INTRAMUSCULAR | Status: AC
  Filled 2024-01-03: qty 10

## 2024-01-03 MED ORDER — METHYLPREDNISOLONE SODIUM SUCC 125 MG IJ SOLR: 125.0000 mg | Freq: Once | INTRAMUSCULAR | Status: DC | PRN

## 2024-01-03 MED ORDER — SODIUM CHLORIDE 0.9% FLUSH: 3.0000 mL | INTRAVENOUS | Status: DC | PRN

## 2024-01-03 SURGICAL SUPPLY — 17 items
BALLN LUTONIX 5X150X130 (BALLOONS) ×1
BALLN LUTONIX DCB 5X100X130 (BALLOONS) ×1
BALLOON LUTONIX 5X150X130 (BALLOONS) IMPLANT
BALLOON LUTONIX DCB 5X100X130 (BALLOONS) IMPLANT
CATH ANGIO 5F PIGTAIL 65CM (CATHETERS) IMPLANT
CATH VERT 5X100 (CATHETERS) IMPLANT
COVER PROBE ULTRASOUND 5X96 (MISCELLANEOUS) IMPLANT
DEVICE PRESTO INFLATION (MISCELLANEOUS) IMPLANT
DEVICE STARCLOSE SE CLOSURE (Vascular Products) IMPLANT
GLIDEWIRE ADV .035X260CM (WIRE) IMPLANT
PACK ANGIOGRAPHY (CUSTOM PROCEDURE TRAY) ×1 IMPLANT
SHEATH ANL2 6FRX45 HC (SHEATH) IMPLANT
SHEATH BRITE TIP 5FRX11 (SHEATH) IMPLANT
STENT LIFESTENT 5F 6X120X135 (Permanent Stent) IMPLANT
SYR MEDRAD MARK 7 150ML (SYRINGE) IMPLANT
TUBING CONTRAST HIGH PRESS 72 (TUBING) IMPLANT
WIRE GUIDERIGHT .035X150 (WIRE) IMPLANT

## 2024-01-03 NOTE — Op Note (Signed)
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  Ultrasound guidance for vascular access right femoral artery             2.  Catheter placement into left common femoral artery from right femoral approach             3.  Aortogram and selective left lower extremity angiogram             4.  Percutaneous transluminal angioplasty of left distal SFA with 5 mm diameter by 10 cm length Lutonix drug-coated angioplasty balloon             5.  Stent placement to the left distal SFA with 6 mm diameter by 12 cm length lifestent  6.  StarClose closure device right femoral artery  EBL: 5 cc  Contrast: 45 cc  Fluoro Time: 2.4 minutes  Moderate Conscious Sedation Time: approximately 23 minutes using 2 mg of Versed and 50 mcg of Fentanyl              Indications:  Patient is a 78 y.o.female with lifestyle limiting claudication symptoms of the left lower extremity status post similar treatment on the right side with excellent results. The patient is brought in for angiography for further evaluation and potential treatment.  Risks and benefits are discussed and informed consent is obtained.   Procedure:  The patient was identified and appropriate procedural time out was performed.  The patient was then placed supine on the table and prepped and draped in the usual sterile fashion. Moderate conscious sedation was administered during a face to face encounter with the patient throughout the procedure with my supervision of the RN administering medicines and monitoring the patient's vital signs, pulse oximetry, telemetry and mental status throughout from the start of the procedure until the patient was taken to the recovery room. Ultrasound was used to  evaluate the right common femoral artery.  It was patent .  A digital ultrasound image was acquired.  A Seldinger needle was used to access the right common femoral artery under direct ultrasound guidance and a permanent image was performed.  A 0.035 J wire was advanced without resistance and a 5Fr sheath was placed.  Pigtail catheter was placed into the aorta and an AP aortogram was performed. This demonstrated normal renal arteries and normal aorta and iliac segments without significant stenosis. I then crossed the aortic bifurcation and advanced to the left femoral head. Selective left lower extremity angiogram was then performed. This demonstrated fairly normal common femoral artery, profunda femoris artery, and proximal superficial femoral artery.  In the mid to distal superficial femoral artery there was significant disease over about 8 to 10 cm span.  The tightest area appeared to be in the 80% range.  The above-knee popliteal artery normalized and there was normal flow through the remainder the popliteal artery.  The anterior tibial artery was the dominant runoff down into the foot with a small peroneal artery providing a second runoff vessel distally.  The posterior tibial artery was chronically occluded. It was felt that it was in the patient's best interest to proceed with intervention after these images to avoid a second procedure and a larger amount of contrast and fluoroscopy based off  of the findings from the initial angiogram. The patient was systemically heparinized and a 6 [place] [employer] sheath was then placed over the [employer] wire. I then used a Kumpe catheter and the advantage wire to cross the SFA disease without difficulty.  The wire was then parked in the tibial vessels.  I then performed angioplasty of the left distal SFA with a 5 mm diameter by 10 cm length Lutonix drug-coated angioplasty balloon.  This was inflated to 8 atm for 1 minute.  Completion imaging showed significant  residual disease in this area creating greater than 50% stenosis so I elected to place a stent throughout the area.  A 6 mm diameter by 12 cm length life star stent was then selected and deployed and postdilated with a 5 mm diameter Lutonix drug-coated balloon with excellent angiographic completion result and less than 10% residual stenosis. I elected to terminate the procedure. The sheath was removed and StarClose closure device was deployed in the right femoral artery with excellent hemostatic result. The patient was taken to the recovery room in stable condition having tolerated the procedure well.  Findings:               Aortogram:  This demonstrated normal renal arteries and normal aorta and iliac segments without significant stenosis.             Left Lower Extremity:  This demonstrated fairly normal common femoral artery, profunda femoris artery, and proximal superficial femoral artery.  In the mid to distal superficial femoral artery there was significant disease over about 8 to 10 cm span.  The tightest area appeared to be in the 80% range.  The above-knee popliteal artery normalized and there was normal flow through the remainder the popliteal artery.  The anterior tibial artery was the dominant runoff down into the foot with a small peroneal artery providing a second runoff vessel distally.  The posterior tibial artery was chronically occluded.   Disposition: Patient was taken to the recovery room in stable condition having tolerated the procedure well.  Complications: None  Festus Barren 01/03/2024 10:58 AM   This note was created with Dragon Medical transcription system. Any errors in dictation are purely unintentional.

## 2024-01-03 NOTE — Interval H&P Note (Signed)
History and Physical Interval Note:  01/03/2024 9:02 AM  Kristen Powell  has presented today for surgery, with the diagnosis of LLE Angio   ASO w claudication.  The various methods of treatment have been discussed with the patient and family. After consideration of risks, benefits and other options for treatment, the patient has consented to  Procedure(s): Lower Extremity Angiography (Left) as a surgical intervention.  The patient's history has been reviewed, patient examined, no change in status, stable for surgery.  I have reviewed the patient's chart and labs.  Questions were answered to the patient's satisfaction.     Festus Barren

## 2024-01-10 ENCOUNTER — Telehealth (INDEPENDENT_AMBULATORY_CARE_PROVIDER_SITE_OTHER): Payer: Self-pay

## 2024-01-10 NOTE — Telephone Encounter (Signed)
Patient would like to schedule follow appointment from left le angio done on 01/03/24.

## 2024-01-11 ENCOUNTER — Ambulatory Visit
Admission: RE | Admit: 2024-01-11 | Discharge: 2024-01-11 | Disposition: A | Discharge status: Home / Self Care | Payer: Medicare Other | Source: Ambulatory Visit | Attending: Family | Admitting: Family

## 2024-01-11 DIAGNOSIS — Z1231 Encounter for screening mammogram for malignant neoplasm of breast: Secondary | ICD-10-CM

## 2024-01-27 ENCOUNTER — Encounter: Payer: Self-pay | Admitting: Emergency Medicine

## 2024-01-27 ENCOUNTER — Ambulatory Visit
Admission: EM | Admit: 2024-01-27 | Discharge: 2024-01-27 | Disposition: A | Discharge status: Home / Self Care | Payer: Medicare Other | Attending: Emergency Medicine | Admitting: Emergency Medicine

## 2024-01-27 ENCOUNTER — Other Ambulatory Visit: Payer: Self-pay

## 2024-01-27 DIAGNOSIS — B349 Viral infection, unspecified: Secondary | ICD-10-CM

## 2024-01-27 LAB — POC COVID19/FLU A&B COMBO
Covid Antigen, POC: NEGATIVE
Influenza A Antigen, POC: NEGATIVE
Influenza B Antigen, POC: NEGATIVE

## 2024-01-27 MED ORDER — BENZONATATE 100 MG PO CAPS: 100.0000 mg | ORAL_CAPSULE | Freq: Three times a day (TID) | ORAL | 0 refills | Status: DC | PRN

## 2024-01-27 NOTE — ED Provider Notes (Signed)
[name]    CSN: [id number] Arrival date & time: 01/27/24  1309      History   Chief Complaint Chief Complaint  Patient presents with   URI    HPI Kristen Powell is a 78 y.o. female.  Patient presents on day 4 of chills, fatigue, runny nose, congestion, cough, headache.  No fever, shortness of breath, chest pain.  She has been treating her symptoms with Coricidin HBP; last taken this morning.    The history is provided by the patient and medical records.    Past Medical History:  Diagnosis Date   Anxiety    COVID-19    Diastolic dysfunction    a. 12/2021 Echo: EF 60-65%, no rwma, GrI DD, nl RV fxn, RVSP 36.94mmHg, mild MR.   Hyperlipidemia    Hypertension    Mild Mitral regurgitation    a. 12/2021 Echo: Mild MR.   Psoriasis    younger age   Spinal stenosis    Traumatic rupture of left ear drum     Patient Active Problem List   Diagnosis Date Noted   UTI (urinary tract infection) 12/30/2023   Contact dermatitis due to plant 07/07/2023   Atherosclerosis of native arteries of extremity with intermittent claudication (HCC) 03/31/2023   COVID-19 03/03/2023   Chronic pain of both feet 09/18/2022   Hyponatremia 02/04/2022   Colon cancer screening    Anxiety 06/20/2021   B12 deficiency due to diet 04/29/2021   Screening for blood or protein in urine 04/29/2021   S/P partial hysterectomy- has ovaries.  04/29/2021   Chronic sciatica of right side 08/07/2019   Vitamin D deficiency 07/27/2016   Generalized anxiety disorder 08/13/2014   Degenerative arthritis of knee 05/14/2014   Essential hypertension 01/05/2014   HLD (hyperlipidemia) 01/05/2014   GERD (gastroesophageal reflux disease) 01/05/2014    Past Surgical History:  Procedure Laterality Date   ABDOMINAL HYSTERECTOMY  12/14/1978   Partial   BASAL CELL CARCINOMA EXCISION  2006,2008,2011   BUNIONECTOMY     BUNIONECTOMY Bilateral 12/14/1990   COLONOSCOPY WITH PROPOFOL N/A 12/09/2021    Procedure: COLONOSCOPY WITH PROPOFOL;  Surgeon: Midge Minium, MD;  Location: [facility] ENDOSCOPY;  Service: Endoscopy;  Laterality: N/A;   KNEE ARTHROSCOPY     KNEE CLOSED REDUCTION  05/07/2015   Procedure: CLOSED MANIPULATION KNEE;  Surgeon: Kennedy Bucker, MD;  Location: [facility] ORS;  Service: Orthopedics;;   KNEE SURGERY Bilateral 2005,2006   Repair   LOWER EXTREMITY ANGIOGRAPHY Right 11/22/2023   Procedure: Lower Extremity Angiography;  Surgeon: Annice Needy, MD;  Location: [facility] INVASIVE CV LAB;  Service: Cardiovascular;  Laterality: Right;   LOWER EXTREMITY ANGIOGRAPHY Left 01/03/2024   Procedure: Lower Extremity Angiography;  Surgeon: Annice Needy, MD;  Location: [facility] INVASIVE CV LAB;  Service: Cardiovascular;  Laterality: Left;   MOHS SURGERY     nose bcc, left cheeck Dr. Meredith Mody f/u Q 12/2020   PERIPHERAL VASCULAR CATHETERIZATION  11/22/2023   REPLACEMENT TOTAL KNEE Left 04/11/2015   manipulation--05/07/2015   ROTATOR CUFF REPAIR W/ DISTAL CLAVICLE EXCISION     SHOULDER SURGERY Right    x2   SKIN CANCER EXCISION     TOTAL KNEE ARTHROPLASTY     VAGINAL HYSTERECTOMY      OB History     Gravida  0   Para  0   Term  0   Preterm  0   AB  0   Living  SAB  0   IAB  0   Ectopic  0   Multiple      Live Births               Home Medications    Prior to Admission medications   Medication Sig Start Date End Date Taking? Authorizing Provider  benzonatate (TESSALON) 100 MG capsule Take 1 capsule (100 mg total) by mouth 3 (three) times daily as needed for cough. 01/27/24  Yes Mickie Bail, NP  acetaminophen (TYLENOL) 500 MG tablet Take 1-2 tablets (500-1,000 mg total) by mouth every 4 (four) hours as needed for fever. 04/15/15   Tera Partridge, PA  amLODipine (NORVASC) 10 MG tablet Take 1 tablet (10 mg total) by mouth daily. 08/24/23 08/18/24  Debbe Odea, MD  aspirin 81 MG tablet Take 81 mg by mouth daily.    [provider]  atorvastatin (LIPITOR) 40 MG  tablet Take 1 tablet (40 mg total) by mouth daily. 05/24/23 09/27/24  Debbe Odea, MD  calcium carbonate (OS-CAL) 600 MG TABS tablet Take 600 mg by mouth daily as needed.    [provider]  carvedilol (COREG) 12.5 MG tablet Take 1 tablet (12.5 mg total) by mouth 2 (two) times daily with a meal. 06/29/23   Debbe Odea, MD  Cholecalciferol (VITAMIN D) 2000 UNITS CAPS Take by mouth daily. Patient is taking 3000 units instead of 2000    [provider]  citalopram (CELEXA) 10 MG tablet Take 1 tablet (10 mg total) by mouth daily. 03/22/23   Allegra Grana, FNP  clopidogrel (PLAVIX) 75 MG tablet Take 1 tablet (75 mg total) by mouth daily. 11/22/23   Annice Needy, MD  fenofibrate (TRICOR) 145 MG tablet TAKE 1 TABLET BY MOUTH EVERY DAY 08/26/23   Allegra Grana, FNP  gabapentin (NEURONTIN) 100 MG capsule Take 1 capsule (100 mg total) by mouth at bedtime. 08/26/23   Allegra Grana, FNP  LORazepam (ATIVAN) 0.5 MG tablet Take 0.5-1 tablets (0.25-0.5 mg total) by mouth daily as needed for anxiety. 07/07/23   Sherlene Shams, MD  losartan (COZAAR) 100 MG tablet TAKE 1 TABLET (100 MG TOTAL) BY MOUTH DAILY. D/C 50 MG 06/01/23   Allegra Grana, FNP  Omega-3 Fatty Acids (FISH OIL) 1200 MG CAPS Take 1 capsule by mouth 2 (two) times daily.    [provider]  omeprazole (PRILOSEC) 20 MG capsule Take 1 capsule (20 mg total) by mouth daily. 09/21/23   Allegra Grana, FNP  vitamin B-12 (CYANOCOBALAMIN) 1000 MCG tablet Take 1,000 mcg by mouth daily.    [provider]    Family History Family History  Problem Relation Age of Onset   Cancer Mother        lung   Hypertension Sister    Heart attack Sister 19   Breast cancer Paternal Aunt     Social History Social History   Tobacco Use   Smoking status: Never   Smokeless tobacco: Never  Vaping Use   Vaping status: Never Used  Substance Use Topics   Alcohol use: No   Drug use: No     Allergies    Hydrochlorothiazide and Adhesive [tape]   Review of Systems Review of Systems  Constitutional:  Positive for chills and fatigue. Negative for fever.  HENT:  Positive for congestion and rhinorrhea. Negative for ear pain and sore throat.   Respiratory:  Positive for cough. Negative for shortness of breath.   Cardiovascular:  Negative for chest pain and palpitations.  Neurological:  Positive for headaches.     Physical Exam Triage Vital Signs ED Triage Vitals  Encounter Vitals Group     BP 01/27/24 1404 (!) 162/62     Systolic BP Percentile --      Diastolic BP Percentile --      Pulse Rate 01/27/24 1404 67     Resp 01/27/24 1404 18     Temp 01/27/24 1404 98.3 F (36.8 C)     Temp Source 01/27/24 1404 Oral     SpO2 01/27/24 1404 96 %     Weight --      Height --      Head Circumference --      Peak Flow --      Pain Score 01/27/24 1405 5     Pain Loc --      Pain Education --      Exclude from Growth Chart --    No data found.  Updated Vital Signs BP 136/62 (BP Location: Left Arm)   Pulse 67   Temp 98.3 F (36.8 C) (Oral)   Resp 18   SpO2 96%   Visual Acuity Right Eye Distance:   Left Eye Distance:   Bilateral Distance:    Right Eye Near:   Left Eye Near:    Bilateral Near:     Physical Exam Constitutional:      General: She is not in acute distress. HENT:     Right Ear: Tympanic membrane normal.     Left Ear: Tympanic membrane normal.     Nose: Rhinorrhea present.     Mouth/Throat:     Mouth: Mucous membranes are moist.     Pharynx: Oropharynx is clear.  Cardiovascular:     Rate and Rhythm: Normal rate and regular rhythm.     Heart sounds: Normal heart sounds.  Pulmonary:     Effort: Pulmonary effort is normal. No respiratory distress.     Breath sounds: Normal breath sounds.  Neurological:     Mental Status: She is alert.      UC Treatments / Results  Labs (all labs ordered are listed, but only abnormal results are displayed) Labs  Reviewed  POC COVID19/FLU A&B COMBO - Normal    EKG   Radiology No results found.  Procedures Procedures (including critical care time)  Medications Ordered in UC Medications - No data to display  Initial Impression / Assessment and Plan / UC Course  I have reviewed the triage vital signs and the nursing notes.  Pertinent labs & imaging results that were available during my care of the patient were reviewed by me and considered in my medical decision making (see chart for details).    Viral illness.  Rapid COVID and flu negative.  Discussed symptomatic treatment including Tylenol as needed for fever or discomfort.  Treating cough with Tessalon Perles.  Instructed patient to follow-up with her PCP.  ED precautions given.  Patient agrees to plan of care.   Final Clinical Impressions(s) / UC Diagnoses   Final diagnoses:  Viral illness     Discharge Instructions      The COVID and flu tests are negative.   Take Tylenol as needed for fever or discomfort.  Take the [facility] as needed for cough.    Follow-up with your primary care provider.         ED Prescriptions     Medication Sig Dispense Auth. Provider   benzonatate (  TESSALON) 100 MG capsule Take 1 capsule (100 mg total) by mouth 3 (three) times daily as needed for cough. 21 capsule Mickie Bail, NP      PDMP not reviewed this encounter.   Mickie Bail, NP 01/27/24 1431

## 2024-01-27 NOTE — Discharge Instructions (Addendum)
The COVID and flu tests are negative.   Take Tylenol as needed for fever or discomfort.  Take the [facility] as needed for cough.    Follow-up with your primary care provider.

## 2024-01-27 NOTE — ED Triage Notes (Signed)
Pt arrive to UC c/o cold like symptoms since Monday with increase fatigue, chest congestion, HA, chills and running nose.

## 2024-02-04 ENCOUNTER — Other Ambulatory Visit: Payer: Self-pay

## 2024-02-04 MED ORDER — CARVEDILOL 12.5 MG PO TABS: 12.5000 mg | ORAL_TABLET | Freq: Two times a day (BID) | ORAL | 2 refills | Status: DC

## 2024-02-04 NOTE — Telephone Encounter (Signed)
 Last office visit:11/23/23 with plan to f/u 12 months.  next office visit: none/active recall   Requested Prescriptions   Signed Prescriptions Disp Refills   carvedilol (COREG) 12.5 MG tablet 180 tablet 2    Sig: Take 1 tablet (12.5 mg total) by mouth 2 (two) times daily with a meal.    Authorizing Provider: Debbe Odea    Ordering User: Guerry Minors

## 2024-02-07 ENCOUNTER — Encounter: Payer: Self-pay | Admitting: Family

## 2024-02-07 ENCOUNTER — Other Ambulatory Visit: Payer: Self-pay | Admitting: Family

## 2024-02-14 ENCOUNTER — Other Ambulatory Visit (INDEPENDENT_AMBULATORY_CARE_PROVIDER_SITE_OTHER): Payer: Self-pay | Admitting: Vascular Surgery

## 2024-02-14 DIAGNOSIS — Z9889 Other specified postprocedural states: Secondary | ICD-10-CM

## 2024-02-15 ENCOUNTER — Ambulatory Visit (INDEPENDENT_AMBULATORY_CARE_PROVIDER_SITE_OTHER): Payer: Medicare Other

## 2024-02-15 ENCOUNTER — Encounter (INDEPENDENT_AMBULATORY_CARE_PROVIDER_SITE_OTHER): Payer: Self-pay | Admitting: Vascular Surgery

## 2024-02-15 ENCOUNTER — Ambulatory Visit (INDEPENDENT_AMBULATORY_CARE_PROVIDER_SITE_OTHER): Payer: Medicare Other | Admitting: Vascular Surgery

## 2024-02-15 VITALS — BP 143/76 | HR 57 | Resp 18 | Ht 59.0 in | Wt 164.8 lb

## 2024-02-15 DIAGNOSIS — E782 Mixed hyperlipidemia: Secondary | ICD-10-CM

## 2024-02-15 DIAGNOSIS — Z9889 Other specified postprocedural states: Secondary | ICD-10-CM | POA: Diagnosis not present

## 2024-02-15 DIAGNOSIS — I70213 Atherosclerosis of native arteries of extremities with intermittent claudication, bilateral legs: Secondary | ICD-10-CM | POA: Diagnosis not present

## 2024-02-15 DIAGNOSIS — I739 Peripheral vascular disease, unspecified: Secondary | ICD-10-CM

## 2024-02-15 DIAGNOSIS — I10 Essential (primary) hypertension: Secondary | ICD-10-CM

## 2024-02-15 NOTE — Progress Notes (Signed)
 MRN : [id number]  Kristen Powell is a 78 y.o. (02/18/1946) female who presents with chief complaint of  Chief Complaint  Patient presents with   Follow-up    4 weeks (01/31/2024); with ABIs  .  History of Present Illness: Patient returns today in follow up of her peripheral arterial disease.  She has undergone bilateral lower extremity intervention for claudication symptoms.  This has resulted in significant improvement in her claudication symptoms.  She says her legs no longer get tired with walking and she has been able to walk and be active without restriction.  She did have a fall between procedures and had to have a trauma workup due to significant facial bruising.  Fortunately, there is no intracranial hemorrhage.  She has been able to tolerate her aspirin and Plavix therapy.  She is also on Lipitor.  Her ABIs today are 0.96 on the right and 1.00 on the left with multiphasic waveforms and normal digital pressures and waveforms bilaterally.  Current Outpatient Medications  Medication Sig Dispense Refill   acetaminophen (TYLENOL) 500 MG tablet Take 1-2 tablets (500-1,000 mg total) by mouth every 4 (four) hours as needed for fever. 30 tablet 0   amLODipine (NORVASC) 10 MG tablet Take 1 tablet (10 mg total) by mouth daily. 90 tablet 3   aspirin 81 MG tablet Take 81 mg by mouth daily.     atorvastatin (LIPITOR) 40 MG tablet Take 1 tablet (40 mg total) by mouth daily. 90 tablet 3   benzonatate (TESSALON) 100 MG capsule Take 1 capsule (100 mg total) by mouth 3 (three) times daily as needed for cough. 21 capsule 0   calcium carbonate (OS-CAL) 600 MG TABS tablet Take 600 mg by mouth daily as needed.     carvedilol (COREG) 12.5 MG tablet Take 1 tablet (12.5 mg total) by mouth 2 (two) times daily with a meal. 180 tablet 2   Cholecalciferol (VITAMIN D) 2000 UNITS CAPS Take by mouth daily. Patient is taking 3000 units instead of 2000     citalopram (CELEXA) 10 MG tablet Take 1 tablet (10 mg  total) by mouth daily. 90 tablet 3   clopidogrel (PLAVIX) 75 MG tablet Take 1 tablet (75 mg total) by mouth daily. 30 tablet 11   fenofibrate (TRICOR) 145 MG tablet TAKE 1 TABLET BY MOUTH EVERY DAY 90 tablet 3   gabapentin (NEURONTIN) 100 MG capsule Take 1 capsule (100 mg total) by mouth at bedtime. 90 capsule 3   LORazepam (ATIVAN) 0.5 MG tablet Take 0.5-1 tablets (0.25-0.5 mg total) by mouth daily as needed for anxiety. 30 tablet 2   losartan (COZAAR) 100 MG tablet TAKE 1 TABLET (100 MG TOTAL) BY MOUTH DAILY. D/C 50 MG 90 tablet 3   Omega-3 Fatty Acids (FISH OIL) 1200 MG CAPS Take 1 capsule by mouth 2 (two) times daily.     vitamin B-12 (CYANOCOBALAMIN) 1000 MCG tablet Take 1,000 mcg by mouth daily.     No current facility-administered medications for this visit.    Past Medical History:  Diagnosis Date   Anxiety    COVID-19    Diastolic dysfunction    a. 12/2021 Echo: EF 60-65%, no rwma, GrI DD, nl RV fxn, RVSP 36.73mmHg, mild MR.   Hyperlipidemia    Hypertension    Mild Mitral regurgitation    a. 12/2021 Echo: Mild MR.   Psoriasis    younger age   Spinal stenosis    Traumatic rupture of left ear drum  Past Surgical History:  Procedure Laterality Date   ABDOMINAL HYSTERECTOMY  12/14/1978   Partial   BASAL CELL CARCINOMA EXCISION  2006,2008,2011   BUNIONECTOMY     BUNIONECTOMY Bilateral 12/14/1990   COLONOSCOPY WITH PROPOFOL N/A 12/09/2021   Procedure: COLONOSCOPY WITH PROPOFOL;  Surgeon: Midge Minium, MD;  Location[facility] ENDOSCOPY;  Service: Endoscopy;  Laterality: N/A;   KNEE ARTHROSCOPY     KNEE CLOSED REDUCTION  05/07/2015   Procedure: CLOSED MANIPULATION KNEE;  Surgeon: Kennedy Bucker, MD;  Location: [facility] ORS;  Service: Orthopedics;;   KNEE SURGERY Bilateral 2005,2006   Repair   LOWER EXTREMITY ANGIOGRAPHY Right 11/22/2023   Procedure: Lower Extremity Angiography;  Surgeon: Annice Needy, MD;  Location: [facility] INVASIVE CV LAB;  Service: Cardiovascular;  Laterality: Right;    LOWER EXTREMITY ANGIOGRAPHY Left 01/03/2024   Procedure: Lower Extremity Angiography;  Surgeon: Annice Needy, MD;  Location: [facility] INVASIVE CV LAB;  Service: Cardiovascular;  Laterality: Left;   MOHS SURGERY     nose bcc, left cheeck Dr. Meredith Mody f/u Q 12/2020   PERIPHERAL VASCULAR CATHETERIZATION  11/22/2023   REPLACEMENT TOTAL KNEE Left 04/11/2015   manipulation--05/07/2015   ROTATOR CUFF REPAIR W/ DISTAL CLAVICLE EXCISION     SHOULDER SURGERY Right    x2   SKIN CANCER EXCISION     TOTAL KNEE ARTHROPLASTY     VAGINAL HYSTERECTOMY       Social History   Tobacco Use   Smoking status: Never   Smokeless tobacco: Never  Vaping Use   Vaping status: Never Used  Substance Use Topics   Alcohol use: No   Drug use: No      Family History  Problem Relation Age of Onset   Cancer Mother        lung   Hypertension Sister    Heart attack Sister 60   Breast cancer Paternal Aunt      Allergies  Allergen Reactions   Hydrochlorothiazide Other (See Comments)    Hyponatremia    Adhesive [Tape] Rash    redness     REVIEW OF SYSTEMS (Negative unless checked)   Constitutional: [] Weight loss  [] Fever  [] Chills Cardiac: [] Chest pain   [] Chest pressure   [] Palpitations   [] Shortness of breath when laying flat   [] Shortness of breath at rest   [] Shortness of breath with exertion. Vascular:  [x] Pain in legs with walking   [] Pain in legs at rest   [] Pain in legs when laying flat   [x] Claudication   [] Pain in feet when walking  [] Pain in feet at rest  [] Pain in feet when laying flat   [] History of DVT   [] Phlebitis   [] Swelling in legs   [] Varicose veins   [] Non-healing ulcers Pulmonary:   [] Uses home oxygen   [] Productive cough   [] Hemoptysis   [] Wheeze  [] COPD   [] Asthma Neurologic:  [] Dizziness  [] Blackouts   [] Seizures   [] History of stroke   [] History of TIA  [] Aphasia   [] Temporary blindness   [] Dysphagia   [] Weakness or numbness in arms   [] Weakness or numbness in legs Musculoskeletal:   [x] Arthritis   [] Joint swelling   [x] Joint pain   [] Low back pain Hematologic:  [] Easy bruising  [] Easy bleeding   [] Hypercoagulable state   [] Anemic   Gastrointestinal:  [] Blood in stool   [] Vomiting blood  [] Gastroesophageal reflux/heartburn   [] Abdominal pain Genitourinary:  [] Chronic kidney disease   [] Difficult urination  [] Frequent urination  [] Burning with urination   [] Hematuria  Skin:  [] Rashes   [] Ulcers   [] Wounds Psychological:  [] History of anxiety   []  History of major depression.   Physical Examination  BP (!) 143/76   Pulse (!) 57   Resp 18   Ht 4\' 11"  (1.499 m)   Wt 164 lb 12.8 oz (74.8 kg)   BMI 33.29 kg/m  Gen:  WD/WN, NAD Head: [place]/AT, No temporalis wasting. Ear/Nose/Throat: Hearing grossly intact, nares w/o erythema or drainage Eyes: Conjunctiva clear. Sclera non-icteric Neck: Supple.  Trachea midline Pulmonary:  Good air movement, no use of accessory muscles.  Cardiac: RRR, no JVD Vascular:  Vessel Right Left  Radial Palpable Palpable                          PT Palpable Palpable  DP Palpable Palpable   Gastrointestinal: soft, non-tender/non-distended. No guarding/reflex.  Musculoskeletal: M/S 5/5 throughout.  No deformity or atrophy. Trace RLE edema, trace to 1+ LLE edema. Neurologic: Sensation grossly intact in extremities.  Symmetrical.  Speech is fluent.  Psychiatric: Judgment intact, Mood & affect appropriate for pt's clinical situation. Dermatologic: No rashes or ulcers noted.  No cellulitis or open wounds.      Labs Recent Results (from the past 2160 hours)  Urinalysis, Routine w reflex microscopic     Status: Abnormal   Collection Time: 11/18/23 10:05 AM  Result Value Ref Range   Color, Urine YELLOW Yellow;Lt. Yellow;Straw;Dark Yellow;Amber;Green;Red;Brown   APPearance Sl Cloudy (A) Clear;Turbid;Slightly Cloudy;Cloudy   Specific Gravity, Urine 1.010 1.000 - 1.030   pH 7.0 5.0 - 8.0   Total Protein, Urine NEGATIVE Negative   Urine  Glucose NEGATIVE Negative   Ketones, ur NEGATIVE Negative   Bilirubin Urine NEGATIVE Negative   Hgb urine dipstick NEGATIVE Negative   Urobilinogen, UA 0.2 0.0 - 1.0   Leukocytes,Ua MODERATE (A) Negative   Nitrite POSITIVE (A) Negative   WBC, UA 7-10/hpf (A) 0-2/hpf   RBC / HPF 0-2/hpf 0-2/hpf   Squamous Epithelial / HPF Rare(0-4/hpf) Rare(0-4/hpf)   Bacteria, UA Many(>50/hpf) (A) None  CBC with Differential/Platelet     Status: Abnormal   Collection Time: 11/18/23 10:05 AM  Result Value Ref Range   WBC 6.3 4.0 - 10.5 K/uL   RBC 3.34 (L) 3.87 - 5.11 Mil/uL   Hemoglobin 11.4 (L) 12.0 - 15.0 g/dL   HCT 16.1 (L) 09.6 - 04.5 %   MCV 98.7 78.0 - 100.0 fl   MCHC 34.6 30.0 - 36.0 g/dL   RDW 40.9 81.1 - 91.4 %   Platelets 269.0 150.0 - 400.0 K/uL   Neutrophils Relative % 60.2 43.0 - 77.0 %   Lymphocytes Relative 25.4 12.0 - 46.0 %   Monocytes Relative 11.7 3.0 - 12.0 %   Eosinophils Relative 2.5 0.0 - 5.0 %   Basophils Relative 0.2 0.0 - 3.0 %   Neutro Abs 3.8 1.4 - 7.7 K/uL   Lymphs Abs 1.6 0.7 - 4.0 K/uL   Monocytes Absolute 0.7 0.1 - 1.0 K/uL   Eosinophils Absolute 0.2 0.0 - 0.7 K/uL   Basophils Absolute 0.0 0.0 - 0.1 K/uL  Urine Culture     Status: Abnormal   Collection Time: 11/19/23  2:58 PM   Specimen: Urine  Result Value Ref Range   MICRO NUMBER: [id number]    SPECIMEN QUALITY: Adequate    Sample Source URINE    STATUS: FINAL    ISOLATE 1: Escherichia coli (A)     Comment: Greater than 100,000  CFU/mL of Escherichia coli      Susceptibility   Escherichia coli - URINE CULTURE, REFLEX    AMOX/CLAVULANIC 4 Sensitive     AMPICILLIN 4 Sensitive     AMPICILLIN/SULBACTAM <=2 Sensitive     CEFAZOLIN* <=4 Not Reportable      * For infections other than uncomplicated UTI caused by E. coli, K. pneumoniae or P. mirabilis: Cefazolin is resistant if MIC > or = 8 mcg/mL. (Distinguishing susceptible versus intermediate for isolates with MIC < or = 4 mcg/mL requires additional  testing.) For uncomplicated UTI caused by E. coli, K. pneumoniae or P. mirabilis: Cefazolin is susceptible if MIC <32 mcg/mL and predicts susceptible to the oral agents cefaclor, cefdinir, cefpodoxime, cefprozil, cefuroxime, cephalexin and loracarbef.     CEFTAZIDIME <=1 Sensitive     CEFEPIME <=1 Sensitive     CEFTRIAXONE <=1 Sensitive     CIPROFLOXACIN <=0.25 Sensitive     LEVOFLOXACIN <=0.12 Sensitive     GENTAMICIN <=1 Sensitive     IMIPENEM <=0.25 Sensitive     NITROFURANTOIN 32 Sensitive     PIP/TAZO <=4 Sensitive     TOBRAMYCIN <=1 Sensitive     TRIMETH/SULFA* >=320 Resistant      * For infections other than uncomplicated UTI caused by E. coli, K. pneumoniae or P. mirabilis: Cefazolin is resistant if MIC > or = 8 mcg/mL. (Distinguishing susceptible versus intermediate for isolates with MIC < or = 4 mcg/mL requires additional testing.) For uncomplicated UTI caused by E. coli, K. pneumoniae or P. mirabilis: Cefazolin is susceptible if MIC <32 mcg/mL and predicts susceptible to the oral agents cefaclor, cefdinir, cefpodoxime, cefprozil, cefuroxime, cephalexin and loracarbef. Legend: S = Susceptible  I = Intermediate R = Resistant  NS = Not susceptible SDD = Susceptible Dose Dependent * = Not Tested  NR = Not Reported **NN = See Therapy Comments   BUN     Status: Abnormal   Collection Time: 11/22/23  9:56 AM  Result Value Ref Range   BUN 25 (H) 8 - 23 mg/dL    Comment: Performed at [facility], [address] [place], [place] [address]  Creatinine, serum     Status: Abnormal   Collection Time: 11/22/23  9:56 AM  Result Value Ref Range   Creatinine, Ser 1.03 (H) 0.44 - 1.00 mg/dL   GFR, Estimated 56 (L) >60 mL/min    Comment: (NOTE) Calculated using the CKD-EPI Creatinine Equation (2021) Performed at [facility], [address] [place], [place] [address]   Urinalysis, Routine w reflex microscopic     Status: Abnormal   Collection Time:  12/21/23 10:38 AM  Result Value Ref Range   Color, Urine YELLOW Yellow;Lt. Yellow;Straw;Dark Yellow;Amber;Green;Red;Brown   APPearance Cloudy (A) Clear;Turbid;Slightly Cloudy;Cloudy   Specific Gravity, Urine 1.015 1.000 - 1.030   pH 6.0 5.0 - 8.0   Total Protein, Urine NEGATIVE Negative   Urine Glucose NEGATIVE Negative   Ketones, ur NEGATIVE Negative   Bilirubin Urine NEGATIVE Negative   Hgb urine dipstick NEGATIVE Negative   Urobilinogen, UA 0.2 0.0 - 1.0   Leukocytes,Ua SMALL (A) Negative   Nitrite POSITIVE (A) Negative   WBC, UA 21-50/hpf (A) 0-2/hpf   RBC / HPF none seen 0-2/hpf   Squamous Epithelial / HPF Rare(0-4/hpf) Rare(0-4/hpf)   Bacteria, UA Many(>50/hpf) (A) None  B12 and Folate Panel     Status: None   Collection Time: 12/21/23 10:38 AM  Result Value Ref Range   Vitamin B-12 561 211 -  911 pg/mL   Folate 8.9 >5.9 ng/mL  IBC + Ferritin     Status: None   Collection Time: 12/21/23 10:38 AM  Result Value Ref Range   Iron 104 42 - 145 ug/dL   Transferrin 161.0 960.4 - 360.0 mg/dL   Saturation Ratios 54.0 20.0 - 50.0 %   Ferritin 47.5 10.0 - 291.0 ng/mL   TIBC 441.0 250.0 - 450.0 mcg/dL  Urine Culture     Status: Abnormal   Collection Time: 12/22/23  3:32 PM   Specimen: Urine  Result Value Ref Range   MICRO NUMBER: [id number]    SPECIMEN QUALITY: Adequate    Sample Source URINE    STATUS: FINAL    ISOLATE 1: Escherichia coli (A)     Comment: Greater than 100,000 CFU/mL of Escherichia coli      Susceptibility   Escherichia coli - URINE CULTURE, REFLEX    AMOX/CLAVULANIC 16 Intermediate     AMPICILLIN >=32 Resistant     AMPICILLIN/SULBACTAM >=32 Resistant     CEFAZOLIN* 8 Resistant      * For uncomplicated UTI caused by E. coli, K. pneumoniae or P. mirabilis: Cefazolin is susceptible if MIC <32 mcg/mL and predicts susceptible to the oral agents cefaclor, cefdinir, cefpodoxime, cefprozil, cefuroxime, cephalexin and loracarbef.     CEFTAZIDIME <=1 Sensitive      CEFEPIME <=1 Sensitive     CEFTRIAXONE <=1 Sensitive     CIPROFLOXACIN <=0.25 Sensitive     LEVOFLOXACIN <=0.12 Sensitive     GENTAMICIN >=16 Resistant     IMIPENEM <=0.25 Sensitive     NITROFURANTOIN <=16 Sensitive     PIP/TAZO 64 Intermediate     TOBRAMYCIN 4 Sensitive     TRIMETH/SULFA* >=320 Resistant      * For uncomplicated UTI caused by E. coli, K. pneumoniae or P. mirabilis: Cefazolin is susceptible if MIC <32 mcg/mL and predicts susceptible to the oral agents cefaclor, cefdinir, cefpodoxime, cefprozil, cefuroxime, cephalexin and loracarbef. Legend: S = Susceptible  I = Intermediate R = Resistant  NS = Not susceptible SDD = Susceptible Dose Dependent * = Not Tested  NR = Not Reported **NN = See Therapy Comments   VAS [place] ABI WITH/WO TBI     Status: None   Collection Time: 12/28/23 10:30 AM  Result Value Ref Range   Right ABI 1.07    Left ABI 1.01   BUN     Status: None   Collection Time: 01/03/24  9:30 AM  Result Value Ref Range   BUN 23 8 - 23 mg/dL    Comment: Performed at [facility], [address] [place], [place] [address]  Creatinine, serum     Status: None   Collection Time: 01/03/24  9:30 AM  Result Value Ref Range   Creatinine, Ser 0.93 0.44 - 1.00 mg/dL   GFR, Estimated >21 >30 mL/min    Comment: (NOTE) Calculated using the CKD-EPI Creatinine Equation (2021) Performed at [facility], [address] [place], [place] [address]   POC Covid19/Flu A&B Antigen     Status: Normal   Collection Time: 01/27/24  2:28 PM  Result Value Ref Range   Influenza A Antigen, POC Negative    Influenza B Antigen, POC Negative    Covid Antigen, POC Negative     Radiology No results found.  Assessment/Plan  Atherosclerosis of native arteries of extremity with intermittent claudication (HCC) Her ABIs today are 0.96 on the right and 1.00 on the left with multiphasic  waveforms and normal digital pressures and waveforms bilaterally.  Clinically,  she is significantly improved.  Continue dual antiplatelet therapy and statin agent.  Recheck in 3 months with noninvasive studies.  Essential hypertension blood pressure control important in reducing the progression of atherosclerotic disease. On appropriate oral medications.     HLD (hyperlipidemia) lipid control important in reducing the progression of atherosclerotic disease. On Fish oil and Tricor  Festus Barren, MD  02/15/2024 12:05 PM    This note was created with Dragon medical transcription system.  Any errors from dictation are purely unintentional

## 2024-02-15 NOTE — Assessment & Plan Note (Signed)
 Her ABIs today are 0.96 on the right and 1.00 on the left with multiphasic waveforms and normal digital pressures and waveforms bilaterally.  Clinically, she is significantly improved.  Continue dual antiplatelet therapy and statin agent.  Recheck in 3 months with noninvasive studies.

## 2024-02-18 LAB — VAS US ABI WITH/WO TBI
Left ABI: 1
Right ABI: 0.96

## 2024-02-22 ENCOUNTER — Telehealth: Payer: Self-pay

## 2024-02-22 NOTE — Telephone Encounter (Signed)
 Called Patient and went over unread my chart message with her. Patient understands and is agreeable to stop the Omeprazole and start Pepcid AC 20 mg in the evening if acid reflux symptoms come back or persist.

## 2024-02-26 ENCOUNTER — Other Ambulatory Visit: Payer: Self-pay | Admitting: Family

## 2024-02-26 DIAGNOSIS — F419 Anxiety disorder, unspecified: Secondary | ICD-10-CM

## 2024-03-21 ENCOUNTER — Encounter: Payer: Self-pay | Admitting: Family

## 2024-03-21 ENCOUNTER — Ambulatory Visit (INDEPENDENT_AMBULATORY_CARE_PROVIDER_SITE_OTHER): Payer: Medicare Other | Admitting: Family

## 2024-03-21 VITALS — BP 136/76 | HR 67 | Temp 96.0°F | Ht 59.0 in | Wt 162.4 lb

## 2024-03-21 DIAGNOSIS — R7303 Prediabetes: Secondary | ICD-10-CM

## 2024-03-21 DIAGNOSIS — F411 Generalized anxiety disorder: Secondary | ICD-10-CM | POA: Diagnosis not present

## 2024-03-21 DIAGNOSIS — I70213 Atherosclerosis of native arteries of extremities with intermittent claudication, bilateral legs: Secondary | ICD-10-CM | POA: Diagnosis not present

## 2024-03-21 DIAGNOSIS — I10 Essential (primary) hypertension: Secondary | ICD-10-CM | POA: Diagnosis not present

## 2024-03-21 DIAGNOSIS — F419 Anxiety disorder, unspecified: Secondary | ICD-10-CM

## 2024-03-21 MED ORDER — LORAZEPAM 0.5 MG PO TABS: 0.2500 mg | ORAL_TABLET | Freq: Every day | ORAL | 2 refills | Status: AC | PRN

## 2024-03-21 NOTE — Assessment & Plan Note (Signed)
Chronic, stable.  Continue Celexa 10 mg daily, lorazepam 0.5 mg daily prn

## 2024-03-21 NOTE — Assessment & Plan Note (Signed)
 Chronic, symptomatically improved.  Patient exercising more regularly.  Continue plavix 75mg ; advised to resume lipitor 40mg  every day.

## 2024-03-21 NOTE — Assessment & Plan Note (Signed)
Chronic, stable.continue carvedilol 12.5mg  twice daily, amlodipine 10 mg daily, losartan 100 mg daily

## 2024-03-21 NOTE — Progress Notes (Signed)
 Assessment & Plan:  Hypertension, unspecified type  Anxiety -     LORazepam; Take 0.5-1 tablets (0.25-0.5 mg total) by mouth daily as needed for anxiety.  Dispense: 30 tablet; Refill: 2  Prediabetes  Essential hypertension Assessment & Plan: Chronic, stable.continue carvedilol 12.5mg  twice daily, amlodipine 10 mg daily, losartan 100 mg daily   Atherosclerosis of native artery of both lower extremities with intermittent claudication (HCC) Assessment & Plan: Chronic, symptomatically improved.  Patient exercising more regularly.  Continue plavix 75mg ; advised to resume lipitor 40mg  every day.    Generalized anxiety disorder Assessment & Plan: Chronic, stable.  Continue Celexa 10 mg daily, lorazepam 0.5 mg daily prn.       Return precautions given.   Risks, benefits, and alternatives of the medications and treatment plan prescribed today were discussed, and patient expressed understanding.   Education regarding symptom management and diagnosis given to patient on AVS either electronically or printed.  No follow-ups on file.  Rennie Plowman, FNP  Subjective:    Patient ID: Kristen Powell, female    DOB: [birth year], 78 y.o.   MRN: [id number]  CC: Kristen Powell is a 78 y.o. female who presents today for follow up.   HPI: Feels well today  Energy has improved. She walking in her yard and at church more often. No recent falls.   Denies CP, SOB.   Leg pain resolved after revascularization with Dr Wyn Quaker 11/22/23, 01/03/24.  Follow up is scheduled with Dr Wyn Quaker 05/2024.      She is compliant with plavix , asa 81mg .   She had stopped lipitor for unclear reason.   She requests a refill of ativan for occasional use.   Allergies: Hydrochlorothiazide and Adhesive [tape] Current Outpatient Medications on File Prior to Visit  Medication Sig Dispense Refill   acetaminophen (TYLENOL) 500 MG tablet Take 1-2 tablets (500-1,000 mg total) by mouth every 4 (four) hours as  needed for fever. 30 tablet 0   amLODipine (NORVASC) 10 MG tablet Take 1 tablet (10 mg total) by mouth daily. 90 tablet 3   aspirin 81 MG tablet Take 81 mg by mouth daily.     benzonatate (TESSALON) 100 MG capsule Take 1 capsule (100 mg total) by mouth 3 (three) times daily as needed for cough. 21 capsule 0   calcium carbonate (OS-CAL) 600 MG TABS tablet Take 600 mg by mouth daily as needed.     carvedilol (COREG) 12.5 MG tablet Take 1 tablet (12.5 mg total) by mouth 2 (two) times daily with a meal. 180 tablet 2   Cholecalciferol (VITAMIN D) 2000 UNITS CAPS Take by mouth daily. Patient is taking 3000 units instead of 2000     citalopram (CELEXA) 10 MG tablet TAKE 1 TABLET BY MOUTH EVERY DAY 90 tablet 3   clopidogrel (PLAVIX) 75 MG tablet Take 1 tablet (75 mg total) by mouth daily. 30 tablet 11   fenofibrate (TRICOR) 145 MG tablet TAKE 1 TABLET BY MOUTH EVERY DAY 90 tablet 3   gabapentin (NEURONTIN) 100 MG capsule Take 1 capsule (100 mg total) by mouth at bedtime. 90 capsule 3   losartan (COZAAR) 100 MG tablet TAKE 1 TABLET (100 MG TOTAL) BY MOUTH DAILY. D/C 50 MG 90 tablet 3   Omega-3 Fatty Acids (FISH OIL) 1200 MG CAPS Take 1 capsule by mouth 2 (two) times daily.     vitamin B-12 (CYANOCOBALAMIN) 1000 MCG tablet Take 1,000 mcg by mouth daily.     atorvastatin (LIPITOR) 40 MG  tablet Take 1 tablet (40 mg total) by mouth daily. (Patient not taking: Reported on 03/21/2024) 90 tablet 3   No current facility-administered medications on file prior to visit.    Review of Systems  Constitutional:  Negative for chills, fatigue and fever.  Respiratory:  Negative for cough.   Cardiovascular:  Negative for chest pain and palpitations.  Gastrointestinal:  Negative for nausea and vomiting.      Objective:    BP 136/76   Pulse 67   Temp (!) 96 F (35.6 C) (Oral)   Ht 4\' 11"  (1.499 m)   Wt 162 lb 6.4 oz (73.7 kg)   SpO2 97%   BMI 32.80 kg/m  BP Readings from Last 3 Encounters:  03/21/24 136/76   02/15/24 (!) 143/76  01/27/24 136/62   Wt Readings from Last 3 Encounters:  03/21/24 162 lb 6.4 oz (73.7 kg)  02/15/24 164 lb 12.8 oz (74.8 kg)  01/03/24 167 lb (75.8 kg)    Physical Exam Vitals reviewed.  Constitutional:      Appearance: She is well-developed.  Eyes:     Conjunctiva/sclera: Conjunctivae normal.  Cardiovascular:     Rate and Rhythm: Normal rate and regular rhythm.     Pulses: Normal pulses.     Heart sounds: Normal heart sounds.  Pulmonary:     Effort: Pulmonary effort is normal.     Breath sounds: Normal breath sounds. No wheezing, rhonchi or rales.  Skin:    General: Skin is warm and dry.  Neurological:     Mental Status: She is alert.  Psychiatric:        Speech: Speech normal.        Behavior: Behavior normal.        Thought Content: Thought content normal.

## 2024-03-21 NOTE — Patient Instructions (Signed)
 Restart lipitor 40mg  as prescribed Dr Sandie Ano, cardiology.

## 2024-03-28 ENCOUNTER — Encounter (INDEPENDENT_AMBULATORY_CARE_PROVIDER_SITE_OTHER): Payer: Medicare Other

## 2024-03-28 ENCOUNTER — Ambulatory Visit (INDEPENDENT_AMBULATORY_CARE_PROVIDER_SITE_OTHER): Payer: Medicare Other | Admitting: Vascular Surgery

## 2024-04-04 DIAGNOSIS — Z85828 Personal history of other malignant neoplasm of skin: Secondary | ICD-10-CM | POA: Diagnosis not present

## 2024-04-04 DIAGNOSIS — Z08 Encounter for follow-up examination after completed treatment for malignant neoplasm: Secondary | ICD-10-CM | POA: Diagnosis not present

## 2024-04-04 DIAGNOSIS — L821 Other seborrheic keratosis: Secondary | ICD-10-CM | POA: Diagnosis not present

## 2024-04-04 DIAGNOSIS — L814 Other melanin hyperpigmentation: Secondary | ICD-10-CM | POA: Diagnosis not present

## 2024-04-04 DIAGNOSIS — L815 Leukoderma, not elsewhere classified: Secondary | ICD-10-CM | POA: Diagnosis not present

## 2024-04-04 DIAGNOSIS — D2371 Other benign neoplasm of skin of right lower limb, including hip: Secondary | ICD-10-CM | POA: Diagnosis not present

## 2024-04-04 DIAGNOSIS — L608 Other nail disorders: Secondary | ICD-10-CM | POA: Diagnosis not present

## 2024-05-02 ENCOUNTER — Encounter (INDEPENDENT_AMBULATORY_CARE_PROVIDER_SITE_OTHER): Payer: Self-pay

## 2024-05-12 ENCOUNTER — Other Ambulatory Visit: Payer: Self-pay | Admitting: Family

## 2024-05-12 DIAGNOSIS — I10 Essential (primary) hypertension: Secondary | ICD-10-CM

## 2024-05-16 ENCOUNTER — Ambulatory Visit (INDEPENDENT_AMBULATORY_CARE_PROVIDER_SITE_OTHER)

## 2024-05-16 ENCOUNTER — Encounter (INDEPENDENT_AMBULATORY_CARE_PROVIDER_SITE_OTHER): Payer: Self-pay | Admitting: Vascular Surgery

## 2024-05-16 ENCOUNTER — Ambulatory Visit (INDEPENDENT_AMBULATORY_CARE_PROVIDER_SITE_OTHER): Admitting: Vascular Surgery

## 2024-05-16 VITALS — BP 153/71 | HR 69 | Resp 16 | Wt 166.8 lb

## 2024-05-16 DIAGNOSIS — E782 Mixed hyperlipidemia: Secondary | ICD-10-CM

## 2024-05-16 DIAGNOSIS — I10 Essential (primary) hypertension: Secondary | ICD-10-CM | POA: Diagnosis not present

## 2024-05-16 DIAGNOSIS — I70213 Atherosclerosis of native arteries of extremities with intermittent claudication, bilateral legs: Secondary | ICD-10-CM

## 2024-05-16 NOTE — Assessment & Plan Note (Signed)
 ABIs today are 1.01 on the right and 1.09 on the left with triphasic waveforms and normal digital pressures bilaterally. Improved symptoms after revascularization.  Continue current medical regimen.  Follow-up in 6 months with noninvasive studies.

## 2024-05-16 NOTE — Progress Notes (Signed)
 MRN : [id number]  Kristen Powell is a 78 y.o. ([birth year]) female who presents with chief complaint of  Chief Complaint  Patient presents with   Follow-up    3-4 abi follow up  .  History of Present Illness: Patient returns today in follow up of her PAD.  She underwent bilateral lower extremity revascularizations in a staged fashion about 6 months ago.  She is doing well.  She notices much less claudication other issues with her legs while walking.  She does still get cramps in her legs particularly at night.  These are not related to activity.  Tolerating Plavix  and her other medications without issue.  ABIs today are 1.01 on the right and 1.09 on the left with triphasic waveforms and normal digital pressures bilaterally.  Current Outpatient Medications  Medication Sig Dispense Refill   acetaminophen  (TYLENOL ) 500 MG tablet Take 1-2 tablets (500-1,000 mg total) by mouth every 4 (four) hours as needed for fever. 30 tablet 0   amLODipine  (NORVASC ) 10 MG tablet Take 1 tablet (10 mg total) by mouth daily. 90 tablet 3   aspirin  81 MG tablet Take 81 mg by mouth daily.     benzonatate  (TESSALON ) 100 MG capsule Take 1 capsule (100 mg total) by mouth 3 (three) times daily as needed for cough. 21 capsule 0   calcium  carbonate (OS-CAL) 600 MG TABS tablet Take 600 mg by mouth daily as needed.     carvedilol  (COREG ) 12.5 MG tablet Take 1 tablet (12.5 mg total) by mouth 2 (two) times daily with a meal. 180 tablet 2   Cholecalciferol  (VITAMIN D ) 2000 UNITS CAPS Take by mouth daily. Patient is taking 3000 units instead of 2000     citalopram  (CELEXA ) 10 MG tablet TAKE 1 TABLET BY MOUTH EVERY DAY 90 tablet 3   clopidogrel  (PLAVIX ) 75 MG tablet Take 1 tablet (75 mg total) by mouth daily. 30 tablet 11   fenofibrate  (TRICOR ) 145 MG tablet TAKE 1 TABLET BY MOUTH EVERY DAY 90 tablet 3   gabapentin  (NEURONTIN ) 100 MG capsule Take 1 capsule (100 mg total) by mouth at bedtime. 90 capsule 3   LORazepam   (ATIVAN ) 0.5 MG tablet Take 0.5-1 tablets (0.25-0.5 mg total) by mouth daily as needed for anxiety. 30 tablet 2   losartan  (COZAAR ) 100 MG tablet TAKE 1 TABLET (100 MG TOTAL) BY MOUTH DAILY. D/C 50 MG 90 tablet 1   Omega-3 Fatty Acids (FISH OIL) 1200 MG CAPS Take 1 capsule by mouth 2 (two) times daily.     vitamin B-12 (CYANOCOBALAMIN ) 1000 MCG tablet Take 1,000 mcg by mouth daily.     atorvastatin  (LIPITOR) 40 MG tablet Take 1 tablet (40 mg total) by mouth daily. (Patient not taking: Reported on 05/16/2024) 90 tablet 3   No current facility-administered medications for this visit.    Past Medical History:  Diagnosis Date   Anxiety    COVID-19    Diastolic dysfunction    a. 12/2021 Echo: EF 60-65%, no rwma, GrI DD, nl RV fxn, RVSP 36.31mmHg, mild MR.   Hyperlipidemia    Hypertension    Mild Mitral regurgitation    a. 12/2021 Echo: Mild MR.   Psoriasis    younger age   Spinal stenosis    Traumatic rupture of left ear drum     Past Surgical History:  Procedure Laterality Date   ABDOMINAL HYSTERECTOMY  12/14/1978   Partial   BASAL CELL CARCINOMA EXCISION  2006,2008,2011   BUNIONECTOMY  BUNIONECTOMY Bilateral 12/14/1990   COLONOSCOPY WITH PROPOFOL  N/A 12/09/2021   Procedure: COLONOSCOPY WITH PROPOFOL ;  Surgeon: Marnee Sink, MD;  Location: [facility] ENDOSCOPY;  Service: Endoscopy;  Laterality: N/A;   KNEE ARTHROSCOPY     KNEE CLOSED REDUCTION  05/07/2015   Procedure: CLOSED MANIPULATION KNEE;  Surgeon: Molli Angelucci, MD;  Location: [facility] ORS;  Service: Orthopedics;;   KNEE SURGERY Bilateral 2005,2006   Repair   LOWER EXTREMITY ANGIOGRAPHY Right 11/22/2023   Procedure: Lower Extremity Angiography;  Surgeon: Celso College, MD;  Location: [facility] INVASIVE CV LAB;  Service: Cardiovascular;  Laterality: Right;   LOWER EXTREMITY ANGIOGRAPHY Left 01/03/2024   Procedure: Lower Extremity Angiography;  Surgeon: Celso College, MD;  Location: [facility] INVASIVE CV LAB;  Service: Cardiovascular;  Laterality:  Left;   MOHS SURGERY     nose bcc, left cheeck Dr. Vallie Gay f/u Q 12/2020   PERIPHERAL VASCULAR CATHETERIZATION  11/22/2023   REPLACEMENT TOTAL KNEE Left 04/11/2015   manipulation--05/07/2015   ROTATOR CUFF REPAIR W/ DISTAL CLAVICLE EXCISION     SHOULDER SURGERY Right    x2   SKIN CANCER EXCISION     TOTAL KNEE ARTHROPLASTY     VAGINAL HYSTERECTOMY       Social History   Tobacco Use   Smoking status: Never   Smokeless tobacco: Never  Vaping Use   Vaping status: Never Used  Substance Use Topics   Alcohol use: No   Drug use: No      Family History  Problem Relation Age of Onset   Cancer Mother        lung   Hypertension Sister    Heart attack Sister 11   Breast cancer Paternal Aunt      Allergies  Allergen Reactions   Hydrochlorothiazide  Other (See Comments)    Hyponatremia    Adhesive [Tape] Rash    redness     REVIEW OF SYSTEMS (Negative unless checked)   Constitutional: [] Weight loss  [] Fever  [] Chills Cardiac: [] Chest pain   [] Chest pressure   [] Palpitations   [] Shortness of breath when laying flat   [] Shortness of breath at rest   [] Shortness of breath with exertion. Vascular:  [x] Pain in legs with walking   [] Pain in legs at rest   [] Pain in legs when laying flat   [x] Claudication   [] Pain in feet when walking  [] Pain in feet at rest  [] Pain in feet when laying flat   [] History of DVT   [] Phlebitis   [] Swelling in legs   [] Varicose veins   [] Non-healing ulcers Pulmonary:   [] Uses home oxygen   [] Productive cough   [] Hemoptysis   [] Wheeze  [] COPD   [] Asthma Neurologic:  [] Dizziness  [] Blackouts   [] Seizures   [] History of stroke   [] History of TIA  [] Aphasia   [] Temporary blindness   [] Dysphagia   [] Weakness or numbness in arms   [] Weakness or numbness in legs Musculoskeletal:  [x] Arthritis   [] Joint swelling   [x] Joint pain   [] Low back pain Hematologic:  [] Easy bruising  [] Easy bleeding   [] Hypercoagulable state   [] Anemic   Gastrointestinal:  [] Blood in stool    [] Vomiting blood  [] Gastroesophageal reflux/heartburn   [] Abdominal pain Genitourinary:  [] Chronic kidney disease   [] Difficult urination  [] Frequent urination  [] Burning with urination   [] Hematuria Skin:  [] Rashes   [] Ulcers   [] Wounds Psychological:  [] History of anxiety   []  History of major depression.  Physical Examination  BP (!) 153/71   Pulse  69   Resp 16   Wt 166 lb 12.8 oz (75.7 kg)   BMI 33.69 kg/m  Gen:  WD/WN, NAD. Appears younger than stated age. Head: [place]/AT, No temporalis wasting. Ear/Nose/Throat: Hearing grossly intact, nares w/o erythema or drainage Eyes: Conjunctiva clear. Sclera non-icteric Neck: Supple.  Trachea midline Pulmonary:  Good air movement, no use of accessory muscles.  Cardiac: RRR, no JVD Vascular:  Vessel Right Left  Radial Palpable Palpable                          PT Palpable Palpable  DP Palpable Palpable   Gastrointestinal: soft, non-tender/non-distended. No guarding/reflex.  Musculoskeletal: M/S 5/5 throughout.  No deformity or atrophy. No edema. Neurologic: Sensation grossly intact in extremities.  Symmetrical.  Speech is fluent.  Psychiatric: Judgment intact, Mood & affect appropriate for pt's clinical situation. Dermatologic: No rashes or ulcers noted.  No cellulitis or open wounds.      Labs No results found for this or any previous visit (from the past 2160 hours).  Radiology No results found.  Assessment/Plan  Atherosclerosis of native arteries of extremity with intermittent claudication (HCC) ABIs today are 1.01 on the right and 1.09 on the left with triphasic waveforms and normal digital pressures bilaterally. Improved symptoms after revascularization.  Continue current medical regimen.  Follow-up in 6 months with noninvasive studies.  Essential hypertension blood pressure control important in reducing the progression of atherosclerotic disease. On appropriate oral medications.     HLD (hyperlipidemia) lipid  control important in reducing the progression of atherosclerotic disease. On Fish oil and Tricor   Mikki Alexander, MD  05/16/2024 10:23 AM    This note was created with Dragon medical transcription system.  Any errors from dictation are purely unintentional

## 2024-05-18 LAB — VAS US ABI WITH/WO TBI
Left ABI: 1.09
Right ABI: 1.01

## 2024-06-01 ENCOUNTER — Other Ambulatory Visit: Payer: Self-pay | Admitting: Family

## 2024-06-01 ENCOUNTER — Other Ambulatory Visit: Payer: Self-pay | Admitting: Cardiology

## 2024-06-01 DIAGNOSIS — E782 Mixed hyperlipidemia: Secondary | ICD-10-CM

## 2024-07-28 ENCOUNTER — Other Ambulatory Visit: Payer: Self-pay | Admitting: Family

## 2024-07-28 DIAGNOSIS — K219 Gastro-esophageal reflux disease without esophagitis: Secondary | ICD-10-CM

## 2024-08-22 DIAGNOSIS — Z23 Encounter for immunization: Secondary | ICD-10-CM | POA: Diagnosis not present

## 2024-08-27 ENCOUNTER — Other Ambulatory Visit: Payer: Self-pay | Admitting: Family

## 2024-08-27 DIAGNOSIS — G629 Polyneuropathy, unspecified: Secondary | ICD-10-CM

## 2024-08-27 DIAGNOSIS — M19071 Primary osteoarthritis, right ankle and foot: Secondary | ICD-10-CM

## 2024-08-29 ENCOUNTER — Ambulatory Visit (INDEPENDENT_AMBULATORY_CARE_PROVIDER_SITE_OTHER): Payer: Medicare Other | Admitting: *Deleted

## 2024-08-29 VITALS — Ht 59.0 in | Wt 161.0 lb

## 2024-08-29 DIAGNOSIS — Z0000 Encounter for general adult medical examination without abnormal findings: Secondary | ICD-10-CM | POA: Diagnosis not present

## 2024-08-29 NOTE — Progress Notes (Signed)
 Subjective:   Kristen Powell is a 78 y.o. who presents for a Medicare Wellness preventive visit.  As a reminder, Annual Wellness Visits don't include a physical exam, and some assessments may be limited, especially if this visit is performed virtually. We may recommend an in-person follow-up visit with your provider if needed.  Visit Complete: Virtual I connected with  Kristen Powell on 08/29/24 by a audio enabled telemedicine application and verified that I am speaking with the correct person using two identifiers.  Patient Location: Home  Provider Location: Home Office  I discussed the limitations of evaluation and management by telemedicine. The patient expressed understanding and agreed to proceed.  Vital Signs: Because this visit was a virtual/telehealth visit, some criteria may be missing or patient reported. Any vitals not documented were not able to be obtained and vitals that have been documented are patient reported.  VideoDeclined- This patient declined Librarian, academic. Therefore the visit was completed with audio only.  Persons Participating in Visit: Patient.  AWV Questionnaire: No: Patient Medicare AWV questionnaire was not completed prior to this visit.  Cardiac Risk Factors include: advanced age (>27men, >29 women);dyslipidemia;hypertension;obesity (BMI >30kg/m2)     Objective:    Today's Vitals   08/29/24 0854  Weight: 161 lb (73 kg)  Height: 4' 11 (1.499 m)   Body mass index is 32.52 kg/m.     08/29/2024    9:08 AM 01/03/2024    9:33 AM 12/03/2023    2:19 PM 11/22/2023    9:57 AM 08/24/2023    1:55 PM 08/21/2022   10:37 AM 12/09/2021    9:18 AM  Advanced Directives  Does Patient Have a Medical Advance Directive? Yes Yes Yes Yes Yes Yes No  Type of Estate agent of [place];Living will Healthcare Power of [place];Living will Healthcare Power of [place];Living will Healthcare Power of  [place];Living will Healthcare Power of [place];Living will Healthcare Power of [place];Living will   Does patient want to make changes to medical advance directive?   No - Patient declined Yes (MAU/Ambulatory/Procedural Areas - Information given)  No - Patient declined   Copy of Healthcare Power of Attorney in Chart? No - copy requested No - copy requested No - copy requested No - copy requested No - copy requested No - copy requested     Current Medications (verified) Outpatient Encounter Medications as of 08/29/2024  Medication Sig   acetaminophen  (TYLENOL ) 500 MG tablet Take 1-2 tablets (500-1,000 mg total) by mouth every 4 (four) hours as needed for fever.   amLODipine  (NORVASC ) 10 MG tablet TAKE 1 TABLET BY MOUTH EVERY DAY   aspirin  81 MG tablet Take 81 mg by mouth daily.   atorvastatin  (LIPITOR) 40 MG tablet Take 1 tablet (40 mg total) by mouth daily.   benzonatate  (TESSALON ) 100 MG capsule Take 1 capsule (100 mg total) by mouth 3 (three) times daily as needed for cough.   calcium  carbonate (OS-CAL) 600 MG TABS tablet Take 600 mg by mouth daily as needed.   carvedilol  (COREG ) 12.5 MG tablet Take 1 tablet (12.5 mg total) by mouth 2 (two) times daily with a meal.   Cholecalciferol  (VITAMIN D ) 2000 UNITS CAPS Take by mouth daily. Patient is taking 3000 units instead of 2000   citalopram  (CELEXA ) 10 MG tablet TAKE 1 TABLET BY MOUTH EVERY DAY   clopidogrel  (PLAVIX ) 75 MG tablet Take 1 tablet (75 mg total) by mouth daily.   fenofibrate  (TRICOR ) 145 MG tablet  TAKE 1 TABLET BY MOUTH EVERY DAY   gabapentin  (NEURONTIN ) 100 MG capsule TAKE 1 CAPSULE BY MOUTH AT BEDTIME.   LORazepam  (ATIVAN ) 0.5 MG tablet Take 0.5-1 tablets (0.25-0.5 mg total) by mouth daily as needed for anxiety.   losartan  (COZAAR ) 100 MG tablet TAKE 1 TABLET (100 MG TOTAL) BY MOUTH DAILY. D/C 50 MG   Omega-3 Fatty Acids (FISH OIL) 1200 MG CAPS Take 1 capsule by mouth 2 (two) times daily.   vitamin B-12 (CYANOCOBALAMIN ) 1000  MCG tablet Take 1,000 mcg by mouth daily.   No facility-administered encounter medications on file as of 08/29/2024.    Allergies (verified) Hydrochlorothiazide  and Adhesive [tape]   History: Past Medical History:  Diagnosis Date   Anxiety    COVID-19    Diastolic dysfunction    a. 12/2021 Echo: EF 60-65%, no rwma, GrI DD, nl RV fxn, RVSP 36.19mmHg, mild MR.   Hyperlipidemia    Hypertension    Mild Mitral regurgitation    a. 12/2021 Echo: Mild MR.   Psoriasis    younger age   Spinal stenosis    Traumatic rupture of left ear drum    Past Surgical History:  Procedure Laterality Date   ABDOMINAL HYSTERECTOMY  12/14/1978   Partial   BASAL CELL CARCINOMA EXCISION  2006,2008,2011   BUNIONECTOMY     BUNIONECTOMY Bilateral 12/14/1990   COLONOSCOPY WITH PROPOFOL  N/A 12/09/2021   Procedure: COLONOSCOPY WITH PROPOFOL ;  Surgeon: Jinny Carmine, MD;  Location: [facility] ENDOSCOPY;  Service: Endoscopy;  Laterality: N/A;   KNEE ARTHROSCOPY     KNEE CLOSED REDUCTION  05/07/2015   Procedure: CLOSED MANIPULATION KNEE;  Surgeon: Ozell Flake, MD;  Location: [facility] ORS;  Service: Orthopedics;;   KNEE SURGERY Bilateral 2005,2006   Repair   LOWER EXTREMITY ANGIOGRAPHY Right 11/22/2023   Procedure: Lower Extremity Angiography;  Surgeon: Marea Selinda RAMAN, MD;  Location: [facility] INVASIVE CV LAB;  Service: Cardiovascular;  Laterality: Right;   LOWER EXTREMITY ANGIOGRAPHY Left 01/03/2024   Procedure: Lower Extremity Angiography;  Surgeon: Marea Selinda RAMAN, MD;  Location: [facility] INVASIVE CV LAB;  Service: Cardiovascular;  Laterality: Left;   MOHS SURGERY     nose bcc, left cheeck Dr. Loris f/u Q 12/2020   PERIPHERAL VASCULAR CATHETERIZATION  11/22/2023   REPLACEMENT TOTAL KNEE Left 04/11/2015   manipulation--05/07/2015   ROTATOR CUFF REPAIR W/ DISTAL CLAVICLE EXCISION     SHOULDER SURGERY Right    x2   SKIN CANCER EXCISION     TOTAL KNEE ARTHROPLASTY     VAGINAL HYSTERECTOMY     Family History  Problem Relation Age of  Onset   Cancer Mother        lung   Hypertension Sister    Heart attack Sister 12   Breast cancer Paternal Aunt    Social History   Socioeconomic History   Marital status: Widowed    Spouse name: Not on file   Number of children: 3   Years of education: Not on file   Highest education level: Not on file  Occupational History   Not on file  Tobacco Use   Smoking status: Never   Smokeless tobacco: Never  Vaping Use   Vaping status: Never Used  Substance and Sexual Activity   Alcohol use: No   Drug use: No   Sexual activity: Never  Other Topics Concern   Not on file  Social History Narrative   Widowed x 15 years as of 06/2021    3 sisters and 1  brother    Has children son age 54, daughter 45, son 1as of 11/2023   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      15 great grandchildren         Social Drivers of Corporate investment banker Strain: Low Risk  (08/29/2024)   Overall Financial Resource Strain (CARDIA)    Difficulty of Paying Living Expenses: Not hard at all  Food Insecurity: No Food Insecurity (08/29/2024)   Hunger Vital Sign    Worried About Running Out of Food in the Last Year: Never true    Ran Out of Food in the Last Year: Never true  Transportation Needs: No Transportation Needs (08/29/2024)   PRAPARE - Administrator, Civil Service (Medical): No    Lack of Transportation (Non-Medical): No  Physical Activity: Insufficiently Active (08/29/2024)   Exercise Vital Sign    Days of Exercise per Week: 3 days    Minutes of Exercise per Session: 30 min  Stress: No Stress Concern Present (08/29/2024)   [employer] of Occupational Health - Occupational Stress Questionnaire    Feeling of Stress: Not at all  Social Connections: Moderately Integrated (08/29/2024)   Social Connection and Isolation Panel    Frequency of Communication with Friends and Family: More than three times a week    Frequency of Social Gatherings with Friends and Family: More than  three times a week    Attends Religious Services: More than 4 times per year    Active Member of [employer] or Organizations: Yes    Attends Banker Meetings: More than 4 times per year    Marital Status: Widowed    Tobacco Counseling Counseling given: Not Answered    Clinical Intake:  Pre-visit preparation completed: Yes  Pain : No/denies pain     BMI - recorded: 32.52 Nutritional Status: BMI > 30  Obese Nutritional Risks: None Diabetes: No  Lab Results  Component Value Date   HGBA1C 6.0 09/21/2023   HGBA1C 6.0 09/18/2022   HGBA1C 5.8 03/12/2016     How often do you need to have someone help you when you read instructions, pamphlets, or other written materials from your doctor or pharmacy?: 1 - Never  Interpreter Needed?: No  Information entered by :: R. Elim Economou LPN   Activities of Daily Living     08/29/2024    8:56 AM 01/03/2024    9:30 AM  In your present state of health, do you have any difficulty performing the following activities:  Hearing? 0 0  Vision? 0 0  Difficulty concentrating or making decisions? 0 0  Walking or climbing stairs? 1   Dressing or bathing? 0   Doing errands, shopping? 0   Preparing Food and eating ? N   Using the Toilet? N   In the past six months, have you accidently leaked urine? Y   Do you have problems with loss of bowel control? N   Managing your Medications? N   Managing your Finances? N   Housekeeping or managing your Housekeeping? N     Patient Care Team: Dineen Rollene MATSU, FNP as PCP - General (Family Medicine) Darliss Rogue, MD as PCP - Cardiology (Cardiology) Marea Selinda RAMAN, MD as Referring Physician (Vascular Surgery) Loris Franky SAUNDERS, MD as Attending Physician (Dermatology)  I have updated your Care Teams any recent Medical Services you may have received from other providers in the past year.     Assessment:   This is  a routine wellness examination for [facility].  Hearing/Vision screen Hearing  Screening - Comments:: No issues Vision Screening - Comments:: readers   Goals Addressed             This Visit's Progress    Patient Stated       Wants to lose some weight       Depression Screen     08/29/2024    9:04 AM 03/21/2024    8:19 AM 12/30/2023   12:34 PM 09/21/2023    9:23 AM 08/24/2023    1:51 PM 07/07/2023   11:00 AM 03/22/2023    8:37 AM  PHQ 2/9 Scores  PHQ - 2 Score 0 0 0 0 0 0 0  PHQ- 9 Score 0   0 0      Fall Risk     08/29/2024    8:58 AM 03/21/2024    8:19 AM 12/30/2023   12:34 PM 09/21/2023    9:22 AM 08/24/2023    1:15 PM  Fall Risk   Falls in the past year? 1 0 0 0 1  Number falls in past yr: 0 0 0 0 0  Injury with Fall? 0 0 0 0 1  Comment     went to Urgent Care  Risk for fall due to : History of fall(s);Impaired balance/gait No Fall Risks No Fall Risks No Fall Risks History of fall(s);Impaired balance/gait  Follow up Falls evaluation completed;Falls prevention discussed Falls evaluation completed Falls evaluation completed Falls evaluation completed Falls evaluation completed;Falls prevention discussed    MEDICARE RISK AT HOME:  Medicare Risk at Home Any stairs in or around the home?: Yes If so, are there any without handrails?: No Home free of loose throw rugs in walkways, pet beds, electrical cords, etc?: Yes Adequate lighting in your home to reduce risk of falls?: Yes Life alert?: No Use of a cane, walker or w/c?: Yes Grab bars in the bathroom?: No Shower chair or bench in shower?: Yes Elevated toilet seat or a handicapped toilet?: No  TIMED UP AND GO:  Was the test performed?  No  Cognitive Function: 6CIT completed        08/29/2024    9:09 AM 08/24/2023    1:55 PM 08/21/2022   10:59 AM  6CIT Screen  What Year? 0 points 0 points 0 points  What month? 0 points 0 points 0 points  What time? 0 points 0 points 0 points  Count back from 20 0 points 0 points 0 points  Months in reverse 0 points 0 points 0 points  Repeat phrase 0 points  0 points 0 points  Total Score 0 points 0 points 0 points    Immunizations Immunization History  Administered Date(s) Administered   Fluad Quad(high Dose 65+) 10/02/2019, 09/05/2020, 08/29/2021, 09/18/2022   Fluad Trivalent(High Dose 65+) 09/21/2023   INFLUENZA, HIGH DOSE SEASONAL PF 10/10/2018   Influenza Split 09/13/2013   Influenza, Seasonal, Injecte, Preservative Fre 09/09/2016   Influenza,inj,Quad PF,6+ Mos 08/13/2014, 09/15/2015   PFIZER(Purple Top)SARS-COV-2 Vaccination 01/31/2020, 02/21/2020, 03/14/2020, 11/26/2020   Pneumococcal Conjugate-13 07/11/2014   Pneumococcal Polysaccharide-23 07/25/2015   Td 01/05/2014   Zoster Recombinant(Shingrix) 06/22/2018, 08/23/2018   Zoster, Live 07/11/2014    Screening Tests Health Maintenance  Topic Date Due   DTaP/Tdap/Td (2 - Tdap) 01/06/2024   Influenza Vaccine  07/14/2024   COVID-19 Vaccine (5 - 2025-26 season) 08/14/2024   Medicare Annual Wellness (AWV)  08/23/2024   Mammogram  01/10/2025   Pneumococcal Vaccine: 50+  Years  Completed   DEXA SCAN  Completed   Hepatitis C Screening  Completed   Zoster Vaccines- Shingrix  Completed   HPV VACCINES  Aged Out   Meningococcal B Vaccine  Aged Out   Colonoscopy  Discontinued    Health Maintenance Items Addressed: Discussed the need to update Tetanus (Tdap) vaccine. Patient declines covid vaccine.  Additional Screening:  Vision Screening: Recommended annual ophthalmology exams for early detection of glaucoma and other disorders of the eye. Is the patient up to date with their annual eye exam?  Yes  Who is the provider or what is the name of the office in which the patient attends annual eye exams? [facility]  Dental Screening: Recommended annual dental exams for proper oral hygiene  Community Resource Referral / Chronic Care Management: CRR required this visit?  No   CCM required this visit?  No   Plan:    I have personally reviewed and noted the following in the  patient's chart:   Medical and social history Use of alcohol, tobacco or illicit drugs  Current medications and supplements including opioid prescriptions. Patient is not currently taking opioid prescriptions. Functional ability and status Nutritional status Physical activity Advanced directives List of other physicians Hospitalizations, surgeries, and ER visits in previous 12 months Vitals Screenings to include cognitive, depression, and falls Referrals and appointments  In addition, I have reviewed and discussed with patient certain preventive protocols, quality metrics, and best practice recommendations. A written personalized care plan for preventive services as well as general preventive health recommendations were provided to patient.   Angeline Fredericks, LPN   [id number]   After Visit Summary: (MyChart) Due to this being a telephonic visit, the after visit summary with patients personalized plan was offered to patient via MyChart   Notes: Nothing significant to report at this time.

## 2024-08-29 NOTE — Patient Instructions (Addendum)
 Kristen Powell,  Thank you for taking the time for your Medicare Wellness Visit. I appreciate your continued commitment to your health goals. Please review the care plan we discussed, and feel free to reach out if I can assist you further.  Medicare recommends these wellness visits once per year to help you and your care team stay ahead of potential health issues. These visits are designed to focus on prevention, allowing your provider to concentrate on managing your acute and chronic conditions during your regular appointments.  Please note that Annual Wellness Visits do not include a physical exam. Some assessments may be limited, especially if the visit was conducted virtually. If needed, we may recommend a separate in-person follow-up with your provider.  Ongoing Care Seeing your primary care provider every 3 to 6 months helps us  monitor your health and provide consistent, personalized care.   Remember to update your Tetanus (Tdap) vaccine at your pharmacy.  Referrals If a referral was made during today's visit and you haven't received any updates within two weeks, please contact the referred provider directly to check on the status.  Recommended Screenings:  Health Maintenance  Topic Date Due   DTaP/Tdap/Td vaccine (2 - Tdap) 01/06/2024   COVID-19 Vaccine (5 - 2025-26 season) 08/14/2024   Breast Cancer Screening  01/10/2025   Medicare Annual Wellness Visit  08/29/2025   Pneumococcal Vaccine for age over 36  Completed   Flu Shot  Completed   DEXA scan (bone density measurement)  Completed   Hepatitis C Screening  Completed   Zoster (Shingles) Vaccine  Completed   HPV Vaccine  Aged Out   Meningitis B Vaccine  Aged Out   Colon Cancer Screening  Discontinued       08/29/2024    9:08 AM  Advanced Directives  Does Patient Have a Medical Advance Directive? Yes  Type of Estate agent of [place];Living will  Copy of Healthcare Power of Attorney in Chart? No - copy  requested   Advance Care Planning is important because it: Ensures you receive medical care that aligns with your values, goals, and preferences. Provides guidance to your family and loved ones, reducing the emotional burden of decision-making during critical moments.  Vision: Annual vision screenings are recommended for early detection of glaucoma, cataracts, and diabetic retinopathy. These exams can also reveal signs of chronic conditions such as diabetes and high blood pressure.  Dental: Annual dental screenings help detect early signs of oral cancer, gum disease, and other conditions linked to overall health, including heart disease and diabetes.  Please see the attached documents for additional preventive care recommendations.

## 2024-10-27 ENCOUNTER — Other Ambulatory Visit (INDEPENDENT_AMBULATORY_CARE_PROVIDER_SITE_OTHER): Payer: Self-pay | Admitting: Vascular Surgery

## 2024-10-27 ENCOUNTER — Other Ambulatory Visit: Payer: Self-pay | Admitting: Cardiology

## 2024-10-30 ENCOUNTER — Other Ambulatory Visit: Payer: Self-pay | Admitting: Family

## 2024-10-30 DIAGNOSIS — I10 Essential (primary) hypertension: Secondary | ICD-10-CM

## 2024-10-31 ENCOUNTER — Ambulatory Visit (INDEPENDENT_AMBULATORY_CARE_PROVIDER_SITE_OTHER): Admitting: Family

## 2024-10-31 ENCOUNTER — Encounter: Payer: Self-pay | Admitting: Family

## 2024-10-31 VITALS — BP 136/64 | HR 80 | Temp 98.3°F | Ht 59.0 in | Wt 163.4 lb

## 2024-10-31 DIAGNOSIS — E782 Mixed hyperlipidemia: Secondary | ICD-10-CM

## 2024-10-31 DIAGNOSIS — I70213 Atherosclerosis of native arteries of extremities with intermittent claudication, bilateral legs: Secondary | ICD-10-CM | POA: Diagnosis not present

## 2024-10-31 DIAGNOSIS — R7303 Prediabetes: Secondary | ICD-10-CM

## 2024-10-31 DIAGNOSIS — I10 Essential (primary) hypertension: Secondary | ICD-10-CM | POA: Diagnosis not present

## 2024-10-31 MED ORDER — ATORVASTATIN CALCIUM 40 MG PO TABS: 40.0000 mg | ORAL_TABLET | Freq: Every day | ORAL | 3 refills | Status: AC

## 2024-10-31 NOTE — Assessment & Plan Note (Signed)
Chronic, stable.continue carvedilol 12.5mg  twice daily, amlodipine 10 mg daily, losartan 100 mg daily

## 2024-10-31 NOTE — Progress Notes (Signed)
 Assessment & Plan:  Mixed hyperlipidemia Assessment & Plan: Advised to resume Lipitor 40 mg.  I have refilled today.  Pending hepatic enzymes lipid panel in 6 weeks time  Orders: -     Atorvastatin  Calcium ; Take 1 tablet (40 mg total) by mouth daily.  Dispense: 90 tablet; Refill: 3 -     Comprehensive metabolic panel with GFR; Future -     Lipid panel; Future  Atherosclerosis of native artery of both lower extremities with intermittent claudication  Prediabetes -     Hemoglobin A1c; Future  Essential hypertension Assessment & Plan: Chronic, stable.continue carvedilol  12.5mg  twice daily, amlodipine  10 mg daily, losartan  100 mg daily      Return precautions given.   Risks, benefits, and alternatives of the medications and treatment plan prescribed today were discussed, and patient expressed understanding.   Education regarding symptom management and diagnosis given to patient on AVS either electronically or printed.  Return in about 6 months (around 04/30/2025).  Rollene Northern, FNP  Subjective:    Patient ID: Kristen Powell, female    DOB: 1946/06/13, 78 y.o.   MRN: [id number]  CC: Kristen Powell is a 78 y.o. female who presents today for follow up.   HPI: She feels well today. No new complaints  She does her own house work, less yard work.   She is staying physically active Denies CP, sob. Slight BL leg swelling at end of the day. Resolves with compression stockings.    She stopped lipitor due ' a recall'.    Tdap due Mammogram up-to-date. Allergies: Hydrochlorothiazide  and Adhesive [tape] Current Outpatient Medications on File Prior to Visit  Medication Sig Dispense Refill   acetaminophen  (TYLENOL ) 500 MG tablet Take 1-2 tablets (500-1,000 mg total) by mouth every 4 (four) hours as needed for fever. 30 tablet 0   amLODipine  (NORVASC ) 10 MG tablet TAKE 1 TABLET BY MOUTH EVERY DAY 90 tablet 1   aspirin  81 MG tablet Take 81 mg by mouth daily.      calcium  carbonate (OS-CAL) 600 MG TABS tablet Take 600 mg by mouth daily as needed.     carvedilol  (COREG ) 12.5 MG tablet TAKE 1 TABLET (12.5MG  TOTAL) BY MOUTH TWICE A DAY WITH MEALS 60 tablet 0   Cholecalciferol  (VITAMIN D ) 2000 UNITS CAPS Take by mouth daily. Patient is taking 3000 units instead of 2000     citalopram  (CELEXA ) 10 MG tablet TAKE 1 TABLET BY MOUTH EVERY DAY 90 tablet 3   clopidogrel  (PLAVIX ) 75 MG tablet TAKE 1 TABLET BY MOUTH EVERY DAY 90 tablet 3   fenofibrate  (TRICOR ) 145 MG tablet TAKE 1 TABLET BY MOUTH EVERY DAY 90 tablet 3   gabapentin  (NEURONTIN ) 100 MG capsule TAKE 1 CAPSULE BY MOUTH AT BEDTIME. 90 capsule 1   LORazepam  (ATIVAN ) 0.5 MG tablet Take 0.5-1 tablets (0.25-0.5 mg total) by mouth daily as needed for anxiety. 30 tablet 2   losartan  (COZAAR ) 100 MG tablet TAKE 1 TABLET (100 MG TOTAL) BY MOUTH DAILY. D/C 50 MG 90 tablet 1   Omega-3 Fatty Acids (FISH OIL) 1200 MG CAPS Take 1 capsule by mouth 2 (two) times daily.     vitamin B-12 (CYANOCOBALAMIN ) 1000 MCG tablet Take 1,000 mcg by mouth daily.     No current facility-administered medications on file prior to visit.    Review of Systems  Constitutional:  Negative for chills and fever.  Respiratory:  Negative for cough and shortness of breath.   Cardiovascular:  Positive  for leg swelling. Negative for chest pain and palpitations.  Gastrointestinal:  Negative for nausea and vomiting.      Objective:    BP 136/64   Pulse 80   Temp 98.3 F (36.8 C) (Oral)   Ht 4' 11 (1.499 m)   Wt 163 lb 6.4 oz (74.1 kg)   SpO2 95%   BMI 33.00 kg/m  BP Readings from Last 3 Encounters:  10/31/24 136/64  05/16/24 (!) 153/71  03/21/24 136/76   Wt Readings from Last 3 Encounters:  10/31/24 163 lb 6.4 oz (74.1 kg)  08/29/24 161 lb (73 kg)  05/16/24 166 lb 12.8 oz (75.7 kg)    Physical Exam Vitals reviewed.  Constitutional:      Appearance: She is well-developed.  Eyes:     Conjunctiva/sclera: Conjunctivae normal.   Cardiovascular:     Rate and Rhythm: Normal rate and regular rhythm.     Pulses: Normal pulses.     Heart sounds: Normal heart sounds.     Comments: No LE edema on my exam today.  No palpable cords or masses. No erythema or increased warmth. No asymmetry in calf size when compared bilaterally LE hair growth symmetric and present. No discoloration or varicosities noted. LE warm   Pulmonary:     Effort: Pulmonary effort is normal.     Breath sounds: Normal breath sounds. No wheezing, rhonchi or rales.  Musculoskeletal:     Right lower leg: No edema.     Left lower leg: No edema.  Skin:    General: Skin is warm and dry.  Neurological:     Mental Status: She is alert.  Psychiatric:        Speech: Speech normal.        Behavior: Behavior normal.        Thought Content: Thought content normal.

## 2024-10-31 NOTE — Assessment & Plan Note (Signed)
 Advised to resume Lipitor 40 mg.  I have refilled today.  Pending hepatic enzymes lipid panel in 6 weeks time

## 2024-10-31 NOTE — Patient Instructions (Addendum)
 Start crestor  40mg ; I have refilled  You are due for tetanus vaccine ( Tdap) You may also get RSV vaccine.   Nice to see you!

## 2024-11-14 ENCOUNTER — Ambulatory Visit (INDEPENDENT_AMBULATORY_CARE_PROVIDER_SITE_OTHER): Admitting: Vascular Surgery

## 2024-11-14 ENCOUNTER — Encounter (INDEPENDENT_AMBULATORY_CARE_PROVIDER_SITE_OTHER): Payer: Self-pay | Admitting: Vascular Surgery

## 2024-11-14 ENCOUNTER — Ambulatory Visit (INDEPENDENT_AMBULATORY_CARE_PROVIDER_SITE_OTHER)

## 2024-11-14 VITALS — BP 152/77 | HR 59 | Resp 18 | Ht 59.0 in | Wt 164.4 lb

## 2024-11-14 DIAGNOSIS — I70213 Atherosclerosis of native arteries of extremities with intermittent claudication, bilateral legs: Secondary | ICD-10-CM

## 2024-11-14 DIAGNOSIS — I10 Essential (primary) hypertension: Secondary | ICD-10-CM | POA: Diagnosis not present

## 2024-11-14 DIAGNOSIS — E782 Mixed hyperlipidemia: Secondary | ICD-10-CM | POA: Diagnosis not present

## 2024-11-14 DIAGNOSIS — I890 Lymphedema, not elsewhere classified: Secondary | ICD-10-CM | POA: Diagnosis not present

## 2024-11-14 NOTE — Progress Notes (Signed)
 MRN : [id number]  Kristen Powell is a 78 y.o. (Jun 14, 1946) female who presents with chief complaint of  Chief Complaint  Patient presents with   Follow-up    6 month follow up + ABI  .  History of Present Illness:   Discussed the use of AI scribe software for clinical note transcription with the patient, who gave verbal consent to proceed.  History of Present Illness Kristen Powell is a 78 year old female who presents with leg pain and swelling.   She has persistent leg pain with prolonged walking or standing, slightly improved compared with a year ago. She also has leg swelling that she controls with compression socks.  She has chronic foot pain after remote injuries with twisting of both feet and was told her foot is deformed. She has ongoing discomfort in the area of a prior toenail removal. She has not seen her podiatrist in some time.  She had a major fall in the past with multiple injuries and shoulder surgeries. She has chronic back problems from an injury and has received prednisone  injections for pain.  She has neuropathy and started Neuropaway one week ago, with some reduction in leg cramps. She also uses pickle juice to help with cramps.  She has hip pain that limits her walking.  From a vascular standpoint, her claudication symptoms are fairly mild at this point.  They are much better than before her bilateral lower extremity interventions that were done in a staged fashion almost a year ago.    Results DIAGNOSTIC Ankle Brachial Index: 1.01 bilaterally (05/15/2024)  Current Outpatient Medications  Medication Sig Dispense Refill   acetaminophen  (TYLENOL ) 500 MG tablet Take 1-2 tablets (500-1,000 mg total) by mouth every 4 (four) hours as needed for fever. 30 tablet 0   amLODipine  (NORVASC ) 10 MG tablet TAKE 1 TABLET BY MOUTH EVERY DAY 90 tablet 1   aspirin  81 MG tablet Take 81 mg by mouth daily.     atorvastatin  (LIPITOR) 40 MG tablet Take 1 tablet (40 mg  total) by mouth daily. 90 tablet 3   calcium  carbonate (OS-CAL) 600 MG TABS tablet Take 600 mg by mouth daily as needed.     carvedilol  (COREG ) 12.5 MG tablet TAKE 1 TABLET (12.5MG  TOTAL) BY MOUTH TWICE A DAY WITH MEALS 60 tablet 0   Cholecalciferol  (VITAMIN D ) 2000 UNITS CAPS Take by mouth daily. Patient is taking 3000 units instead of 2000     citalopram  (CELEXA ) 10 MG tablet TAKE 1 TABLET BY MOUTH EVERY DAY 90 tablet 3   clopidogrel  (PLAVIX ) 75 MG tablet TAKE 1 TABLET BY MOUTH EVERY DAY 90 tablet 3   fenofibrate  (TRICOR ) 145 MG tablet TAKE 1 TABLET BY MOUTH EVERY DAY 90 tablet 3   gabapentin  (NEURONTIN ) 100 MG capsule TAKE 1 CAPSULE BY MOUTH AT BEDTIME. 90 capsule 1   LORazepam  (ATIVAN ) 0.5 MG tablet Take 0.5-1 tablets (0.25-0.5 mg total) by mouth daily as needed for anxiety. 30 tablet 2   losartan  (COZAAR ) 100 MG tablet TAKE 1 TABLET (100 MG TOTAL) BY MOUTH DAILY. D/C 50 MG 90 tablet 1   Omega-3 Fatty Acids (FISH OIL) 1200 MG CAPS Take 1 capsule by mouth 2 (two) times daily.     vitamin B-12 (CYANOCOBALAMIN ) 1000 MCG tablet Take 1,000 mcg by mouth daily.     No current facility-administered medications for this visit.    Past Medical History:  Diagnosis Date   Anxiety    COVID-19  Diastolic dysfunction    a. 12/2021 Echo: EF 60-65%, no rwma, GrI DD, nl RV fxn, RVSP 36.51mmHg, mild MR.   Hyperlipidemia    Hypertension    Mild Mitral regurgitation    a. 12/2021 Echo: Mild MR.   Psoriasis    younger age   Spinal stenosis    Traumatic rupture of left ear drum     Past Surgical History:  Procedure Laterality Date   ABDOMINAL HYSTERECTOMY  12/14/1978   Partial   BASAL CELL CARCINOMA EXCISION  2006,2008,2011   BUNIONECTOMY     BUNIONECTOMY Bilateral 12/14/1990   COLONOSCOPY WITH PROPOFOL  N/A 12/09/2021   Procedure: COLONOSCOPY WITH PROPOFOL ;  Surgeon: Jinny Carmine, MD;  Location: [facility];  Service: Endoscopy;  Laterality: N/A;   KNEE ARTHROSCOPY     KNEE CLOSED REDUCTION   05/07/2015   Procedure: CLOSED MANIPULATION KNEE;  Surgeon: Ozell Flake, MD;  Location: [facility];  Service: Orthopedics;;   KNEE SURGERY Bilateral 2005,2006   Repair   LOWER EXTREMITY ANGIOGRAPHY Right 11/22/2023   Procedure: Lower Extremity Angiography;  Surgeon: Marea Selinda RAMAN, MD;  Location: [facility];  Service: Cardiovascular;  Laterality: Right;   LOWER EXTREMITY ANGIOGRAPHY Left 01/03/2024   Procedure: Lower Extremity Angiography;  Surgeon: Marea Selinda RAMAN, MD;  Location: [facility];  Service: Cardiovascular;  Laterality: Left;   MOHS SURGERY     nose bcc, left cheeck Dr. Loris f/u Q 12/2020   PERIPHERAL VASCULAR CATHETERIZATION  11/22/2023   REPLACEMENT TOTAL KNEE Left 04/11/2015   manipulation--05/07/2015   ROTATOR CUFF REPAIR W/ DISTAL CLAVICLE EXCISION     SHOULDER SURGERY Right    x2   SKIN CANCER EXCISION     TOTAL KNEE ARTHROPLASTY     VAGINAL HYSTERECTOMY       Social History   Tobacco Use   Smoking status: Never   Smokeless tobacco: Never  Vaping Use   Vaping status: Never Used  Substance Use Topics   Alcohol use: No   Drug use: No      Family History  Problem Relation Age of Onset   Cancer Mother        lung   Hypertension Sister    Heart attack Sister 46   Breast cancer Paternal Aunt      Allergies  Allergen Reactions   Hydrochlorothiazide  Other (See Comments)    Hyponatremia    Adhesive [Tape] Rash    redness     REVIEW OF SYSTEMS (Negative unless checked)   Constitutional: [] Weight loss  [] Fever  [] Chills Cardiac: [] Chest pain   [] Chest pressure   [] Palpitations   [] Shortness of breath when laying flat   [] Shortness of breath at rest   [] Shortness of breath with exertion. Vascular:  [x] Pain in legs with walking   [] Pain in legs at rest   [] Pain in legs when laying flat   [x] Claudication   [] Pain in feet when walking  [] Pain in feet at rest  [] Pain in feet when laying flat   [] History of DVT   [] Phlebitis   [] Swelling in legs    [] Varicose veins   [] Non-healing ulcers Pulmonary:   [] Uses home oxygen   [] Productive cough   [] Hemoptysis   [] Wheeze  [] COPD   [] Asthma Neurologic:  [] Dizziness  [] Blackouts   [] Seizures   [] History of stroke   [] History of TIA  [] Aphasia   [] Temporary blindness   [] Dysphagia   [] Weakness or numbness in arms   [] Weakness or numbness in legs  Musculoskeletal:  [x] Arthritis   [] Joint swelling   [x] Joint pain   [] Low back pain Hematologic:  [] Easy bruising  [] Easy bleeding   [] Hypercoagulable state   [] Anemic   Gastrointestinal:  [] Blood in stool   [] Vomiting blood  [] Gastroesophageal reflux/heartburn   [] Abdominal pain Genitourinary:  [] Chronic kidney disease   [] Difficult urination  [] Frequent urination  [] Burning with urination   [] Hematuria Skin:  [] Rashes   [] Ulcers   [] Wounds Psychological:  [] History of anxiety   []  History of major depression.  Physical Examination  BP (!) 152/77 (BP Location: Right Arm, Patient Position: Sitting, Cuff Size: Normal)   Pulse (!) 59   Resp 18   Ht 4' 11 (1.499 m)   Wt 164 lb 6.4 oz (74.6 kg)   BMI 33.20 kg/m  Gen:  WD/WN, NAD Head: [place]/AT, No temporalis wasting. Ear/Nose/Throat: Hearing grossly intact, nares w/o erythema or drainage Eyes: Conjunctiva clear. Sclera non-icteric Neck: Supple.  Trachea midline Pulmonary:  Good air movement, no use of accessory muscles.  Cardiac: bradycardic Vascular:  Vessel Right Left  Radial Palpable Palpable                          PT 1+ Palpable Not Palpable  DP 2+ Palpable 2+ Palpable   Gastrointestinal: soft, non-tender/non-distended. No guarding/reflex.  Musculoskeletal: M/S 5/5 throughout.  No deformity or atrophy. Trace RLE edema, 1-2+ LLE edema. Neurologic: Sensation grossly intact in extremities.  Symmetrical.  Speech is fluent.  Psychiatric: Judgment intact, Mood & affect appropriate for pt's clinical situation. Dermatologic: No rashes or ulcers noted.  No cellulitis or open wounds.  Physical  Exam     Labs No results found for this or any previous visit (from the past 2160 hours).  Radiology No results found.  Assessment/Plan   Assessment & Plan Peripheral artery disease with intermittent claudication, bilateral lower extremities Intermittent claudication with normal ankle-brachial index at 1.01 bilaterally. Circulation satisfactory. - Follow-up in six months. Continue current meds  Chronic lymphedema of lower extremity Chronic swelling likely due to previous knee replacement and trauma. Consistent with lymphedema. - Continue use of compression socks. - Referred to foot doctor for evaluation of foot pain and potential deformity. - Recommended hip evaluation for potential symptom contribution.  Essential hypertension blood pressure control important in reducing the progression of atherosclerotic disease. On appropriate oral medications.     HLD (hyperlipidemia) lipid control important in reducing the progression of atherosclerotic disease. On Fish oil and Tricor    Selinda Gu, MD  11/14/2024 11:13 AM    This note was created with Dragon medical transcription system.  Any errors from dictation are purely unintentional

## 2024-11-15 LAB — VAS US ABI WITH/WO TBI
Left ABI: 1.01
Right ABI: 1.01

## 2024-11-23 ENCOUNTER — Other Ambulatory Visit: Payer: Self-pay | Admitting: Cardiology

## 2024-12-05 ENCOUNTER — Ambulatory Visit (INDEPENDENT_AMBULATORY_CARE_PROVIDER_SITE_OTHER)

## 2024-12-05 ENCOUNTER — Encounter: Payer: Self-pay | Admitting: Podiatry

## 2024-12-05 ENCOUNTER — Ambulatory Visit (INDEPENDENT_AMBULATORY_CARE_PROVIDER_SITE_OTHER): Admitting: Podiatry

## 2024-12-05 VITALS — Ht 59.0 in | Wt 164.4 lb

## 2024-12-05 DIAGNOSIS — M7752 Other enthesopathy of left foot: Secondary | ICD-10-CM | POA: Diagnosis not present

## 2024-12-05 DIAGNOSIS — M7751 Other enthesopathy of right foot: Secondary | ICD-10-CM

## 2024-12-05 DIAGNOSIS — M19072 Primary osteoarthritis, left ankle and foot: Secondary | ICD-10-CM

## 2024-12-05 NOTE — Progress Notes (Signed)
 "  Chief Complaint  Patient presents with   Foot Pain    Pt is here due to pain in both feet, states she thinks she has gout, having trouble walking due to this issue, has swelling to both feet. Possible hammertoe to the right foot, has a had circulation issues in both legs, had procedures done to stop the blockage.     HPI: 78 y.o. female presenting today for evaluation of bilateral foot pain.  Past Medical History:  Diagnosis Date   Anxiety    COVID-19    Diastolic dysfunction    a. 12/2021 Echo: EF 60-65%, no rwma, GrI DD, nl RV fxn, RVSP 36.90mmHg, mild MR.   Hyperlipidemia    Hypertension    Mild Mitral regurgitation    a. 12/2021 Echo: Mild MR.   Psoriasis    younger age   Spinal stenosis    Traumatic rupture of left ear drum     Past Surgical History:  Procedure Laterality Date   ABDOMINAL HYSTERECTOMY  12/14/1978   Partial   BASAL CELL CARCINOMA EXCISION  2006,2008,2011   BUNIONECTOMY     BUNIONECTOMY Bilateral 12/14/1990   COLONOSCOPY WITH PROPOFOL  N/A 12/09/2021   Procedure: COLONOSCOPY WITH PROPOFOL ;  Surgeon: Jinny Carmine, MD;  Location: [facility];  Service: Endoscopy;  Laterality: N/A;   KNEE ARTHROSCOPY     KNEE CLOSED REDUCTION  05/07/2015   Procedure: CLOSED MANIPULATION KNEE;  Surgeon: Ozell Flake, MD;  Location: [facility];  Service: Orthopedics;;   KNEE SURGERY Bilateral 2005,2006   Repair   LOWER EXTREMITY ANGIOGRAPHY Right 11/22/2023   Procedure: Lower Extremity Angiography;  Surgeon: Marea Selinda RAMAN, MD;  Location: [facility];  Service: Cardiovascular;  Laterality: Right;   LOWER EXTREMITY ANGIOGRAPHY Left 01/03/2024   Procedure: Lower Extremity Angiography;  Surgeon: Marea Selinda RAMAN, MD;  Location: [facility];  Service: Cardiovascular;  Laterality: Left;   MOHS SURGERY     nose bcc, left cheeck Dr. Loris f/u Q 12/2020   PERIPHERAL VASCULAR CATHETERIZATION  11/22/2023   REPLACEMENT TOTAL KNEE Left 04/11/2015   manipulation--05/07/2015    ROTATOR CUFF REPAIR W/ DISTAL CLAVICLE EXCISION     SHOULDER SURGERY Right    x2   SKIN CANCER EXCISION     TOTAL KNEE ARTHROPLASTY     VAGINAL HYSTERECTOMY      Allergies[1]   Physical Exam: General: The patient is alert and oriented x3 in no acute distress.  Dermatology: Skin is warm, dry and supple bilateral lower extremities.   Vascular:  VAS US  ABI WITH/WO TBI (Accession [id number]) (Order [id number]) Vascular Ultrasound Date: 11/14/2024 Department: [facility] Released By: Rickford Herter R Authorizing: Marea Selinda RAMAN, MD  ABI Findings:  +---------+------------------+-----+---------+--------+  Right   Rt Pressure (mmHg)IndexWaveform Comment   +---------+------------------+-----+---------+--------+  Brachial 172                                       +---------+------------------+-----+---------+--------+  PTA     127               0.74 biphasic           +---------+------------------+-----+---------+--------+  DP      174               1.01 triphasic          +---------+------------------+-----+---------+--------+  [place] Oka  0.68                    +---------+------------------+-----+---------+--------+   +---------+------------------+-----+---------+-------+  Left    Lt Pressure (mmHg)IndexWaveform Comment  +---------+------------------+-----+---------+-------+  Brachial 167                                      +---------+------------------+-----+---------+-------+  PTA     122               0.71 biphasic          +---------+------------------+-----+---------+-------+  DP      174               1.01 triphasic         +---------+------------------+-----+---------+-------+  Great Toe106               0.62                   +---------+------------------+-----+---------+-------+   +-------+-----------+-----------+------------+------------+  ABI/TBIToday's ABIToday's  TBIPrevious ABIPrevious TBI  +-------+-----------+-----------+------------+------------+  Right 1.01       0.68       1.01        0.92          +-------+-----------+-----------+------------+------------+  Left  1.01       0.62       1.09        0.93          +-------+-----------+-----------+------------+------------+  Bilateral ABIs appear essentially unchanged compared to prior study on 05/16/2024.  Summary:  Right: Resting right ankle-brachial index is within normal range. The right toe-brachial index is abnormal.  Left: Resting left ankle-brachial index is within normal range. The left toe-brachial index is abnormal.   Op Note by Marea Selinda RAMAN, MD at 01/03/2024 10:58 AM  Author: Marea Selinda RAMAN, MD Service: Vascular Surgery Author Type: Physician  Filed: 01/03/2024 11:03 AM Date of Service: 01/03/2024 10:58 AM Status: Signed  Findings:   Aortogram:  This demonstrated normal renal arteries and normal aorta and iliac segments without significant stenosis. Left Lower Extremity:  This demonstrated fairly normal common femoral artery, profunda femoris artery, and proximal superficial femoral artery.  In the mid to distal superficial femoral artery there was significant disease over about 8 to 10 cm span.  The tightest area appeared to be in the 80% range.  The above-knee popliteal artery normalized and there was normal flow through the remainder the popliteal artery.  The anterior tibial artery was the dominant runoff down into the foot with a small peroneal artery providing a second runoff vessel distally.  The posterior tibial artery was chronically occluded.  Neurological: Grossly intact via light touch  Musculoskeletal Exam: No pedal deformities noted  Radiographic Exam B/L feet 12/05/2024:  Normal osseous mineralization. Joint spaces preserved.  No fractures or osseous irregularities noted.  Assessment/Plan of Care: 1.  Advanced DJD/hallux limitus left foot  -Patient  evaluated.  X-rays reviewed -The patient's symptoms are mostly localized around the great toe joint of the left foot.  Findings consistent with severe advanced arthritis. -Injection of 0.5 cc Celestone  Soluspan injected into the first MTP of the left foot -Given the extensive nature of the arthritis to the left great toe joint I do believe that surgery is an option for the patient  -Recommend shoes that do not irritate or constrict the toebox area.  Advised against going barefoot -Return to clinic PRN  Thresa EMERSON Sar, DPM [facility]  Dr. Thresa EMERSON Sar, DPM    [address] [place], [place] [address]                Office [phone]  Fax [phone]        [1]  Allergies Allergen Reactions   Hydrochlorothiazide  Other (See Comments)    Hyponatremia    Adhesive [Tape] Rash    redness   "

## 2024-12-12 ENCOUNTER — Telehealth: Payer: Self-pay

## 2024-12-12 ENCOUNTER — Other Ambulatory Visit (INDEPENDENT_AMBULATORY_CARE_PROVIDER_SITE_OTHER)

## 2024-12-12 DIAGNOSIS — R7303 Prediabetes: Secondary | ICD-10-CM | POA: Diagnosis not present

## 2024-12-12 DIAGNOSIS — M19072 Primary osteoarthritis, left ankle and foot: Secondary | ICD-10-CM | POA: Diagnosis not present

## 2024-12-12 DIAGNOSIS — M7752 Other enthesopathy of left foot: Secondary | ICD-10-CM | POA: Diagnosis not present

## 2024-12-12 DIAGNOSIS — E782 Mixed hyperlipidemia: Secondary | ICD-10-CM | POA: Diagnosis not present

## 2024-12-12 LAB — LIPID PANEL
Cholesterol: 137 mg/dL (ref 28–200)
HDL: 48.7 mg/dL
LDL Cholesterol: 65 mg/dL (ref 10–99)
NonHDL: 88.38
Total CHOL/HDL Ratio: 3
Triglycerides: 117 mg/dL (ref 10.0–149.0)
VLDL: 23.4 mg/dL (ref 0.0–40.0)

## 2024-12-12 LAB — COMPREHENSIVE METABOLIC PANEL WITH GFR
ALT: 15 U/L (ref 3–35)
AST: 19 U/L (ref 5–37)
Albumin: 4.2 g/dL (ref 3.5–5.2)
Alkaline Phosphatase: 27 U/L — ABNORMAL LOW (ref 39–117)
BUN: 22 mg/dL (ref 6–23)
CO2: 27 meq/L (ref 19–32)
Calcium: 9.1 mg/dL (ref 8.4–10.5)
Chloride: 102 meq/L (ref 96–112)
Creatinine, Ser: 0.98 mg/dL (ref 0.40–1.20)
GFR: 55.13 mL/min — ABNORMAL LOW
Glucose, Bld: 82 mg/dL (ref 70–99)
Potassium: 5.4 meq/L — ABNORMAL HIGH (ref 3.5–5.1)
Sodium: 134 meq/L — ABNORMAL LOW (ref 135–145)
Total Bilirubin: 0.4 mg/dL (ref 0.2–1.2)
Total Protein: 7.1 g/dL (ref 6.0–8.3)

## 2024-12-12 LAB — HEMOGLOBIN A1C: Hgb A1c MFr Bld: 5.9 % (ref 4.6–6.5)

## 2024-12-12 MED ORDER — BETAMETHASONE SOD PHOS & ACET 6 (3-3) MG/ML IJ SUSP
3.0000 mg | Freq: Once | INTRAMUSCULAR | Status: AC
  Administered 2024-12-12: 3 mg via INTRA_ARTICULAR

## 2024-12-12 NOTE — Telephone Encounter (Signed)
 Copied from CRM #[id number]. Topic: Clinical - Lab/Test Results >> Dec 12, 2024  3:50 PM Rea ORN wrote: Reason for CRM: Pt calling to be advised of recent lab results.  Please call back to advise, [phone]

## 2024-12-12 NOTE — Telephone Encounter (Signed)
 Copied from CRM #[id number]. Topic: Clinical - Lab/Test Results >> Dec 12, 2024  3:37 PM Donna BRAVO wrote: Reason for CRM: Patient would like a call when labs have been resulted. Patient phone [phone]

## 2024-12-13 NOTE — Telephone Encounter (Signed)
 Spoke to pt informed her that as soon as Rollene  results them I will reach back out to her.

## 2024-12-18 ENCOUNTER — Telehealth: Payer: Self-pay

## 2024-12-18 DIAGNOSIS — I10 Essential (primary) hypertension: Secondary | ICD-10-CM

## 2024-12-18 NOTE — Telephone Encounter (Signed)
 Patient is requesting a call back for scheduling once labs are ordered and can be reached at [phone].

## 2024-12-18 NOTE — Telephone Encounter (Signed)
 Copied from CRM [id number]. Topic: Clinical - Lab/Test Results >> Dec 18, 2024 11:53 AM Kristen Powell wrote: Reason for CRM: Patient called in wanting to be scheduled for lab work, I informed patient that once the lab orders are in then she can be scheduled. Patient is requesting a call back for scheduling once labs are ordered and can be reached at [phone].

## 2024-12-19 ENCOUNTER — Other Ambulatory Visit: Payer: Self-pay | Admitting: Family

## 2024-12-19 DIAGNOSIS — Z1231 Encounter for screening mammogram for malignant neoplasm of breast: Secondary | ICD-10-CM

## 2024-12-21 ENCOUNTER — Encounter: Payer: Self-pay | Admitting: Nurse Practitioner

## 2024-12-21 ENCOUNTER — Ambulatory Visit: Attending: Nurse Practitioner | Admitting: Nurse Practitioner

## 2024-12-21 VITALS — BP 138/50 | HR 60 | Ht 59.0 in | Wt 163.0 lb

## 2024-12-21 DIAGNOSIS — R0989 Other specified symptoms and signs involving the circulatory and respiratory systems: Secondary | ICD-10-CM | POA: Insufficient documentation

## 2024-12-21 DIAGNOSIS — I10 Essential (primary) hypertension: Secondary | ICD-10-CM | POA: Insufficient documentation

## 2024-12-21 DIAGNOSIS — R0609 Other forms of dyspnea: Secondary | ICD-10-CM | POA: Insufficient documentation

## 2024-12-21 DIAGNOSIS — I340 Nonrheumatic mitral (valve) insufficiency: Secondary | ICD-10-CM | POA: Insufficient documentation

## 2024-12-21 DIAGNOSIS — I739 Peripheral vascular disease, unspecified: Secondary | ICD-10-CM | POA: Insufficient documentation

## 2024-12-21 DIAGNOSIS — E785 Hyperlipidemia, unspecified: Secondary | ICD-10-CM | POA: Insufficient documentation

## 2024-12-21 DIAGNOSIS — I70219 Atherosclerosis of native arteries of extremities with intermittent claudication, unspecified extremity: Secondary | ICD-10-CM | POA: Diagnosis not present

## 2024-12-21 NOTE — Patient Instructions (Signed)
 Medication Instructions:  Your physician recommends that you continue on your current medications as directed. Please refer to the Current Medication list given to you today.   *If you need a refill on your cardiac medications before your next appointment, please call your pharmacy*  Lab Work: None ordered at this time  If you have labs (blood work) drawn today and your tests are completely normal, you will receive your results only by: MyChart Message (if you have MyChart) OR A paper copy in the mail If you have any lab test that is abnormal or we need to change your treatment, we will call you to review the results.  Testing/Procedures: Your physician has requested that you have an echocardiogram. Echocardiography is a painless test that uses sound waves to create images of your heart. It provides your doctor with information about the size and shape of your heart and how well your hearts chambers and valves are working.   You may receive an ultrasound enhancing agent through an IV if needed to better visualize your heart during the echo. This procedure takes approximately one hour.  There are no restrictions for this procedure.  This will take place at [address] [facility] [address], [place] [address]  Your physician has requested that you have a carotid duplex. This test is an ultrasound of the carotid arteries in your neck. It looks at blood flow through these arteries that supply the brain with blood.   Allow one hour for this exam.  There are no restrictions or special instructions.  This will take place at [address] [facility]) [address], [place] [address]  Please note: We ask at that you not bring children with you during ultrasound (echo/ vascular) testing. Due to room size and safety concerns, children are not allowed in the ultrasound rooms during exams. Our front office staff cannot provide observation of children in our lobby area while  testing is being conducted. An adult accompanying a patient to their appointment will only be allowed in the ultrasound room at the discretion of the ultrasound technician under special circumstances. We apologize for any inconvenience.  Follow-Up: At [facility], you and your health needs are our priority.  As part of our continuing mission to provide you with exceptional heart care, our providers are all part of one team.  This team includes your primary Cardiologist (physician) and Advanced Practice Providers or APPs (Physician Assistants and Nurse Practitioners) who all work together to provide you with the care you need, when you need it.  Your next appointment:   3 month(s)  Provider:   You may see Redell Cave, MD or Lonni Meager, NP

## 2024-12-21 NOTE — Progress Notes (Signed)
 "    Office Visit    Patient Name: Kristen Powell Date of Encounter: 12/21/2024  Primary Care Provider:  Dineen Rollene MATSU, FNP Primary Cardiologist:  Redell Cave, MD    Chief Complaint    79 y.o. female with a history of hypertension, hyperlipidemia, diastolic dysfunction, mild mitral regurgitation, PAD, hyponatremia (in setting of hydrochlorothiazide  therapy), and anxiety, presents for hypertension follow-up.  Past Medical History   Subjective   Past Medical History:  Diagnosis Date   Anxiety    COVID-19    Diastolic dysfunction    a. 12/2021 Echo: EF 60-65%, no rwma, GrI DD, nl RV fxn, RVSP 36.43mmHg, mild MR.   Hyperlipidemia    Hypertension    Mild Mitral regurgitation    a. 12/2021 Echo: Mild MR.   PAD (peripheral artery disease)    a. 11/2023 s/p RLE PTA; b. 12/2023 s/p L distal SFA stenting; c. 11/2024 ABIs: R 1.01, L 1.01.   Psoriasis    younger age   Spinal stenosis    Traumatic rupture of left ear drum    Past Surgical History:  Procedure Laterality Date   ABDOMINAL HYSTERECTOMY  12/14/1978   Partial   BASAL CELL CARCINOMA EXCISION  2006,2008,2011   BUNIONECTOMY     BUNIONECTOMY Bilateral 12/14/1990   COLONOSCOPY WITH PROPOFOL  N/A 12/09/2021   Procedure: COLONOSCOPY WITH PROPOFOL ;  Surgeon: Jinny Carmine, MD;  Location: [facility];  Service: Endoscopy;  Laterality: N/A;   KNEE ARTHROSCOPY     KNEE CLOSED REDUCTION  05/07/2015   Procedure: CLOSED MANIPULATION KNEE;  Surgeon: Ozell Flake, MD;  Location: [facility] OR[facility];  Service: Orthopedics;;   KNEE SURGERY Bilateral 2005,2006   Repair   LOWER EXTREMITY ANGIOGRAPHY Right 11/22/2023   Procedure: Lower Extremity Angiography;  Surgeon: Marea Selinda RAMAN, MD;  Location: [facility];  Service: Cardiovascular;  Laterality: Right;   LOWER EXTREMITY ANGIOGRAPHY Left 01/03/2024   Procedure: Lower Extremity Angiography;  Surgeon: Marea Selinda RAMAN, MD;  Location: [facility];  Service: Cardiovascular;   Laterality: Left;   MOHS SURGERY     nose bcc, left cheeck Dr. Loris f/u Q 12/2020   PERIPHERAL VASCULAR CATHETERIZATION  11/22/2023   REPLACEMENT TOTAL KNEE Left 04/11/2015   manipulation--05/07/2015   ROTATOR CUFF REPAIR W/ DISTAL CLAVICLE EXCISION     SHOULDER SURGERY Right    x2   SKIN CANCER EXCISION     TOTAL KNEE ARTHROPLASTY     VAGINAL HYSTERECTOMY      Allergies  Allergies[1]     History of Present Illness      79 y.o. y/o female ith a history of hypertension, hyperlipidemia, diastolic dysfunction, mild mitral regurgitation, PAD, hyponatremia (in setting of hydrochlorothiazide ), and anxiety.  She established care in 11/2021 with Dr. Cave for uncontrolled hypertension with systolic blood pressures ranging from 140-160s at home despite multiple medications with adjustments. She had a renal ultrasound in August 2022 that suggested no evidence of renal artery stenosis so she was started on HCTZ in December 2022. She presented with a systolic murmur on exam, which was evaluated by echocardiogram in January 2023 that suggested an EF of 60-65%, normal LV function, grade 1 diastolic, and mild MR.    In the setting of claudication in early January 2024, she underwent ABIs which were abnormal and suggested at least moderate right lower extremity arterial disease and mild left lower extremity arterial disease.  She subsequently underwent PTA of the right anterior tibial, atherectomy of the right SFA  and above-the-knee popliteal artery, and PTA and drug-coated balloon angioplasty of the distal right SFA and proximal popliteal in December 2024.  In January 2025, she underwent PTA and drug-coated balloon angioplasty and stenting of the left distal SFA.   Over the past year Kristen Powell has generally felt well but has noted some progression in baseline level of dyspnea on exertion.  She said that walking in the clinic today, because it the parking lot has an incline, and that she had to stop  about halfway and rest.  She does not experience chest pain.  Ambulation overall is somewhat limited or complicated by left foot pain and unsteadiness, though she has not fallen in quite some time.  She denies palpitations, PND, orthopnea, dizziness, syncope, edema, or early satiety. Objective   Home Medications    Current Outpatient Medications  Medication Sig Dispense Refill   acetaminophen  (TYLENOL ) 500 MG tablet Take 1-2 tablets (500-1,000 mg total) by mouth every 4 (four) hours as needed for fever. 30 tablet 0   amLODipine  (NORVASC ) 10 MG tablet TAKE 1 TABLET BY MOUTH EVERY DAY 90 tablet 1   aspirin  81 MG tablet Take 81 mg by mouth daily.     atorvastatin  (LIPITOR) 40 MG tablet Take 1 tablet (40 mg total) by mouth daily. 90 tablet 3   carvedilol  (COREG ) 12.5 MG tablet Take 1 tablet (12.5 mg total) by mouth 2 (two) times daily with a meal. 180 tablet 0   Cholecalciferol  (VITAMIN D ) 2000 UNITS CAPS Take by mouth daily. Patient is taking 3000 units instead of 2000 (Patient taking differently: Take by mouth daily.)     citalopram  (CELEXA ) 10 MG tablet TAKE 1 TABLET BY MOUTH EVERY DAY 90 tablet 3   clopidogrel  (PLAVIX ) 75 MG tablet TAKE 1 TABLET BY MOUTH EVERY DAY 90 tablet 3   fenofibrate  (TRICOR ) 145 MG tablet TAKE 1 TABLET BY MOUTH EVERY DAY 90 tablet 3   gabapentin  (NEURONTIN ) 100 MG capsule TAKE 1 CAPSULE BY MOUTH AT BEDTIME. 90 capsule 1   LORazepam  (ATIVAN ) 0.5 MG tablet Take 0.5-1 tablets (0.25-0.5 mg total) by mouth daily as needed for anxiety. 30 tablet 2   losartan  (COZAAR ) 100 MG tablet TAKE 1 TABLET (100 MG TOTAL) BY MOUTH DAILY. D/C 50 MG 90 tablet 1   Omega-3 Fatty Acids (FISH OIL) 1200 MG CAPS Take 1 capsule by mouth 2 (two) times daily.     vitamin B-12 (CYANOCOBALAMIN ) 1000 MCG tablet Take 1,000 mcg by mouth daily.     calcium  carbonate (OS-CAL) 600 MG TABS tablet Take 600 mg by mouth daily as needed. (Patient not taking: Reported on 12/21/2024)     No current  facility-administered medications for this visit.     Physical Exam    VS:  BP (!) 138/50 (BP Location: Left Arm, Patient Position: Sitting, Cuff Size: Normal)   Pulse 60   Ht 4' 11 (1.499 m)   Wt 163 lb (73.9 kg)   SpO2 98%   BMI 32.92 kg/m  , BMI Body mass index is 32.92 kg/m.          GEN: Well nourished, well developed, in no acute distress. HEENT: normal. Neck: Supple, no JVD or masses.  Left greater than right bilateral carotid bruits noted. Cardiac: RRR, 3/6 systolic ejection murmur loudest at the upper sternal borders but heard throughout. No clubbing, cyanosis, edema.  Radials 2+/PT 2+ and equal bilaterally.  Respiratory:  Respirations regular and unlabored, clear to auscultation bilaterally. GI: Soft, nontender, nondistended,  BS + x 4. MS: no deformity or atrophy. Skin: warm and dry, no rash. Neuro:  Strength and sensation are intact. Psych: Normal affect.  Accessory Clinical Findings    ECG personally reviewed by me today - EKG Interpretation Date/Time:  Thursday December 21 2024 08:54:40 EST Ventricular Rate:  60 PR Interval:  160 QRS Duration:  76 QT Interval:  414 QTC Calculation: 414 R Axis:   -21  Text Interpretation: Normal sinus rhythm Minimal voltage criteria for LVH, may be normal variant ( R in aVL ) Confirmed by Vivienne Bruckner [phone]) on 12/21/2024 9:08:13 AM  - no acute changes.  Lab Results  Component Value Date   WBC 6.3 11/18/2023   HGB 11.4 (L) 11/18/2023   HCT 33.0 (L) 11/18/2023   MCV 98.7 11/18/2023   PLT 269.0 11/18/2023   Lab Results  Component Value Date   CREATININE 0.98 12/12/2024   BUN 22 12/12/2024   NA 134 (L) 12/12/2024   K 5.4 No hemolysis seen (H) 12/12/2024   CL 102 12/12/2024   CO2 27 12/12/2024   Lab Results  Component Value Date   ALT 15 12/12/2024   AST 19 12/12/2024   ALKPHOS 27 (L) 12/12/2024   BILITOT 0.4 12/12/2024   Lab Results  Component Value Date   CHOL 137 12/12/2024   HDL 48.70 12/12/2024    LDLCALC 65 12/12/2024   LDLDIRECT 123.0 04/29/2021   TRIG 117.0 12/12/2024   CHOLHDL 3 12/12/2024    Lab Results  Component Value Date   HGBA1C 5.9 12/12/2024   Lab Results  Component Value Date   TSH 3.18 02/26/2022       Assessment & Plan    1.  Dyspnea on exertion: Patient with some degree of chronic dyspnea on exertion but has noted some progression over the past year or more.  She has trouble with inclines and had to stop and take a break while walking into the office today.  She is euvolemic on examination.  Known history of systolic murmur and mild MR.  Follow-up echocardiogram.  We did discuss that given her history of PAD, I would have a low threshold to pursue ischemic testing to rule out dyspnea representing an anginal equivalent.  We agreed to obtain echocardiogram first and will reevaluate at follow-up and determine if ischemic testing is warranted.  2.  Primary hypertension: Blood pressures trending in the 130s at home.  Encouraged more regular exercise and limiting salt intake as she admitted to eating a lot of processed food over the holidays.  She remains on amlodipine , carvedilol , and losartan .  3.  Hyperlipidemia: LDL of 65 in December with normal LFTs.  Continue statin therapy.  4.  Mitral regurgitation: Mild mitral regurgitation by echocardiogram in January 2023.  Harsh systolic murmur on examination of the upper sternal borders, not necessarily consistent with MR.  Follow-up echo.  5.  Peripheral arterial disease: Status post bilateral lower extremity interventions.  Followed by vascular surgery with normal ABIs in late December.  She denies any claudication today.  She remains on aspirin , clopidogrel , and statin therapy.  6.  Carotid bruits: Left greater than right bilateral carotid bruits noted on examination.  I will arrange for carotid ultrasound.  She remains on aspirin , clopidogrel , and statin therapy.  7.  Disposition: Follow-up echocardiogram and carotid  ultrasound.  Follow-up in clinic in 3 months or sooner if necessary.  Bruckner Vivienne, NP 12/21/2024, 4:41 PM     [1]  Allergies Allergen Reactions  Hydrochlorothiazide  Other (See Comments)    Hyponatremia    Adhesive [Tape] Rash    redness   "

## 2024-12-28 ENCOUNTER — Telehealth: Payer: Self-pay | Admitting: Podiatry

## 2024-12-28 ENCOUNTER — Other Ambulatory Visit

## 2024-12-28 DIAGNOSIS — I10 Essential (primary) hypertension: Secondary | ICD-10-CM

## 2024-12-28 LAB — BASIC METABOLIC PANEL WITH GFR
BUN: 20 mg/dL (ref 6–23)
CO2: 28 meq/L (ref 19–32)
Calcium: 8.8 mg/dL (ref 8.4–10.5)
Chloride: 105 meq/L (ref 96–112)
Creatinine, Ser: 0.92 mg/dL (ref 0.40–1.20)
GFR: 59.46 mL/min — ABNORMAL LOW
Glucose, Bld: 92 mg/dL (ref 70–99)
Potassium: 4.9 meq/L (ref 3.5–5.1)
Sodium: 137 meq/L (ref 135–145)

## 2024-12-28 NOTE — Telephone Encounter (Signed)
 Called and scheduled patient for surgery on 01/18/2025. Patient not on any GLP1 but does take 81 mg aspirin  and has been advised to d/c use 7 days prior to surgery. Patient pharmacy correct in chart.

## 2024-12-29 ENCOUNTER — Telehealth: Payer: Self-pay

## 2024-12-29 ENCOUNTER — Ambulatory Visit: Payer: Self-pay | Admitting: Family

## 2024-12-29 NOTE — Telephone Encounter (Signed)
 Copied from CRM [id number]. Topic: Clinical - Lab/Test Results >> Dec 28, 2024  4:01 PM Alfonso ORN wrote: Reason for CRM: pt called to get results for bloodwork. Please call to update.

## 2025-01-01 NOTE — Telephone Encounter (Signed)
 Pt given results via mychart and Kristen Powell has sent fpl group that pt has read.  No there actions to take at this time.

## 2025-01-04 ENCOUNTER — Telehealth: Payer: Self-pay | Admitting: Nurse Practitioner

## 2025-01-04 MED ORDER — CARVEDILOL 12.5 MG PO TABS: 12.5000 mg | ORAL_TABLET | Freq: Two times a day (BID) | ORAL | 3 refills | Status: AC

## 2025-01-04 NOTE — Telephone Encounter (Signed)
" °*  STAT* If patient is at the pharmacy, call can be transferred to refill team.   1. Which medications need to be refilled? (please list name of each medication and dose if known) Carvedilol  12.5mg    2. Would you like to learn more about the convenience, safety, & potential cost savings by using the [facility]? N/a   3. Are you open to using the [facility] (Type [facility]..n/a   4. Which pharmacy/location (including street and city if local pharmacy) is medication to be sent to?[facility] [address]   5. Do they need a 30 day or 90 day supply? 90 day  "

## 2025-01-09 ENCOUNTER — Ambulatory Visit

## 2025-01-09 ENCOUNTER — Telehealth: Payer: Self-pay | Admitting: Cardiology

## 2025-01-09 ENCOUNTER — Ambulatory Visit (INDEPENDENT_AMBULATORY_CARE_PROVIDER_SITE_OTHER)

## 2025-01-09 ENCOUNTER — Other Ambulatory Visit: Payer: Self-pay | Admitting: Nurse Practitioner

## 2025-01-09 DIAGNOSIS — R0989 Other specified symptoms and signs involving the circulatory and respiratory systems: Secondary | ICD-10-CM | POA: Insufficient documentation

## 2025-01-09 DIAGNOSIS — I340 Nonrheumatic mitral (valve) insufficiency: Secondary | ICD-10-CM

## 2025-01-09 DIAGNOSIS — R0609 Other forms of dyspnea: Secondary | ICD-10-CM | POA: Insufficient documentation

## 2025-01-09 DIAGNOSIS — I10 Essential (primary) hypertension: Secondary | ICD-10-CM

## 2025-01-09 DIAGNOSIS — I739 Peripheral vascular disease, unspecified: Secondary | ICD-10-CM

## 2025-01-09 DIAGNOSIS — E785 Hyperlipidemia, unspecified: Secondary | ICD-10-CM

## 2025-01-09 LAB — ECHOCARDIOGRAM COMPLETE
AR max vel: 1.71 cm2
AV Area VTI: 1.7 cm2
AV Area mean vel: 1.63 cm2
AV Mean grad: 9 mmHg
AV Peak grad: 16.2 mmHg
Ao pk vel: 2.02 m/s
Area-P 1/2: 3.77 cm2
Calc EF: 57.7 %
S' Lateral: 3.1 cm
Single Plane A2C EF: 59.7 %
Single Plane A4C EF: 55 %

## 2025-01-09 NOTE — Telephone Encounter (Signed)
 Pt would like a c/b regarding recent results, please advise.

## 2025-01-10 ENCOUNTER — Ambulatory Visit: Payer: Self-pay | Admitting: Nurse Practitioner

## 2025-01-10 NOTE — Telephone Encounter (Signed)
 Spoke with patient via phone call about results. Results have not yet been interpreted and sent out to my chart yet, patient made aware it may take a couple of days to receive the results on my chart or phone call. Patient verbalized understanding. No further needs at this time.

## 2025-01-12 ENCOUNTER — Ambulatory Visit: Attending: Nurse Practitioner | Admitting: Nurse Practitioner

## 2025-01-12 ENCOUNTER — Encounter: Payer: Self-pay | Admitting: Nurse Practitioner

## 2025-01-12 VITALS — BP 148/60 | HR 68 | Ht 59.0 in | Wt 165.5 lb

## 2025-01-12 DIAGNOSIS — I340 Nonrheumatic mitral (valve) insufficiency: Secondary | ICD-10-CM | POA: Diagnosis present

## 2025-01-12 DIAGNOSIS — R0989 Other specified symptoms and signs involving the circulatory and respiratory systems: Secondary | ICD-10-CM | POA: Diagnosis present

## 2025-01-12 DIAGNOSIS — I272 Pulmonary hypertension, unspecified: Secondary | ICD-10-CM | POA: Diagnosis present

## 2025-01-12 DIAGNOSIS — E785 Hyperlipidemia, unspecified: Secondary | ICD-10-CM | POA: Insufficient documentation

## 2025-01-12 DIAGNOSIS — I70219 Atherosclerosis of native arteries of extremities with intermittent claudication, unspecified extremity: Secondary | ICD-10-CM | POA: Insufficient documentation

## 2025-01-12 DIAGNOSIS — R0609 Other forms of dyspnea: Secondary | ICD-10-CM | POA: Insufficient documentation

## 2025-01-12 DIAGNOSIS — I10 Essential (primary) hypertension: Secondary | ICD-10-CM | POA: Insufficient documentation

## 2025-01-12 MED ORDER — FUROSEMIDE 20 MG PO TABS: 20.0000 mg | ORAL_TABLET | Freq: Every day | ORAL | 3 refills | Status: AC

## 2025-01-12 NOTE — Progress Notes (Signed)
 "    Office Visit    Patient Name: Kristen Powell Date of Encounter: 01/12/2025  Primary Care Provider:  Dineen Rollene MATSU, FNP Primary Cardiologist:  Kristen Cave, MD    Chief Complaint    79 y.o. female with a history of hypertension, hyperlipidemia, diastolic dysfunction, mild mitral regurgitation, PAD, hyponatremia (in setting of hydrochlorothiazide  therapy), and anxiety, presents for follow-up related to dyspnea and pulmonary hypertension.  Past Medical History   Subjective   Past Medical History:  Diagnosis Date   Anxiety    COVID-19    Diastolic dysfunction    a. 12/2021 Echo: EF 60-65%, no rwma, GrI DD, nl RV fxn, RVSP 36.79mmHg, mild MR; b. 12/2024 Echo: EF 60-65%, GrI DD, no rwma, nl RV fxn, RVSP , mod dil LA, mod MR, mild-mod TR, AoV sclerosis w/o stenosis.   Hyperlipidemia    Hypertension    Mild Mitral regurgitation    a. 12/2021 Echo: Mild MR; b. 12/2024 Echo: Mod MR.   PAD (peripheral artery disease)    a. 11/2023 s/p RLE PTA; b. 12/2023 s/p L distal SFA stenting; c. 11/2024 ABIs: R 1.01, L 1.01.   Psoriasis    younger age   Pulmonary hypertension (HCC)    a. 12/2024 Echo: EF 60-65%, GrI DD, RVSP .   Spinal stenosis    Traumatic rupture of left ear drum    Past Surgical History:  Procedure Laterality Date   ABDOMINAL HYSTERECTOMY  12/14/1978   Partial   BASAL CELL CARCINOMA EXCISION  2006,2008,2011   BUNIONECTOMY     BUNIONECTOMY Bilateral 12/14/1990   COLONOSCOPY WITH PROPOFOL  N/A 12/09/2021   Procedure: COLONOSCOPY WITH PROPOFOL ;  Surgeon: Kristen Carmine, MD;  Location: [facility] ENDOSCOPY;  Service: Endoscopy;  Laterality: N/A;   KNEE ARTHROSCOPY     KNEE CLOSED REDUCTION  05/07/2015   Procedure: CLOSED MANIPULATION KNEE;  Surgeon: Kristen Flake, MD;  Location: [facility] ORS;  Service: Orthopedics;;   KNEE SURGERY Bilateral 2005,2006   Repair   LOWER EXTREMITY ANGIOGRAPHY Right 11/22/2023   Procedure: Lower Extremity Angiography;  Surgeon: Kristen Selinda RAMAN, MD;  Location: [facility] INVASIVE CV LAB;  Service: Cardiovascular;  Laterality: Right;   LOWER EXTREMITY ANGIOGRAPHY Left 01/03/2024   Procedure: Lower Extremity Angiography;  Surgeon: Kristen Selinda RAMAN, MD;  Location: [facility] INVASIVE CV LAB;  Service: Cardiovascular;  Laterality: Left;   MOHS SURGERY     nose bcc, left cheeck Dr. Loris f/u Q 12/2020   PERIPHERAL VASCULAR CATHETERIZATION  11/22/2023   REPLACEMENT TOTAL KNEE Left 04/11/2015   manipulation--05/07/2015   ROTATOR CUFF REPAIR W/ DISTAL CLAVICLE EXCISION     SHOULDER SURGERY Right    x2   SKIN CANCER EXCISION     TOTAL KNEE ARTHROPLASTY     VAGINAL HYSTERECTOMY      Allergies  Allergies[1]     History of Present Illness      79 y.o. y/o female with a history of hypertension, hyperlipidemia, diastolic dysfunction, mild mitral regurgitation, PAD, hyponatremia (in setting of hydrochlorothiazide ), and anxiety.  She established care in 11/2021 with Kristen Powell for uncontrolled hypertension with systolic blood pressures ranging from 140-160s at home despite multiple medications with adjustments. She had a renal ultrasound in August 2022 that suggested no evidence of renal artery stenosis so she was started on HCTZ in December 2022. She presented with a systolic murmur on exam, which was evaluated by echocardiogram in January 2023 that suggested an EF of 60-65%, normal LV function, grade 1 diastolic, and  mild MR.    In the setting of claudication in early January 2024, she underwent ABIs which were abnormal and suggested at least moderate right lower extremity arterial disease and mild left lower extremity arterial disease.  She subsequently underwent PTA of the right anterior tibial, atherectomy of the right SFA and above-the-knee popliteal artery, and PTA and drug-coated balloon angioplasty of the distal right SFA and proximal popliteal in December 2024.  In January 2025, she underwent PTA and drug-coated balloon angioplasty and  stenting of the left distal SFA.     Ms. Kristen Powell was seen in cardiology clinic in early January 2026 at which time she reported progression of dyspnea on exertion without chest pain.  She had to stop in our parking lot to catch her breath that day.  Echocardiogram was obtained and showed an EF of 60 to 65% with grade 1 diastolic dysfunction, normal RV function, RVSP of 51 mmHg, moderate MR, mild to moderate TR, and aortic valve sclerosis.  Today, Ms. Kristen Powell notes ongoing dyspnea on exertion, which is somewhat lifestyle limiting.  She again denies chest pain.  She has some degree of chronic leg pain, particularly left, as well as left foot pain.  She has been followed by podiatry with plan for left foot surgery in the near future.  She states that since her last visit, she has noticed some increased abdominal girth and notes that her weight is up 2 pounds.  She denies palpitations, PND, orthopnea, dizziness, syncope, or early satiety.  On further questioning today, she notes that she does sometimes experience ankle edema. Objective   Home Medications    Current Outpatient Medications  Medication Sig Dispense Refill   acetaminophen  (TYLENOL ) 500 MG tablet Take 1-2 tablets (500-1,000 mg total) by mouth every 4 (four) hours as needed for fever. 30 tablet 0   amLODipine  (NORVASC ) 10 MG tablet TAKE 1 TABLET BY MOUTH EVERY DAY 90 tablet 1   atorvastatin  (LIPITOR) 40 MG tablet Take 1 tablet (40 mg total) by mouth daily. 90 tablet 3   carvedilol  (COREG ) 12.5 MG tablet Take 1 tablet (12.5 mg total) by mouth 2 (two) times daily with a meal. 180 tablet 3   Cholecalciferol  (VITAMIN D ) 2000 UNITS CAPS Take by mouth daily. Patient is taking 3000 units instead of 2000 (Patient taking differently: Take by mouth daily.)     citalopram  (CELEXA ) 10 MG tablet TAKE 1 TABLET BY MOUTH EVERY DAY 90 tablet 3   clopidogrel  (PLAVIX ) 75 MG tablet TAKE 1 TABLET BY MOUTH EVERY DAY 90 tablet 3   fenofibrate  (TRICOR ) 145 MG tablet  TAKE 1 TABLET BY MOUTH EVERY DAY 90 tablet 3   gabapentin  (NEURONTIN ) 100 MG capsule TAKE 1 CAPSULE BY MOUTH AT BEDTIME. 90 capsule 1   LORazepam  (ATIVAN ) 0.5 MG tablet Take 0.5-1 tablets (0.25-0.5 mg total) by mouth daily as needed for anxiety. 30 tablet 2   losartan  (COZAAR ) 100 MG tablet TAKE 1 TABLET (100 MG TOTAL) BY MOUTH DAILY. D/C 50 MG 90 tablet 1   Omega-3 Fatty Acids (FISH OIL) 1200 MG CAPS Take 1 capsule by mouth 2 (two) times daily.     vitamin B-12 (CYANOCOBALAMIN ) 1000 MCG tablet Take 1,000 mcg by mouth daily.     aspirin  81 MG tablet Take 81 mg by mouth daily. (Patient not taking: Reported on 01/12/2025)     calcium  carbonate (OS-CAL) 600 MG TABS tablet Take 600 mg by mouth daily as needed. (Patient not taking: Reported on 01/12/2025)  No current facility-administered medications for this visit.    Family History    Family History  Problem Relation Age of Onset   Cancer Mother        lung   Hypertension Sister    Heart attack Sister 39   Breast cancer Paternal Aunt     Social History    Social History   Socioeconomic History   Marital status: Widowed    Spouse name: Not on file   Number of children: 3   Years of education: Not on file   Highest education level: Not on file  Occupational History   Not on file  Tobacco Use   Smoking status: Never   Smokeless tobacco: Never  Vaping Use   Vaping status: Never Used  Substance and Sexual Activity   Alcohol use: No   Drug use: No   Sexual activity: Never  Other Topics Concern   Not on file  Social History Narrative   Widowed x 15 years as of 06/2021    3 sisters and 1 brother    Has children son age 95, daughter 56, son 85as of 11/2023   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      45 great grandchildren         Social Drivers of Health   Tobacco Use: Low Risk (01/12/2025)   Patient History    Smoking Tobacco Use: Never    Smokeless Tobacco Use: Never    Passive Exposure: Not on file  Financial Resource  Strain: Low Risk (08/29/2024)   Overall Financial Resource Strain (CARDIA)    Difficulty of Paying Living Expenses: Not hard at all  Food Insecurity: No Food Insecurity (08/29/2024)   Epic    Worried About Programme Researcher, Broadcasting/film/video in the Last Year: Never true    Ran Out of Food in the Last Year: Never true  Transportation Needs: No Transportation Needs (08/29/2024)   Epic    Lack of Transportation (Medical): No    Lack of Transportation (Non-Medical): No  Physical Activity: Insufficiently Active (08/29/2024)   Exercise Vital Sign    Days of Exercise per Week: 3 days    Minutes of Exercise per Session: 30 min  Stress: No Stress Concern Present (08/29/2024)   [employer] of Occupational Health - Occupational Stress Questionnaire    Feeling of Stress: Not at all  Social Connections: Moderately Integrated (08/29/2024)   Social Connection and Isolation Panel    Frequency of Communication with Friends and Family: More than three times a week    Frequency of Social Gatherings with Friends and Family: More than three times a week    Attends Religious Services: More than 4 times per year    Active Member of [employer] or Organizations: Yes    Attends Banker Meetings: More than 4 times per year    Marital Status: Widowed  Intimate Partner Violence: Not At Risk (08/29/2024)   Epic    Fear of Current or Ex-Partner: No    Emotionally Abused: No    Physically Abused: No    Sexually Abused: No  Depression (PHQ2-9): Low Risk (10/31/2024)   Depression (PHQ2-9)    PHQ-2 Score: 0  Alcohol Screen: Low Risk (08/29/2024)   Alcohol Screen    Last Alcohol Screening Score (AUDIT): 0  Housing: Unknown (08/29/2024)   Epic    Unable to Pay for Housing in the Last Year: No    Number of Times Moved in the Last Year: Not  on file    Homeless in the Last Year: No  Utilities: Not At Risk (08/29/2024)   Epic    Threatened with loss of utilities: No  Health Literacy: Adequate Health Literacy (08/29/2024)    B1300 Health Literacy    Frequency of need for help with medical instructions: Never    Review of Systems    +++ progressive DOE, +++ mild occas ankle swelling, +++ recent inc abd girth.  No chest pain, n, v, dizziness, syncope, early satiety, dysuria, dark stools, blood in stools, diarrhea, rash/skin changes, fevers, chills, wt loss/gain.  Otherwise all systems reviewed and negative.   Physical Exam    VS:  BP (!) 148/60 (BP Location: Left Arm, Patient Position: Sitting, Cuff Size: Normal)   Pulse 68   Ht 4' 11 (1.499 m)   Wt 165 lb 8 oz (75.1 kg)   SpO2 98%   BMI 33.43 kg/m  , BMI Body mass index is 33.43 kg/m.          GEN: Well nourished, well developed, in no acute distress. HEENT: normal. Neck: Supple, no JVD, left greater than right carotid bruits.  No masses. Cardiac: RRR, 3/6 systolic murmur loudest at the upper sternal borders with 2/6 blowing quality systolic murmur at the apex. No clubbing, cyanosis, trace bilateral ankle edema.  Radials 2+/PT 2+ and equal bilaterally.  Respiratory:  Respirations regular and unlabored, clear to auscultation bilaterally. GI: Soft, nontender, nondistended, BS + x 4. MS: no deformity or atrophy. Skin: warm and dry, no rash. Neuro:  Strength and sensation are intact. Psych: Normal affect.  Accessory Clinical Findings    ECG personally reviewed by me today - EKG Interpretation Date/Time:  Friday January 12 2025 11:03:48 EST Ventricular Rate:  68 PR Interval:  176 QRS Duration:  82 QT Interval:  412 QTC Calculation: 438 R Axis:   -19  Text Interpretation: Normal sinus rhythm Minimal voltage criteria for LVH, may be normal variant ( R in aVL ) Confirmed by Vivienne Bruckner [phone]) on 01/12/2025 11:13:55 AM  - no acute changes.  Lab Results  Component Value Date   WBC 6.3 11/18/2023   HGB 11.4 (L) 11/18/2023   HCT 33.0 (L) 11/18/2023   MCV 98.7 11/18/2023   PLT 269.0 11/18/2023   Lab Results  Component Value Date    CREATININE 0.92 12/28/2024   BUN 20 12/28/2024   NA 137 12/28/2024   K 4.9 12/28/2024   CL 105 12/28/2024   CO2 28 12/28/2024   Lab Results  Component Value Date   ALT 15 12/12/2024   AST 19 12/12/2024   ALKPHOS 27 (L) 12/12/2024   BILITOT 0.4 12/12/2024   Lab Results  Component Value Date   CHOL 137 12/12/2024   HDL 48.70 12/12/2024   LDLCALC 65 12/12/2024   LDLDIRECT 123.0 04/29/2021   TRIG 117.0 12/12/2024   CHOLHDL 3 12/12/2024    Lab Results  Component Value Date   HGBA1C 5.9 12/12/2024   Lab Results  Component Value Date   TSH 3.18 02/26/2022       Assessment & Plan    1.  Dyspnea on exertion/pulmonary hypertension: Patient seen this month with complaints of progressive dyspnea on exertion.  She does not experience chest pain.  I obtained echocardiogram that showed an EF of 60 to 65% with grade 1 diastolic dysfunction, normal RV function, RVSP 51 mmHg, moderate MR, mild to moderate TR, and aortic valve sclerosis.  In light of progressive dyspnea and evidence of  pulmonary hypertension, we called Ms. Jankowiak and asked her to come back to be reevaluated for consideration of diagnostic catheterization.  She notes ongoing dyspnea on exertion which is lifestyle limiting and since her last visit, has also noted a 2 pound weight gain and some increase in abdominal girth.  Today she also reports occasional ankle swelling.  We discussed her echocardiogram findings in detail.  Body habitus makes exam somewhat challenging but she is not particularly volume overloaded on examination.  In the setting of peripheral vascular and carotid arterial disease, I am concerned that her dyspnea may be an anginal equivalent.  In light of progressive symptoms and finding of pulmonary hypertension on echo, we agreed to pursue a right and left heart diagnostic catheterization.  I am adding Lasix  20 mg daily today.  She does have a history of hyponatremia on HCTZ and as such, we will follow-up lab work  early next week, prior to her diagnostic catheterization.  She otherwise remains on aspirin , clopidogrel , statin, beta-blocker, ARB, and calcium  channel blocker therapy. Informed Consent   Shared Decision Making/Informed Consent The risks, including but not limited to, [bleeding or vascular complications (1 in 500), pneumothorax (1 in 1600), arrhythmia (1 in 1000) and death (1 in 5000)], benefits (diagnostic support and/or management of heart failure, pulmonary hypertension) and alternatives of a right heart catheterization were discussed in detail with Ms. Yau and she is willing to proceed. The risks [stroke (1 in 1000), death (1 in 1000), kidney failure [usually temporary] (1 in 500), bleeding (1 in 200), allergic reaction [possibly serious] (1 in 200)], benefits (diagnostic support and management of coronary artery disease) and alternatives of a left heart cardiac catheterization were discussed in detail with Ms. Rynders and she is willing to proceed.      2.  Primary hypertension: Blood pressure elevated today 148/60.  Adding low-dose Lasix  as outlined above in the setting of elevated right heart pressures.  3.  Hyperlipidemia: LDL of 65 in December 2025 with normal LFTs.  Continue statin therapy.  4.  Moderate mitral regurgitation/aortic sclerosis: Recent echo showed slight progression of MR to moderate.  She also has aortic valve sclerosis without stenosis, which I suspect is the predominant cause of her loud systolic murmur.  She does have a softer blowing quality murmur at her apex as well.  5.  Peripheral arterial disease/carotid arterial disease: Status post bilateral lower extremity interventions.  Followed by vascular surgery with normal ABIs in December 2025.  In the setting of bilateral carotid bruits, I obtained a carotid ultrasound since her last visit, which did show moderate 40-59% bilateral internal carotid artery disease.  She has chronically been on dual antiplatelet therapy as  well as statin.  She will continue to follow-up with vascular surgery.  6.  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  Ms. Xin I discussed this today.  She believes that the surgery will be performed under general anesthesia but is not sure.  We discussed that in light of dyspnea exertion and pulmonary hypertension on recent echo, that surgery should be delayed until cardiac workup complete.  She plans to reach out to her podiatrist and I will copy my note to him as well.  7.  Disposition: Follow-up CBC and basic metabolic panel early next week.  Diagnostic catheterization next week with outpatient follow-up 2 weeks later.  Lonni Meager, NP 01/12/2025, 11:15 AM     [1]  Allergies Allergen  Reactions   Hydrochlorothiazide  Other (See Comments)    Hyponatremia    Adhesive [Tape] Rash    redness   "

## 2025-01-12 NOTE — H&P (View-Only) (Signed)
 "    Office Visit    Patient Name: Kristen Powell Date of Encounter: 01/12/2025  Primary Care Provider:  Dineen Rollene MATSU, FNP Primary Cardiologist:  Kristen Cave, MD    Chief Complaint    79 y.o. female with a history of hypertension, hyperlipidemia, diastolic dysfunction, mild mitral regurgitation, PAD, hyponatremia (in setting of hydrochlorothiazide  therapy), and anxiety, presents for follow-up related to dyspnea and pulmonary hypertension.  Past Medical History   Subjective   Past Medical History:  Diagnosis Date   Anxiety    COVID-19    Diastolic dysfunction    a. 12/2021 Echo: EF 60-65%, no rwma, GrI DD, nl RV fxn, RVSP 36.79mmHg, mild MR; b. 12/2024 Echo: EF 60-65%, GrI DD, no rwma, nl RV fxn, RVSP , mod dil LA, mod MR, mild-mod TR, AoV sclerosis w/o stenosis.   Hyperlipidemia    Hypertension    Mild Mitral regurgitation    a. 12/2021 Echo: Mild MR; b. 12/2024 Echo: Mod MR.   PAD (peripheral artery disease)    a. 11/2023 s/p RLE PTA; b. 12/2023 s/p L distal SFA stenting; c. 11/2024 ABIs: R 1.01, L 1.01.   Psoriasis    younger age   Pulmonary hypertension (HCC)    a. 12/2024 Echo: EF 60-65%, GrI DD, RVSP .   Spinal stenosis    Traumatic rupture of left ear drum    Past Surgical History:  Procedure Laterality Date   ABDOMINAL HYSTERECTOMY  12/14/1978   Partial   BASAL CELL CARCINOMA EXCISION  2006,2008,2011   BUNIONECTOMY     BUNIONECTOMY Bilateral 12/14/1990   COLONOSCOPY WITH PROPOFOL  N/A 12/09/2021   Procedure: COLONOSCOPY WITH PROPOFOL ;  Surgeon: Kristen Carmine, MD;  Location: [facility] ENDOSCOPY;  Service: Endoscopy;  Laterality: N/A;   KNEE ARTHROSCOPY     KNEE CLOSED REDUCTION  05/07/2015   Procedure: CLOSED MANIPULATION KNEE;  Surgeon: Kristen Flake, MD;  Location: [facility] ORS;  Service: Orthopedics;;   KNEE SURGERY Bilateral 2005,2006   Repair   LOWER EXTREMITY ANGIOGRAPHY Right 11/22/2023   Procedure: Lower Extremity Angiography;  Surgeon: Kristen Selinda RAMAN, MD;  Location: [facility] INVASIVE CV LAB;  Service: Cardiovascular;  Laterality: Right;   LOWER EXTREMITY ANGIOGRAPHY Left 01/03/2024   Procedure: Lower Extremity Angiography;  Surgeon: Kristen Selinda RAMAN, MD;  Location: [facility] INVASIVE CV LAB;  Service: Cardiovascular;  Laterality: Left;   MOHS SURGERY     nose bcc, left cheeck Dr. Loris f/u Q 12/2020   PERIPHERAL VASCULAR CATHETERIZATION  11/22/2023   REPLACEMENT TOTAL KNEE Left 04/11/2015   manipulation--05/07/2015   ROTATOR CUFF REPAIR W/ DISTAL CLAVICLE EXCISION     SHOULDER SURGERY Right    x2   SKIN CANCER EXCISION     TOTAL KNEE ARTHROPLASTY     VAGINAL HYSTERECTOMY      Allergies  Allergies[1]     History of Present Illness      79 y.o. y/o female with a history of hypertension, hyperlipidemia, diastolic dysfunction, mild mitral regurgitation, PAD, hyponatremia (in setting of hydrochlorothiazide ), and anxiety.  She established care in 11/2021 with Dr. Cave for uncontrolled hypertension with systolic blood pressures ranging from 140-160s at home despite multiple medications with adjustments. She had a renal ultrasound in August 2022 that suggested no evidence of renal artery stenosis so she was started on HCTZ in December 2022. She presented with a systolic murmur on exam, which was evaluated by echocardiogram in January 2023 that suggested an EF of 60-65%, normal LV function, grade 1 diastolic, and  mild MR.    In the setting of claudication in early January 2024, she underwent ABIs which were abnormal and suggested at least moderate right lower extremity arterial disease and mild left lower extremity arterial disease.  She subsequently underwent PTA of the right anterior tibial, atherectomy of the right SFA and above-the-knee popliteal artery, and PTA and drug-coated balloon angioplasty of the distal right SFA and proximal popliteal in December 2024.  In January 2025, she underwent PTA and drug-coated balloon angioplasty and  stenting of the left distal SFA.     Kristen Powell was seen in cardiology clinic in early January 2026 at which time she reported progression of dyspnea on exertion without chest pain.  She had to stop in our parking lot to catch her breath that day.  Echocardiogram was obtained and showed an EF of 60 to 65% with grade 1 diastolic dysfunction, normal RV function, RVSP of 51 mmHg, moderate MR, mild to moderate TR, and aortic valve sclerosis.  Today, Kristen Powell notes ongoing dyspnea on exertion, which is somewhat lifestyle limiting.  She again denies chest pain.  She has some degree of chronic leg pain, particularly left, as well as left foot pain.  She has been followed by podiatry with plan for left foot surgery in the near future.  She states that since her last visit, she has noticed some increased abdominal girth and notes that her weight is up 2 pounds.  She denies palpitations, PND, orthopnea, dizziness, syncope, or early satiety.  On further questioning today, she notes that she does sometimes experience ankle edema. Objective   Home Medications    Current Outpatient Medications  Medication Sig Dispense Refill   acetaminophen  (TYLENOL ) 500 MG tablet Take 1-2 tablets (500-1,000 mg total) by mouth every 4 (four) hours as needed for fever. 30 tablet 0   amLODipine  (NORVASC ) 10 MG tablet TAKE 1 TABLET BY MOUTH EVERY DAY 90 tablet 1   atorvastatin  (LIPITOR) 40 MG tablet Take 1 tablet (40 mg total) by mouth daily. 90 tablet 3   carvedilol  (COREG ) 12.5 MG tablet Take 1 tablet (12.5 mg total) by mouth 2 (two) times daily with a meal. 180 tablet 3   Cholecalciferol  (VITAMIN D ) 2000 UNITS CAPS Take by mouth daily. Patient is taking 3000 units instead of 2000 (Patient taking differently: Take by mouth daily.)     citalopram  (CELEXA ) 10 MG tablet TAKE 1 TABLET BY MOUTH EVERY DAY 90 tablet 3   clopidogrel  (PLAVIX ) 75 MG tablet TAKE 1 TABLET BY MOUTH EVERY DAY 90 tablet 3   fenofibrate  (TRICOR ) 145 MG tablet  TAKE 1 TABLET BY MOUTH EVERY DAY 90 tablet 3   gabapentin  (NEURONTIN ) 100 MG capsule TAKE 1 CAPSULE BY MOUTH AT BEDTIME. 90 capsule 1   LORazepam  (ATIVAN ) 0.5 MG tablet Take 0.5-1 tablets (0.25-0.5 mg total) by mouth daily as needed for anxiety. 30 tablet 2   losartan  (COZAAR ) 100 MG tablet TAKE 1 TABLET (100 MG TOTAL) BY MOUTH DAILY. D/C 50 MG 90 tablet 1   Omega-3 Fatty Acids (FISH OIL) 1200 MG CAPS Take 1 capsule by mouth 2 (two) times daily.     vitamin B-12 (CYANOCOBALAMIN ) 1000 MCG tablet Take 1,000 mcg by mouth daily.     aspirin  81 MG tablet Take 81 mg by mouth daily. (Patient not taking: Reported on 01/12/2025)     calcium  carbonate (OS-CAL) 600 MG TABS tablet Take 600 mg by mouth daily as needed. (Patient not taking: Reported on 01/12/2025)  No current facility-administered medications for this visit.    Family History    Family History  Problem Relation Age of Onset   Cancer Mother        lung   Hypertension Sister    Heart attack Sister 39   Breast cancer Paternal Aunt     Social History    Social History   Socioeconomic History   Marital status: Widowed    Spouse name: Not on file   Number of children: 3   Years of education: Not on file   Highest education level: Not on file  Occupational History   Not on file  Tobacco Use   Smoking status: Never   Smokeless tobacco: Never  Vaping Use   Vaping status: Never Used  Substance and Sexual Activity   Alcohol use: No   Drug use: No   Sexual activity: Never  Other Topics Concern   Not on file  Social History Narrative   Widowed x 15 years as of 06/2021    3 sisters and 1 brother    Has children son age 95, daughter 56, son 85as of 11/2023   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      45 great grandchildren         Social Drivers of Health   Tobacco Use: Low Risk (01/12/2025)   Patient History    Smoking Tobacco Use: Never    Smokeless Tobacco Use: Never    Passive Exposure: Not on file  Financial Resource  Strain: Low Risk (08/29/2024)   Overall Financial Resource Strain (CARDIA)    Difficulty of Paying Living Expenses: Not hard at all  Food Insecurity: No Food Insecurity (08/29/2024)   Epic    Worried About Programme Researcher, Broadcasting/film/video in the Last Year: Never true    Ran Out of Food in the Last Year: Never true  Transportation Needs: No Transportation Needs (08/29/2024)   Epic    Lack of Transportation (Medical): No    Lack of Transportation (Non-Medical): No  Physical Activity: Insufficiently Active (08/29/2024)   Exercise Vital Sign    Days of Exercise per Week: 3 days    Minutes of Exercise per Session: 30 min  Stress: No Stress Concern Present (08/29/2024)   [employer] of Occupational Health - Occupational Stress Questionnaire    Feeling of Stress: Not at all  Social Connections: Moderately Integrated (08/29/2024)   Social Connection and Isolation Panel    Frequency of Communication with Friends and Family: More than three times a week    Frequency of Social Gatherings with Friends and Family: More than three times a week    Attends Religious Services: More than 4 times per year    Active Member of [employer] or Organizations: Yes    Attends Banker Meetings: More than 4 times per year    Marital Status: Widowed  Intimate Partner Violence: Not At Risk (08/29/2024)   Epic    Fear of Current or Ex-Partner: No    Emotionally Abused: No    Physically Abused: No    Sexually Abused: No  Depression (PHQ2-9): Low Risk (10/31/2024)   Depression (PHQ2-9)    PHQ-2 Score: 0  Alcohol Screen: Low Risk (08/29/2024)   Alcohol Screen    Last Alcohol Screening Score (AUDIT): 0  Housing: Unknown (08/29/2024)   Epic    Unable to Pay for Housing in the Last Year: No    Number of Times Moved in the Last Year: Not  on file    Homeless in the Last Year: No  Utilities: Not At Risk (08/29/2024)   Epic    Threatened with loss of utilities: No  Health Literacy: Adequate Health Literacy (08/29/2024)    B1300 Health Literacy    Frequency of need for help with medical instructions: Never    Review of Systems    +++ progressive DOE, +++ mild occas ankle swelling, +++ recent inc abd girth.  No chest pain, n, v, dizziness, syncope, early satiety, dysuria, dark stools, blood in stools, diarrhea, rash/skin changes, fevers, chills, wt loss/gain.  Otherwise all systems reviewed and negative.   Physical Exam    VS:  BP (!) 148/60 (BP Location: Left Arm, Patient Position: Sitting, Cuff Size: Normal)   Pulse 68   Ht 4' 11 (1.499 m)   Wt 165 lb 8 oz (75.1 kg)   SpO2 98%   BMI 33.43 kg/m  , BMI Body mass index is 33.43 kg/m.          GEN: Well nourished, well developed, in no acute distress. HEENT: normal. Neck: Supple, no JVD, left greater than right carotid bruits.  No masses. Cardiac: RRR, 3/6 systolic murmur loudest at the upper sternal borders with 2/6 blowing quality systolic murmur at the apex. No clubbing, cyanosis, trace bilateral ankle edema.  Radials 2+/PT 2+ and equal bilaterally.  Respiratory:  Respirations regular and unlabored, clear to auscultation bilaterally. GI: Soft, nontender, nondistended, BS + x 4. MS: no deformity or atrophy. Skin: warm and dry, no rash. Neuro:  Strength and sensation are intact. Psych: Normal affect.  Accessory Clinical Findings    ECG personally reviewed by me today - EKG Interpretation Date/Time:  Friday January 12 2025 11:03:48 EST Ventricular Rate:  68 PR Interval:  176 QRS Duration:  82 QT Interval:  412 QTC Calculation: 438 R Axis:   -19  Text Interpretation: Normal sinus rhythm Minimal voltage criteria for LVH, may be normal variant ( R in aVL ) Confirmed by Vivienne Bruckner [phone]) on 01/12/2025 11:13:55 AM  - no acute changes.  Lab Results  Component Value Date   WBC 6.3 11/18/2023   HGB 11.4 (L) 11/18/2023   HCT 33.0 (L) 11/18/2023   MCV 98.7 11/18/2023   PLT 269.0 11/18/2023   Lab Results  Component Value Date    CREATININE 0.92 12/28/2024   BUN 20 12/28/2024   NA 137 12/28/2024   K 4.9 12/28/2024   CL 105 12/28/2024   CO2 28 12/28/2024   Lab Results  Component Value Date   ALT 15 12/12/2024   AST 19 12/12/2024   ALKPHOS 27 (L) 12/12/2024   BILITOT 0.4 12/12/2024   Lab Results  Component Value Date   CHOL 137 12/12/2024   HDL 48.70 12/12/2024   LDLCALC 65 12/12/2024   LDLDIRECT 123.0 04/29/2021   TRIG 117.0 12/12/2024   CHOLHDL 3 12/12/2024    Lab Results  Component Value Date   HGBA1C 5.9 12/12/2024   Lab Results  Component Value Date   TSH 3.18 02/26/2022       Assessment & Plan    1.  Dyspnea on exertion/pulmonary hypertension: Patient seen this month with complaints of progressive dyspnea on exertion.  She does not experience chest pain.  I obtained echocardiogram that showed an EF of 60 to 65% with grade 1 diastolic dysfunction, normal RV function, RVSP 51 mmHg, moderate MR, mild to moderate TR, and aortic valve sclerosis.  In light of progressive dyspnea and evidence of  pulmonary hypertension, we called Ms. Jankowiak and asked her to come back to be reevaluated for consideration of diagnostic catheterization.  She notes ongoing dyspnea on exertion which is lifestyle limiting and since her last visit, has also noted a 2 pound weight gain and some increase in abdominal girth.  Today she also reports occasional ankle swelling.  We discussed her echocardiogram findings in detail.  Body habitus makes exam somewhat challenging but she is not particularly volume overloaded on examination.  In the setting of peripheral vascular and carotid arterial disease, I am concerned that her dyspnea may be an anginal equivalent.  In light of progressive symptoms and finding of pulmonary hypertension on echo, we agreed to pursue a right and left heart diagnostic catheterization.  I am adding Lasix  20 mg daily today.  She does have a history of hyponatremia on HCTZ and as such, we will follow-up lab work  early next week, prior to her diagnostic catheterization.  She otherwise remains on aspirin , clopidogrel , statin, beta-blocker, ARB, and calcium  channel blocker therapy. Informed Consent   Shared Decision Making/Informed Consent The risks, including but not limited to, [bleeding or vascular complications (1 in 500), pneumothorax (1 in 1600), arrhythmia (1 in 1000) and death (1 in 5000)], benefits (diagnostic support and/or management of heart failure, pulmonary hypertension) and alternatives of a right heart catheterization were discussed in detail with Ms. Yau and she is willing to proceed. The risks [stroke (1 in 1000), death (1 in 1000), kidney failure [usually temporary] (1 in 500), bleeding (1 in 200), allergic reaction [possibly serious] (1 in 200)], benefits (diagnostic support and management of coronary artery disease) and alternatives of a left heart cardiac catheterization were discussed in detail with Ms. Rynders and she is willing to proceed.      2.  Primary hypertension: Blood pressure elevated today 148/60.  Adding low-dose Lasix  as outlined above in the setting of elevated right heart pressures.  3.  Hyperlipidemia: LDL of 65 in December 2025 with normal LFTs.  Continue statin therapy.  4.  Moderate mitral regurgitation/aortic sclerosis: Recent echo showed slight progression of MR to moderate.  She also has aortic valve sclerosis without stenosis, which I suspect is the predominant cause of her loud systolic murmur.  She does have a softer blowing quality murmur at her apex as well.  5.  Peripheral arterial disease/carotid arterial disease: Status post bilateral lower extremity interventions.  Followed by vascular surgery with normal ABIs in December 2025.  In the setting of bilateral carotid bruits, I obtained a carotid ultrasound since her last visit, which did show moderate 40-59% bilateral internal carotid artery disease.  She has chronically been on dual antiplatelet therapy as  well as statin.  She will continue to follow-up with vascular surgery.  6.  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  Ms. Xin I discussed this today.  She believes that the surgery will be performed under general anesthesia but is not sure.  We discussed that in light of dyspnea exertion and pulmonary hypertension on recent echo, that surgery should be delayed until cardiac workup complete.  She plans to reach out to her podiatrist and I will copy my note to him as well.  7.  Disposition: Follow-up CBC and basic metabolic panel early next week.  Diagnostic catheterization next week with outpatient follow-up 2 weeks later.  Lonni Meager, NP 01/12/2025, 11:15 AM     [1]  Allergies Allergen  Reactions   Hydrochlorothiazide  Other (See Comments)    Hyponatremia    Adhesive [Tape] Rash    redness   "

## 2025-01-12 NOTE — Patient Instructions (Addendum)
 Medication Instructions:  Your physician recommends the following medication changes.   START TAKING: Lasix  20 mg daily.   *If you need a refill on your cardiac medications before your next appointment, please call your pharmacy*  Lab Work: Your provider would like for you to return next Wednesday 01/17/2025 to have the following labs drawn: BMET and CBC.   Please go to the [facility] at [facility]. Hours are 7 am to 7 pm.     Testing/Procedures:    Kristen Powell  01/12/2025  You are scheduled for a Cardiac Catheterization on Thursday, February 5 with Dr. Alm Clay.  1. Please arrive at the [facility] Entrance of [facility], [address], [place] [address] at 12:30 pm (This is 1 hour(s) prior to your procedure time).  Proceed to the Check-In Desk directly inside the entrance.  Procedure Parking: Use the entrance off of the [facility] [address] side of the hospital. Turn right upon entering and follow the driveway to parking that is directly in front of the [facility] Center. There is no valet parking available at this entrance, however there is an awning directly in front of the [facility] Vascular Center for drop off/ pick up for patients.  Special note: Every effort is made to have your procedure done on time. Please understand that emergencies sometimes delay scheduled procedures.  2. Diet: You may have a light breakfast but noting to eat after 6 am.    3. Hydration: You need to be well hydrated before your procedure. On February 5, you may drink approved liquids (see below) until 2 hours before the procedure, with 16 oz of water  as your last intake.   List of approved liquids water , clear juice, clear tea, black coffee, fruit juices, non-citric and without pulp, carbonated beverages, Gatorade, Kool -Aid, plain Jello-O and plain ice popsicles.  4. Labs: You will need to have blood drawn on Wednesday, February 4 at [facility], Go to 1st desk  on your right to register.  Address: [address] [place], [place] [address]  Open: 8am - 5pm  Phone: [phone]. You do not need to be fasting.  5. Medication instructions in preparation for your procedure: Hold the furosemide  the morning of the procedure.    Contrast Allergy: No   On the morning of your procedure, take your Aspirin  81 mg and Plavix /Clopidogrel  and any morning medicines NOT listed above.  You may use sips of water .  6. Plan to go home the same day, you will only stay overnight if medically necessary. 7. Bring a current list of your medications and current insurance cards. 8. You MUST have a responsible person to drive you home. 9. Someone MUST be with you the first 24 hours after you arrive home or your discharge will be delayed. 10. Please wear clothes that are easy to get on and off and wear slip-on shoes.  Thank you for allowing us  to care for you!   -- [facility]   Follow-Up: At [facility], you and your health needs are our priority.  As part of our continuing mission to provide you with exceptional heart care, our providers are all part of one team.  This team includes your primary Cardiologist (physician) and Advanced Practice Providers or APPs (Physician Assistants and Nurse Practitioners) who all work together to provide you with the care you need, when you need it.  Your next appointment:   3  week(s)  Provider:   Lonni Meager, NP

## 2025-01-16 ENCOUNTER — Telehealth: Payer: Self-pay | Admitting: Podiatry

## 2025-01-17 ENCOUNTER — Telehealth: Payer: Self-pay | Admitting: *Deleted

## 2025-01-17 ENCOUNTER — Ambulatory Visit: Payer: Self-pay | Admitting: Nurse Practitioner

## 2025-01-17 ENCOUNTER — Other Ambulatory Visit
Admission: RE | Admit: 2025-01-17 | Discharge: 2025-01-17 | Disposition: A | Source: Home / Self Care | Attending: Nurse Practitioner | Admitting: Nurse Practitioner

## 2025-01-17 DIAGNOSIS — E785 Hyperlipidemia, unspecified: Secondary | ICD-10-CM

## 2025-01-17 DIAGNOSIS — I340 Nonrheumatic mitral (valve) insufficiency: Secondary | ICD-10-CM

## 2025-01-17 DIAGNOSIS — R0989 Other specified symptoms and signs involving the circulatory and respiratory systems: Secondary | ICD-10-CM

## 2025-01-17 DIAGNOSIS — I10 Essential (primary) hypertension: Secondary | ICD-10-CM

## 2025-01-17 DIAGNOSIS — I70219 Atherosclerosis of native arteries of extremities with intermittent claudication, unspecified extremity: Secondary | ICD-10-CM

## 2025-01-17 LAB — BASIC METABOLIC PANEL WITH GFR
Anion gap: 13 (ref 5–15)
BUN: 22 mg/dL (ref 8–23)
CO2: 22 mmol/L (ref 22–32)
Calcium: 9.1 mg/dL (ref 8.9–10.3)
Chloride: 101 mmol/L (ref 98–111)
Creatinine, Ser: 0.99 mg/dL (ref 0.44–1.00)
GFR, Estimated: 58 mL/min — ABNORMAL LOW
Glucose, Bld: 94 mg/dL (ref 70–99)
Potassium: 5 mmol/L (ref 3.5–5.1)
Sodium: 136 mmol/L (ref 135–145)

## 2025-01-17 LAB — CBC
HCT: 32.9 % — ABNORMAL LOW (ref 36.0–46.0)
Hemoglobin: 10.7 g/dL — ABNORMAL LOW (ref 12.0–15.0)
MCH: 32 pg (ref 26.0–34.0)
MCHC: 32.5 g/dL (ref 30.0–36.0)
MCV: 98.5 fL (ref 80.0–100.0)
Platelets: 291 10*3/uL (ref 150–400)
RBC: 3.34 MIL/uL — ABNORMAL LOW (ref 3.87–5.11)
RDW: 13.1 % (ref 11.5–15.5)
WBC: 7.6 10*3/uL (ref 4.0–10.5)
nRBC: 0 % (ref 0.0–0.2)

## 2025-01-17 NOTE — Telephone Encounter (Signed)
 Cardiac Catheterization scheduled at [facility] for: Thursday January 18, 2025 1:30 PM Arrival time [facility] Entrance at: 12:30 PM [address] [place] [phone]  Diet:  -May have light meal until 7:30 AM. (6 hours before procedure time) Approved light meal consists of plain toast, fruit, light soups, crackers.  Hydration: -May drink clear liquids until 2 hours 11:30 AM before the procedure.  Approved liquids: Water , clear tea, black coffee, fruit juices-non-citric and without pulp,Gatorade, plain Jello/popsicles.   -Please drink 16 oz of water  2 hours before procedure.  Medication instructions: -Hold:  Lasix -AM of procedure -Other usual morning medications can be taken including aspirin  81 mg and Plavix  75 mg.  Plan to go home the same day, you will only stay overnight if medically necessary.  You must have responsible adult to drive you home.  Someone must be with you the first 24 hours after you arrive home.

## 2025-01-18 ENCOUNTER — Encounter: Admission: RE | Disposition: A | Payer: Self-pay | Source: Home / Self Care | Attending: Cardiovascular Disease

## 2025-01-18 ENCOUNTER — Encounter: Payer: Self-pay | Admitting: Cardiovascular Disease

## 2025-01-18 ENCOUNTER — Other Ambulatory Visit: Payer: Self-pay

## 2025-01-18 ENCOUNTER — Ambulatory Visit
Admission: RE | Admit: 2025-01-18 | Discharge: 2025-01-18 | Disposition: A | Attending: Cardiovascular Disease | Admitting: Cardiovascular Disease

## 2025-01-18 DIAGNOSIS — I739 Peripheral vascular disease, unspecified: Secondary | ICD-10-CM | POA: Insufficient documentation

## 2025-01-18 DIAGNOSIS — I119 Hypertensive heart disease without heart failure: Secondary | ICD-10-CM | POA: Insufficient documentation

## 2025-01-18 DIAGNOSIS — I2510 Atherosclerotic heart disease of native coronary artery without angina pectoris: Secondary | ICD-10-CM | POA: Insufficient documentation

## 2025-01-18 DIAGNOSIS — M19072 Primary osteoarthritis, left ankle and foot: Secondary | ICD-10-CM | POA: Insufficient documentation

## 2025-01-18 DIAGNOSIS — I080 Rheumatic disorders of both mitral and aortic valves: Secondary | ICD-10-CM | POA: Insufficient documentation

## 2025-01-18 DIAGNOSIS — R0609 Other forms of dyspnea: Secondary | ICD-10-CM

## 2025-01-18 DIAGNOSIS — I272 Pulmonary hypertension, unspecified: Secondary | ICD-10-CM

## 2025-01-18 DIAGNOSIS — F419 Anxiety disorder, unspecified: Secondary | ICD-10-CM | POA: Insufficient documentation

## 2025-01-18 DIAGNOSIS — Z7982 Long term (current) use of aspirin: Secondary | ICD-10-CM | POA: Insufficient documentation

## 2025-01-18 DIAGNOSIS — Z7902 Long term (current) use of antithrombotics/antiplatelets: Secondary | ICD-10-CM | POA: Insufficient documentation

## 2025-01-18 DIAGNOSIS — Z79899 Other long term (current) drug therapy: Secondary | ICD-10-CM | POA: Insufficient documentation

## 2025-01-18 DIAGNOSIS — R0600 Dyspnea, unspecified: Secondary | ICD-10-CM

## 2025-01-18 DIAGNOSIS — E785 Hyperlipidemia, unspecified: Secondary | ICD-10-CM | POA: Insufficient documentation

## 2025-01-18 LAB — POCT I-STAT EG7
Acid-base deficit: 2 mmol/L (ref 0.0–2.0)
Bicarbonate: 23.3 mmol/L (ref 20.0–28.0)
Calcium, Ion: 1.21 mmol/L (ref 1.15–1.40)
HCT: 33 % — ABNORMAL LOW (ref 36.0–46.0)
Hemoglobin: 11.2 g/dL — ABNORMAL LOW (ref 12.0–15.0)
O2 Saturation: 64 %
Potassium: 4.1 mmol/L (ref 3.5–5.1)
Sodium: 134 mmol/L — ABNORMAL LOW (ref 135–145)
TCO2: 24 mmol/L (ref 22–32)
pCO2, Ven: 39.9 mmHg — ABNORMAL LOW (ref 44–60)
pH, Ven: 7.374 (ref 7.25–7.43)
pO2, Ven: 34 mmHg (ref 32–45)

## 2025-01-18 LAB — POCT I-STAT 7, (LYTES, BLD GAS, ICA,H+H)
Acid-base deficit: 2 mmol/L (ref 0.0–2.0)
Bicarbonate: 22 mmol/L (ref 20.0–28.0)
Calcium, Ion: 1.2 mmol/L (ref 1.15–1.40)
HCT: 33 % — ABNORMAL LOW (ref 36.0–46.0)
Hemoglobin: 11.2 g/dL — ABNORMAL LOW (ref 12.0–15.0)
O2 Saturation: 93 %
Potassium: 4.1 mmol/L (ref 3.5–5.1)
Sodium: 134 mmol/L — ABNORMAL LOW (ref 135–145)
TCO2: 23 mmol/L (ref 22–32)
pCO2 arterial: 35.5 mmHg (ref 32–48)
pH, Arterial: 7.4 (ref 7.35–7.45)
pO2, Arterial: 66 mmHg — ABNORMAL LOW (ref 83–108)

## 2025-01-18 MED ORDER — SODIUM CHLORIDE 0.9% FLUSH: 3.0000 mL | Freq: Two times a day (BID) | INTRAVENOUS | Status: DC

## 2025-01-18 MED ORDER — MIDAZOLAM HCL (PF) 2 MG/2ML IJ SOLN
INTRAMUSCULAR | Status: DC | PRN
  Administered 2025-01-18: 1 mg via INTRAVENOUS

## 2025-01-18 MED ORDER — ONDANSETRON HCL 4 MG/2ML IJ SOLN: 4.0000 mg | Freq: Four times a day (QID) | INTRAMUSCULAR | Status: DC | PRN

## 2025-01-18 MED ORDER — LIDOCAINE HCL 1 % IJ SOLN
INTRAMUSCULAR | Status: AC
  Filled 2025-01-18: qty 20

## 2025-01-18 MED ORDER — LIDOCAINE HCL (PF) 1 % IJ SOLN
INTRAMUSCULAR | Status: DC | PRN
  Administered 2025-01-18 (×2): 5 mL

## 2025-01-18 MED ORDER — HEPARIN (PORCINE) IN NACL 1000-0.9 UT/500ML-% IV SOLN
INTRAVENOUS | Status: AC
  Filled 2025-01-18: qty 1000

## 2025-01-18 MED ORDER — FENTANYL CITRATE (PF) 100 MCG/2ML IJ SOLN
INTRAMUSCULAR | Status: AC
  Filled 2025-01-18: qty 2

## 2025-01-18 MED ORDER — SODIUM CHLORIDE 0.9 % IV SOLN
250.0000 mL | INTRAVENOUS | Status: DC | PRN
  Administered 2025-01-18: 250 mL via INTRAVENOUS

## 2025-01-18 MED ORDER — VERAPAMIL HCL 2.5 MG/ML IV SOLN
INTRAVENOUS | Status: DC | PRN
  Administered 2025-01-18: 5 mg via INTRAVENOUS

## 2025-01-18 MED ORDER — NITROGLYCERIN 1 MG/10 ML FOR IR/CATH LAB
INTRA_ARTERIAL | Status: AC
  Filled 2025-01-18: qty 10

## 2025-01-18 MED ORDER — HEPARIN (PORCINE) IN NACL 2000-0.9 UNIT/L-% IV SOLN
INTRAVENOUS | Status: DC | PRN
  Administered 2025-01-18: 1000 mL

## 2025-01-18 MED ORDER — ASPIRIN 81 MG PO CHEW: 81.0000 mg | CHEWABLE_TABLET | ORAL | Status: DC

## 2025-01-18 MED ORDER — NITROGLYCERIN 1 MG/10 ML FOR IR/CATH LAB
INTRA_ARTERIAL | Status: DC | PRN
  Administered 2025-01-18: 200 ug via INTRACORONARY

## 2025-01-18 MED ORDER — FENTANYL CITRATE (PF) 100 MCG/2ML IJ SOLN
INTRAMUSCULAR | Status: DC | PRN
  Administered 2025-01-18: 25 ug via INTRAVENOUS

## 2025-01-18 MED ORDER — FREE WATER: 500.0000 mL | Freq: Once | Status: DC

## 2025-01-18 MED ORDER — SODIUM CHLORIDE 0.9% FLUSH: 3.0000 mL | INTRAVENOUS | Status: DC | PRN

## 2025-01-18 MED ORDER — HEPARIN SODIUM (PORCINE) 1000 UNIT/ML IJ SOLN
INTRAMUSCULAR | Status: AC
  Filled 2025-01-18: qty 10

## 2025-01-18 MED ORDER — SODIUM CHLORIDE 0.9 % IV SOLN: 250.0000 mL | INTRAVENOUS | Status: DC | PRN

## 2025-01-18 MED ORDER — MIDAZOLAM HCL 2 MG/2ML IJ SOLN
INTRAMUSCULAR | Status: AC
  Filled 2025-01-18: qty 2

## 2025-01-18 MED ORDER — HEPARIN SODIUM (PORCINE) 1000 UNIT/ML IJ SOLN
INTRAMUSCULAR | Status: DC | PRN
  Administered 2025-01-18: 3500 [IU] via INTRAVENOUS

## 2025-01-18 MED ORDER — ACETAMINOPHEN 325 MG PO TABS: 650.0000 mg | ORAL_TABLET | ORAL | Status: DC | PRN

## 2025-01-18 MED ORDER — VERAPAMIL HCL 2.5 MG/ML IV SOLN
INTRAVENOUS | Status: AC
  Filled 2025-01-18: qty 2

## 2025-01-18 NOTE — Interval H&P Note (Signed)
 History and Physical Interval Note:  01/18/2025 2:55 PM  Kristen Powell  has presented today for surgery, with the diagnosis of R and L Cath    Dyspnea  Pulmonary hypertension.  The various methods of treatment have been discussed with the patient and family. After consideration of risks, benefits and other options for treatment, the patient has consented to  Procedures: RIGHT/LEFT HEART CATH AND CORONARY ANGIOGRAPHY (Bilateral) as a surgical intervention.  The patient's history has been reviewed, patient examined, no change in status, stable for surgery.  I have reviewed the patient's chart and labs.  Questions were answered to the patient's satisfaction.     [name]

## 2025-01-18 NOTE — Discharge Instructions (Signed)
 Radial Site Care Refer to this sheet in the next few weeks. These instructions provide you with information about caring for yourself after your procedure. Your health care provider may also give you more specific instructions. Your treatment has been planned according to current medical practices, but problems sometimes occur. Call your health care provider if you have any problems or questions after your procedure. What can I expect after the procedure? After your procedure, it is typical to have the following: Bruising at the radial site that usually fades within 1-2 weeks. Blood collecting in the tissue (hematoma) that may be painful to the touch. It should usually decrease in size and tenderness within 1-2 weeks.  Follow these instructions at home: Take medicines only as directed by your health care provider. If you are on a medication called Metformin please do not take for 48 hours after your procedure. Over the next 48hrs please increase your fluid intake of water and non caffeine beverages to flush the contrast dye out of your system.  You may shower 24 hours after the procedure  Leave your bandage on and gently wash the site with plain soap and water. Pat the area dry with a clean towel. Do not rub the site, because this may cause bleeding.  Remove your dressing 48hrs after your procedure and leave open to air.  Do not submerge your site in water for 7 days. This includes swimming and washing dishes.  Check your insertion site every day for redness, swelling, or drainage. Do not apply powder or lotion to the site. Do not flex or bend the affected arm for 24 hours or as directed by your health care provider. Do not push or pull heavy objects with the affected arm for 24 hours or as directed by your health care provider. Do not lift over 10 lb (4.5 kg) for 5 days after your procedure or as directed by your health care provider. Ask your health care provider when it is okay to: Return to  work or school. Resume usual physical activities or sports. Resume sexual activity. Do not drive home if you are discharged the same day as the procedure. Have someone else drive you. You may drive 48 hours after the procedure Do not operate machinery or power tools for 24 hours after the procedure. If your procedure was done as an outpatient procedure, which means that you went home the same day as your procedure, a responsible adult should be with you for the first 24 hours after you arrive home. Keep all follow-up visits as directed by your health care provider. This is important. Contact a health care provider if: You have a fever. You have chills. You have increased bleeding from the radial site. Hold pressure on the site. Get help right away if: You have unusual pain at the radial site. You have redness, warmth, or swelling at the radial site. You have drainage (other than a small amount of blood on the dressing) from the radial site. The radial site is bleeding, and the bleeding does not stop after 15 minutes of holding steady pressure on the site. Your arm or hand becomes pale, cool, tingly, or numb. This information is not intended to replace advice given to you by your health care provider. Make sure you discuss any questions you have with your health care provider. Document Released: 01/02/2011 Document Revised: 05/07/2016 Document Reviewed: 06/18/2014 [employer] Interactive Patient Education  2018 [employer].

## 2025-01-19 ENCOUNTER — Encounter: Payer: Self-pay | Admitting: Cardiovascular Disease

## 2025-01-19 MED ORDER — IOHEXOL 300 MG/ML  SOLN
INTRAMUSCULAR | Status: AC | PRN
  Administered 2025-01-18: 116 mL

## 2025-01-23 ENCOUNTER — Ambulatory Visit

## 2025-01-26 ENCOUNTER — Encounter: Admitting: Podiatry

## 2025-02-02 ENCOUNTER — Ambulatory Visit: Admitting: Nurse Practitioner

## 2025-02-09 ENCOUNTER — Encounter: Admitting: Podiatry

## 2025-02-20 ENCOUNTER — Ambulatory Visit

## 2025-02-23 ENCOUNTER — Encounter: Admitting: Podiatry

## 2025-03-22 ENCOUNTER — Ambulatory Visit: Admitting: Nurse Practitioner

## 2025-05-01 ENCOUNTER — Ambulatory Visit: Admitting: Family

## 2025-05-15 ENCOUNTER — Ambulatory Visit (INDEPENDENT_AMBULATORY_CARE_PROVIDER_SITE_OTHER): Admitting: Vascular Surgery

## 2025-05-15 ENCOUNTER — Encounter (INDEPENDENT_AMBULATORY_CARE_PROVIDER_SITE_OTHER)

## 2025-09-04 ENCOUNTER — Ambulatory Visit
